# Patient Record
Sex: Female | Born: 1960
Health system: Southern US, Community
[De-identification: ages and names within clinical notes are randomized; demographics above are authoritative.]

## PROBLEM LIST (undated history)

## (undated) DIAGNOSIS — K219 Gastro-esophageal reflux disease without esophagitis: Secondary | ICD-10-CM

## (undated) DIAGNOSIS — K449 Diaphragmatic hernia without obstruction or gangrene: Secondary | ICD-10-CM

## (undated) DIAGNOSIS — L309 Dermatitis, unspecified: Secondary | ICD-10-CM

## (undated) DIAGNOSIS — M1990 Unspecified osteoarthritis, unspecified site: Secondary | ICD-10-CM

## (undated) DIAGNOSIS — K269 Duodenal ulcer, unspecified as acute or chronic, without hemorrhage or perforation: Secondary | ICD-10-CM

## (undated) DIAGNOSIS — E039 Hypothyroidism, unspecified: Secondary | ICD-10-CM

## (undated) DIAGNOSIS — K2970 Gastritis, unspecified, without bleeding: Secondary | ICD-10-CM

## (undated) DIAGNOSIS — M797 Fibromyalgia: Secondary | ICD-10-CM

## (undated) DIAGNOSIS — Z1501 Genetic susceptibility to malignant neoplasm of breast: Secondary | ICD-10-CM

## (undated) DIAGNOSIS — Z87442 Personal history of urinary calculi: Secondary | ICD-10-CM

## (undated) DIAGNOSIS — R519 Headache, unspecified: Secondary | ICD-10-CM

## (undated) DIAGNOSIS — I10 Essential (primary) hypertension: Secondary | ICD-10-CM

## (undated) DIAGNOSIS — T8859XA Other complications of anesthesia, initial encounter: Secondary | ICD-10-CM

## (undated) DIAGNOSIS — L409 Psoriasis, unspecified: Secondary | ICD-10-CM

## (undated) DIAGNOSIS — I00 Rheumatic fever without heart involvement: Secondary | ICD-10-CM

## (undated) DIAGNOSIS — K589 Irritable bowel syndrome without diarrhea: Secondary | ICD-10-CM

## (undated) DIAGNOSIS — R011 Cardiac murmur, unspecified: Secondary | ICD-10-CM

## (undated) DIAGNOSIS — D649 Anemia, unspecified: Secondary | ICD-10-CM

## (undated) DIAGNOSIS — J45909 Unspecified asthma, uncomplicated: Secondary | ICD-10-CM

## (undated) DIAGNOSIS — G4733 Obstructive sleep apnea (adult) (pediatric): Secondary | ICD-10-CM

## (undated) DIAGNOSIS — K277 Chronic peptic ulcer, site unspecified, without hemorrhage or perforation: Secondary | ICD-10-CM

## (undated) DIAGNOSIS — J309 Allergic rhinitis, unspecified: Secondary | ICD-10-CM

## (undated) DIAGNOSIS — Z803 Family history of malignant neoplasm of breast: Secondary | ICD-10-CM

## (undated) DIAGNOSIS — M719 Bursopathy, unspecified: Secondary | ICD-10-CM

## (undated) DIAGNOSIS — C4491 Basal cell carcinoma of skin, unspecified: Secondary | ICD-10-CM

## (undated) DIAGNOSIS — T4145XA Adverse effect of unspecified anesthetic, initial encounter: Secondary | ICD-10-CM

## (undated) DIAGNOSIS — E119 Type 2 diabetes mellitus without complications: Secondary | ICD-10-CM

## (undated) HISTORY — PX: SKIN BIOPSY: SHX1

## (undated) HISTORY — DX: Basal cell carcinoma of skin, unspecified: C44.91

## (undated) HISTORY — DX: Hypothyroidism, unspecified: E03.9

## (undated) HISTORY — DX: Psoriasis, unspecified: L40.9

## (undated) HISTORY — PX: TONSILLECTOMY AND ADENOIDECTOMY: SUR1326

## (undated) HISTORY — DX: Obstructive sleep apnea (adult) (pediatric): G47.33

## (undated) HISTORY — DX: Gastro-esophageal reflux disease without esophagitis: K21.9

## (undated) HISTORY — DX: Duodenal ulcer, unspecified as acute or chronic, without hemorrhage or perforation: K26.9

## (undated) HISTORY — DX: Unspecified osteoarthritis, unspecified site: M19.90

## (undated) HISTORY — DX: Fibromyalgia: M79.7

## (undated) HISTORY — DX: Gastritis, unspecified, without bleeding: K29.70

## (undated) HISTORY — DX: Unspecified asthma, uncomplicated: J45.909

## (undated) HISTORY — DX: Diaphragmatic hernia without obstruction or gangrene: K44.9

## (undated) HISTORY — DX: Irritable bowel syndrome, unspecified: K58.9

## (undated) HISTORY — DX: Allergic rhinitis, unspecified: J30.9

## (undated) HISTORY — PX: SKIN CANCER EXCISION: SHX779

## (undated) HISTORY — DX: Essential (primary) hypertension: I10

## (undated) HISTORY — PX: APPENDECTOMY: SHX54

## (undated) HISTORY — DX: Bursopathy, unspecified: M71.9

## (undated) HISTORY — PX: CHOLECYSTECTOMY: SHX55

## (undated) HISTORY — PX: TONSILECTOMY, ADENOIDECTOMY, BILATERAL MYRINGOTOMY AND TUBES: SHX2538

## (undated) HISTORY — DX: Family history of malignant neoplasm of breast: Z80.3

---
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
## 1997-07-10 DIAGNOSIS — C4491 Basal cell carcinoma of skin, unspecified: Secondary | ICD-10-CM

## 1997-07-10 HISTORY — DX: Basal cell carcinoma of skin, unspecified: C44.91

## 2005-01-21 ENCOUNTER — Ambulatory Visit: Payer: Self-pay | Admitting: Family Medicine

## 2005-02-03 ENCOUNTER — Ambulatory Visit: Payer: Self-pay | Admitting: Family Medicine

## 2005-06-09 HISTORY — PX: BREAST BIOPSY: SHX20

## 2005-07-16 ENCOUNTER — Ambulatory Visit: Payer: Self-pay | Admitting: General Surgery

## 2006-02-10 ENCOUNTER — Ambulatory Visit: Payer: Self-pay | Admitting: General Surgery

## 2006-11-08 ENCOUNTER — Inpatient Hospital Stay: Payer: Self-pay | Admitting: Internal Medicine

## 2007-06-10 DIAGNOSIS — J189 Pneumonia, unspecified organism: Secondary | ICD-10-CM

## 2007-06-10 HISTORY — DX: Pneumonia, unspecified organism: J18.9

## 2008-03-21 ENCOUNTER — Ambulatory Visit: Payer: Self-pay | Admitting: Obstetrics and Gynecology

## 2009-04-02 ENCOUNTER — Ambulatory Visit: Payer: Self-pay | Admitting: Internal Medicine

## 2009-04-30 ENCOUNTER — Ambulatory Visit: Payer: Self-pay | Admitting: Internal Medicine

## 2009-05-09 ENCOUNTER — Ambulatory Visit: Payer: Self-pay | Admitting: Internal Medicine

## 2009-07-02 ENCOUNTER — Emergency Department (HOSPITAL_COMMUNITY): Admission: EM | Admit: 2009-07-02 | Discharge: 2009-07-03 | Payer: Self-pay | Admitting: Emergency Medicine

## 2009-07-04 ENCOUNTER — Ambulatory Visit: Payer: Self-pay | Admitting: Internal Medicine

## 2010-08-26 LAB — DIFFERENTIAL
Basophils Relative: 1 % (ref 0–1)
Eosinophils Absolute: 0.1 10*3/uL (ref 0.0–0.7)
Neutrophils Relative %: 74 % (ref 43–77)

## 2010-08-26 LAB — URINALYSIS, ROUTINE W REFLEX MICROSCOPIC
Ketones, ur: NEGATIVE mg/dL
Nitrite: NEGATIVE
Protein, ur: 100 mg/dL — AB
Specific Gravity, Urine: 1.018 (ref 1.005–1.030)

## 2010-08-26 LAB — POCT I-STAT, CHEM 8
BUN: 9 mg/dL (ref 6–23)
Hemoglobin: 13.6 g/dL (ref 12.0–15.0)
Potassium: 3.7 mEq/L (ref 3.5–5.1)
Sodium: 136 mEq/L (ref 135–145)
TCO2: 28 mmol/L (ref 0–100)

## 2010-08-26 LAB — URINE CULTURE

## 2010-08-26 LAB — CBC
MCV: 92.8 fL (ref 78.0–100.0)
Platelets: 381 10*3/uL (ref 150–400)
WBC: 17.2 10*3/uL — ABNORMAL HIGH (ref 4.0–10.5)

## 2010-08-26 LAB — URINE MICROSCOPIC-ADD ON

## 2011-04-28 ENCOUNTER — Ambulatory Visit: Payer: Self-pay | Admitting: Internal Medicine

## 2011-06-11 LAB — HM MAMMOGRAPHY: HM MAMMO: NORMAL

## 2011-07-25 ENCOUNTER — Ambulatory Visit: Payer: Self-pay | Admitting: Unknown Physician Specialty

## 2011-09-08 DIAGNOSIS — J3089 Other allergic rhinitis: Secondary | ICD-10-CM | POA: Insufficient documentation

## 2011-09-09 ENCOUNTER — Ambulatory Visit: Payer: Self-pay | Admitting: Internal Medicine

## 2011-09-30 DIAGNOSIS — L309 Dermatitis, unspecified: Secondary | ICD-10-CM

## 2011-09-30 HISTORY — DX: Dermatitis, unspecified: L30.9

## 2011-11-05 ENCOUNTER — Ambulatory Visit: Payer: Self-pay | Admitting: Internal Medicine

## 2012-02-23 ENCOUNTER — Inpatient Hospital Stay: Payer: Self-pay | Admitting: Internal Medicine

## 2012-02-23 LAB — COMPREHENSIVE METABOLIC PANEL
Anion Gap: 9 (ref 7–16)
Calcium, Total: 9.3 mg/dL (ref 8.5–10.1)
Chloride: 101 mmol/L (ref 98–107)
EGFR (African American): >60
Osmolality: 274 (ref 275–301)
Potassium: 3.7 mmol/L (ref 3.5–5.1)
SGOT(AST): 17 U/L (ref 15–37)
Sodium: 137 mmol/L (ref 136–145)

## 2012-02-23 LAB — CBC
HCT: 40.8 % (ref 35.0–47.0)
MCH: 31.8 pg (ref 26.0–34.0)
MCV: 92 fL (ref 80–100)
RBC: 4.42 10*6/uL (ref 3.80–5.20)
WBC: 17.7 10*3/uL — ABNORMAL HIGH (ref 3.6–11.0)

## 2012-02-23 LAB — URINALYSIS, COMPLETE
Glucose,UR: NEGATIVE mg/dL (ref 0–75)
Ketone: NEGATIVE
Specific Gravity: 1.005 (ref 1.003–1.030)
Squamous Epithelial: <1
WBC UR: 279 /HPF (ref 0–5)

## 2012-02-23 LAB — TSH: Thyroid Stimulating Horm: 3.38 u[IU]/mL

## 2012-02-24 LAB — BASIC METABOLIC PANEL
Calcium, Total: 8.2 mg/dL — ABNORMAL LOW (ref 8.5–10.1)
EGFR (African American): >60
EGFR (Non-African Amer.): >60
Osmolality: 280 (ref 275–301)
Sodium: 141 mmol/L (ref 136–145)

## 2012-02-24 LAB — CBC WITH DIFFERENTIAL/PLATELET
Basophil %: 1.1 %
Eosinophil %: 2.1 %
HCT: 36.2 % (ref 35.0–47.0)
HGB: 12.3 g/dL (ref 12.0–16.0)
Lymphocyte #: 3.8 10*3/uL — ABNORMAL HIGH (ref 1.0–3.6)
MCV: 94 fL (ref 80–100)
Monocyte %: 5.6 %
Neutrophil #: 9.2 10*3/uL — ABNORMAL HIGH (ref 1.4–6.5)
RBC: 3.85 10*6/uL (ref 3.80–5.20)
RDW: 13.5 % (ref 11.5–14.5)
WBC: 14.3 10*3/uL — ABNORMAL HIGH (ref 3.6–11.0)

## 2012-02-25 LAB — BASIC METABOLIC PANEL
Anion Gap: 7 (ref 7–16)
BUN: 5 mg/dL — ABNORMAL LOW (ref 7–18)
Chloride: 106 mmol/L (ref 98–107)
Co2: 28 mmol/L (ref 21–32)
EGFR (Non-African Amer.): >60
Osmolality: 279 (ref 275–301)
Potassium: 4.1 mmol/L (ref 3.5–5.1)

## 2012-02-25 LAB — CBC WITH DIFFERENTIAL/PLATELET
Basophil #: 0.1 10*3/uL (ref 0.0–0.1)
Eosinophil #: 0.4 10*3/uL (ref 0.0–0.7)
Lymphocyte #: 4 10*3/uL — ABNORMAL HIGH (ref 1.0–3.6)
MCH: 30.9 pg (ref 26.0–34.0)
MCHC: 32.9 g/dL (ref 32.0–36.0)
MCV: 94 fL (ref 80–100)
Monocyte #: 0.5 x10 3/mm (ref 0.2–0.9)
Platelet: 316 10*3/uL (ref 150–440)
RDW: 13.7 % (ref 11.5–14.5)
WBC: 9.5 10*3/uL (ref 3.6–11.0)

## 2012-02-26 LAB — URINE CULTURE

## 2012-04-14 ENCOUNTER — Other Ambulatory Visit: Payer: Self-pay | Admitting: Internal Medicine

## 2012-04-14 LAB — CBC WITH DIFFERENTIAL/PLATELET
Basophil %: 1 %
Eosinophil #: 0.5 10*3/uL (ref 0.0–0.7)
Eosinophil %: 4.2 %
HCT: 40.5 % (ref 35.0–47.0)
HGB: 13.4 g/dL (ref 12.0–16.0)
Lymphocyte %: 32.7 %
MCH: 31 pg (ref 26.0–34.0)
MCHC: 33.1 g/dL (ref 32.0–36.0)
MCV: 94 fL (ref 80–100)
Neutrophil %: 57.8 %
Platelet: 400 10*3/uL (ref 150–440)
RBC: 4.33 10*6/uL (ref 3.80–5.20)
WBC: 11.5 10*3/uL — ABNORMAL HIGH (ref 3.6–11.0)

## 2012-04-14 LAB — LIPID PANEL
Cholesterol: 187 mg/dL (ref 0–200)
HDL Cholesterol: 51 mg/dL (ref 40–60)
Triglycerides: 171 mg/dL (ref 0–200)
VLDL Cholesterol, Calc: 34 mg/dL (ref 5–40)

## 2012-06-10 LAB — HM COLONOSCOPY: HM COLON: NORMAL

## 2012-07-09 ENCOUNTER — Ambulatory Visit: Payer: Self-pay | Admitting: Internal Medicine

## 2012-07-19 DIAGNOSIS — R079 Chest pain, unspecified: Secondary | ICD-10-CM | POA: Insufficient documentation

## 2012-07-19 HISTORY — DX: Chest pain, unspecified: R07.9

## 2013-03-07 ENCOUNTER — Emergency Department: Payer: Self-pay | Admitting: Emergency Medicine

## 2013-03-07 LAB — HEPATIC FUNCTION PANEL A (ARMC)
SGOT(AST): 21 U/L (ref 15–37)
SGPT (ALT): 26 U/L (ref 12–78)
Total Protein: 7.6 g/dL (ref 6.4–8.2)

## 2013-03-07 LAB — BASIC METABOLIC PANEL
Anion Gap: 5 — ABNORMAL LOW (ref 7–16)
BUN: 8 mg/dL (ref 7–18)
Calcium, Total: 9 mg/dL (ref 8.5–10.1)
Creatinine: 0.68 mg/dL (ref 0.60–1.30)
EGFR (African American): >60
Glucose: 131 mg/dL — ABNORMAL HIGH (ref 65–99)
Potassium: 3.8 mmol/L (ref 3.5–5.1)

## 2013-03-07 LAB — URINALYSIS, COMPLETE
Bilirubin,UR: NEGATIVE
Ketone: NEGATIVE
Leukocyte Esterase: NEGATIVE
Ph: 6 (ref 4.5–8.0)
Protein: NEGATIVE
RBC,UR: <1 /HPF (ref 0–5)
Specific Gravity: 1.002 (ref 1.003–1.030)
WBC UR: 1 /HPF (ref 0–5)

## 2013-03-07 LAB — CBC
HCT: 44 % (ref 35.0–47.0)
MCH: 31.4 pg (ref 26.0–34.0)
MCHC: 33.7 g/dL (ref 32.0–36.0)
Platelet: 376 10*3/uL (ref 150–440)
RBC: 4.73 10*6/uL (ref 3.80–5.20)
RDW: 13.7 % (ref 11.5–14.5)

## 2013-03-07 LAB — LIPASE, BLOOD: Lipase: 82 U/L (ref 73–393)

## 2013-08-21 ENCOUNTER — Emergency Department: Payer: Self-pay | Admitting: Emergency Medicine

## 2013-08-21 LAB — CBC
HCT: 42.5 % (ref 35.0–47.0)
HGB: 13.6 g/dL (ref 12.0–16.0)
MCH: 30.4 pg (ref 26.0–34.0)
MCHC: 32.1 g/dL (ref 32.0–36.0)
MCV: 95 fL (ref 80–100)
Platelet: 362 10*3/uL (ref 150–440)
RBC: 4.48 10*6/uL (ref 3.80–5.20)
RDW: 13.8 % (ref 11.5–14.5)
WBC: 11.1 10*3/uL — ABNORMAL HIGH (ref 3.6–11.0)

## 2013-08-21 LAB — BASIC METABOLIC PANEL
ANION GAP: 4 — AB (ref 7–16)
BUN: 7 mg/dL (ref 7–18)
CO2: 30 mmol/L (ref 21–32)
Calcium, Total: 8.7 mg/dL (ref 8.5–10.1)
Chloride: 104 mmol/L (ref 98–107)
Creatinine: 0.66 mg/dL (ref 0.60–1.30)
EGFR (African American): >60
GLUCOSE: 133 mg/dL — AB (ref 65–99)
OSMOLALITY: 276 (ref 275–301)
POTASSIUM: 3.7 mmol/L (ref 3.5–5.1)
SODIUM: 138 mmol/L (ref 136–145)

## 2013-08-21 LAB — TROPONIN I

## 2013-08-21 LAB — PRO B NATRIURETIC PEPTIDE: B-TYPE NATIURETIC PEPTID: 34 pg/mL (ref 0–125)

## 2014-05-19 LAB — LIPID PANEL
CHOLESTEROL: 205 mg/dL — AB (ref 0–200)
HDL: 50 mg/dL (ref 35–70)
LDL CALC: 117 mg/dL
TRIGLYCERIDES: 188 mg/dL — AB (ref 40–160)

## 2014-05-19 LAB — TSH: TSH: 5.09 u[IU]/mL (ref <=5.90)

## 2014-09-26 NOTE — Discharge Summary (Signed)
PATIENT NAME:  [name], [name] MR#:  [id number] DATE OF BIRTH:  May 27, 1961  DATE OF ADMISSION:  02/23/2012 DATE OF DISCHARGE:  02/26/2012  DISCHARGE DIAGNOSES:  1. Systemic inflammatory response syndrome, possibly due to acute pyelonephritis with urine culture growing Escherichia coli.  2. Escherichia coli urinary tract infection, improving on antibiotic.   SECONDARY DIAGNOSES:  1. History of diverticulosis.  2. Hiatal hernia.  3. Fibromyalgia.  4. Chronic pain syndrome. 5. Acid reflux.  6. Hypertension.  7. Hypothyroidism.  8. History of insomnia.  9. Asthma.  10. Morbid obesity.   CONSULTATIONS: None.   PROCEDURES/RADIOLOGY: None pending.   MAJOR LABORATORY PANEL: UA on admission showed 3+ bacteria, 279 WBCs, 3+ leukocyte esterase, positive nitrite, WBCs in clumps present.   Urine culture grew more than 100,000 colonies of Escherichia coli.   HISTORY AND SHORT HOSPITAL COURSE: Patient is a 54 year old female with above-mentioned medical problems who was admitted for SIRS thought to be secondary to acute pyelonephritis, was started on IV antibiotics. Please see Dr. Gus Height Patel's dictated History and Physical for further details. The patient was slowly improving on IV antibiotics. Did not have any further fever. Her white count had also normalized, and she was close to baseline and was discharged home in stable condition.    PERTINENT PHYSICAL EXAMINATION ON THE DATE OF DISCHARGE:   VITAL SIGNS: On the date of discharge her vital signs were as follows: Temperature 98, heart rate 87 per minute, respirations 20 per minute, blood pressure 123/76 mmHg. She was saturating 95% on room air.   CARDIOVASCULAR: S1, S2 normal. No murmurs, rubs, or gallop.   LUNGS: Clear to auscultation bilaterally. No wheezing, rales, rhonchi, crepitation.    ABDOMEN: Soft, benign.   NEUROLOGIC: Nonfocal examination. All other physical examination remained at baseline.   DISCHARGE MEDICATIONS:   1. Lisinopril 20 mg p.o. daily.  2. Levothyroxine 125 mcg p.o. daily.  3. Zyrtec once daily.  4. Vectical topical ointment to affected area twice a day.  5. Amlodipine 5 mg p.o. daily. 6. Aspirin 81 mg p.o. daily.  7. Flonase one spray to each nostril once a day.  8. Tramadol 50 mg p.o. every 4 hours as needed.  9. Nexium 40 mg p.o. daily.  10. Singulair once daily.  11. Amitriptyline 50 mg p.o. at bedtime. 12. FiberChoice once daily.  13. Flexeril 10 mg p.o. b.i.d.  14. NasalCrom 1 spray to nose 1 to 2 times a day. 15. Loperamide 2 mg p.o. daily as needed.  16. ProAir 2 puffs inhaled as needed.  17. Lasix 20 mg half tablet p.o. daily as needed.  18. Multivitamin once daily. 19. Elavil 50 mg p.o. at bedtime.  20. Cipro 500 mg p.o. b.i.d. for 10 days.   DISCHARGE DIET: Regular.   DISCHARGE ACTIVITY: As tolerated.   DISCHARGE INSTRUCTIONS AND FOLLOW-UP: Patient was instructed to follow up with her primary care physician, Dr. Valentino Saxon, in 1 to 2 weeks.   TOTAL TIME DISCHARGING THIS PATIENT: 55 minutes.     ____________________________ Lucina Mellow. Manuella Ghazi, MD vss:vtd D: 03/01/2012 10:08:30 ET T: 03/02/2012 13:29:48 ET JOB#: [id number]  cc: Hulda Reddix S. Manuella Ghazi, MD, <Dictator> Maureen P. Heber [place], MD Remer Macho MD ELECTRONICALLY SIGNED 03/02/2012 14:20

## 2014-09-26 NOTE — H&P (Signed)
PATIENT NAME:  Summer, ARALY KAAS MR#:  [id number] DATE OF BIRTH:  06/24/1960  DATE OF ADMISSION:  02/23/2012  PRIMARY CARE PHYSICIAN: Valentino Saxon, MD   CHIEF COMPLAINT: Urinary frequency, urgency, suprapubic abdominal pain, nausea, and fever for three days.   HISTORY OF PRESENT ILLNESS: Ms. Purewal is a 54 year old Caucasian female with history of fibromyalgia, hypothyroidism, hypertension, acid reflux, and asthma who presents to the Emergency Room with fever at home. She was found to be tachycardic with heart rate in the 115's, was very nauseous and had significant abdominal pain. Her urine looked very cloudy with positive WBCs, leukocyte esterase, and nitrites. She is being admitted for acute pyelonephritis. The patient has significant nausea, retching on arrival to the Emergency Room. She received 1 gram of Rocephin, IV Dilaudid, and IV Zofran. The patient is being admitted for further evaluation and management. Her white count is 17,000.   PAST MEDICAL HISTORY:  1. History of diverticulosis by colonoscopy done in February of 2013.  2. Hiatus hernia with gastritis noted on EGD again in February of 2013.  3. Fibromyalgia.  4. Chronic pain syndrome.  5. Acid reflux/gastritis.  6. Hypertension.  7. Hypothyroidism.  8. History of insomnia.  9. Asthma.  10. Morbid obesity.   HOME MEDICATIONS:  1. Zyrtec Liquid Gels 10 mg 1 p.o. daily.  2. Vectical 3 mcg/g topical ointment apply to affected area b.i.d.  3. Tramadol 50 mg 1 tablet q.4 p.r.n.  4. Singulair 10 mg once a day in the evening.  5. ProAir HFA 2 puffs as needed p.r.n.  6. Nexium 40 mg p.o. daily.  7. NasalCrom 5.2 mg/inhalation 1 to 2 times.  8. Multivitamin 1 tablet once a day.  9. Loperamide 2 mg 1 capsule as needed.  10. Lisinopril 20 mg once a day.  11. Levothyroxine 125 mcg one tablet once a day.  12. Lasix 20 mg half a tablet daily as needed.  13. Fluticasone 50 mcg/inhalation one spray nasally once a day.   14. FiberChoice 1 capsule daily.  15. Cyclobenzaprine 10 mg b.i.d.  16. Aspirin 81 mg daily.  17. Amlodipine 5 mg daily.  18. Amitriptyline 50 mg daily at bedtime.    ALLERGIES: Hydrochlorothiazide and sulfa.   FAMILY HISTORY: Positive for kidney stones in father.   SOCIAL HISTORY: Does not work. Is in the process of applying for disability. Nonsmoker. Nonalcoholic.   REVIEW OF SYSTEMS: CONSTITUTIONAL: Positive for fatigue and weakness and low-grade fever at home. EYES: No blurred or double vision. No cataracts. ENT: No tinnitus, ear pain, hearing loss. RESPIRATORY: No cough, wheeze, hemoptysis. CARDIOVASCULAR: No chest pain, orthopnea, edema, arrhythmias. GI: Positive for nausea and abdominal pain. Positive for gastroesophageal reflux disease. GU: Positive for dysuria, frequency, and urgency. ENDOCRINE: No polyuria, nocturia, or thyroid problems. HEMATOLOGY: No anemia or easy bruising. SKIN: No acne or rash. MUSCULOSKELETAL: Positive for arthritis and fibromyalgia. NEUROLOGIC: No CVA or TIA. PSYCH: No anxiety or depression. All other systems reviewed and negative.   PHYSICAL EXAMINATION:   GENERAL: The patient is awake, alert, oriented x3 not in acute distress.   VITAL SIGNS: Afebrile, pulse 112 to 115, respirations 16, blood pressure 126/79, pulse oximetry 92% on room air.   HEENT: Atraumatic, normocephalic. Pupils equal, round, and reactive to light and accommodation. Extraocular movements intact. Oral mucosa is moist.   NECK: Supple. No JVD. No carotid bruit.   RESPIRATORY: Clear to auscultation bilaterally. No rales, rhonchi, respiratory distress, or labored breathing.   CARDIOVASCULAR: Both the heart  sounds are normal. Tachycardia present. No murmur heard. PMI not lateralized. Chest nontender. Good peripheral pulses. Good femoral pulses. No lower extremity edema.   ABDOMEN: Soft, benign. There is some tenderness present in the right and left lower quadrants. No guarding, rigidity,  or mass felt. Bowel sounds active at present.   NEUROLOGIC: Grossly intact cranial nerves II through XII. No motor or sensory deficit.   EXTREMITIES: Good pedal pulses. Good femoral pulses. No lower extremity edema.   SKIN: Warm and dry.   LABORATORY, DIAGNOSTIC, AND RADIOLOGICAL DATA: White count 17.7, hemoglobin and hematocrit 14.1 and 40.8, platelet count 438, glucose 137. Rest of the chemistries within normal limits. TSH 3.38. Urinalysis positive for UTI.   ASSESSMENT: 54 year old Ms. Coakley with history of hypertension, fibromyalgia, chronic pain syndrome, and hypothyroidism who presented with urgency, urine discoloration, abdominal pain and nausea with low-grade fever admitted with:  1. SIRS suspected due to acute pyelonephritis. The patient presented with low-grade fever, tachycardia, abdominal pain, leukocytosis, and significant nausea in the Emergency Room. She received IV Rocephin, Dilaudid, and IV Zofran. Will admit patient to the medical floor. Continue IV antibiotics. Follow blood culture, urine culture. Change antibiotics according to the culture and sensitivity. Continue p.r.n. Dilaudid and p.r.n. Zofran. Follow-up CBC.  2. Fibromyalgia with chronic pain. Continue the patient's home meds which is Elavil, p.r.n. tramadol, and Flexeril.  3. Leukocytosis secondary to SIRS due to pyelonephritis. Follow-up clinically and follow-up CBC as well.  4. Hypothyroidism. Continue Synthroid.  5. Hypertension. Will continue amlodipine and lisinopril.  6. DVT prophylaxis with DVT prophylaxis with Lovenox daily.  7. GERD/acid reflux. Continue Nexium daily.   Further work-up according to the patient's clinical course. Hospital admission plan was discussed with the patient who is agreeable to it. No family members present.  CODE STATUS: The patient is a FULL CODE.   TIME SPENT: 45 minutes.   ____________________________ Hart Rochester Posey Pronto, MD sap:drc D: 02/23/2012 22:05:02 ET T: 02/24/2012  07:02:11 ET JOB#: [id number]  cc: Alfonsa Vaile A. Posey Pronto, MD, <Dictator> Maureen P. Heber [place], MD Ilda Basset MD ELECTRONICALLY SIGNED 02/26/2012 13:24

## 2014-10-27 ENCOUNTER — Other Ambulatory Visit: Payer: Self-pay | Admitting: Internal Medicine

## 2014-10-27 DIAGNOSIS — Z1231 Encounter for screening mammogram for malignant neoplasm of breast: Secondary | ICD-10-CM

## 2014-11-09 ENCOUNTER — Ambulatory Visit
Admission: RE | Admit: 2014-11-09 | Discharge: 2014-11-09 | Disposition: A | Discharge status: Home / Self Care | Payer: 59 | Source: Ambulatory Visit | Attending: Internal Medicine | Admitting: Internal Medicine

## 2014-11-09 DIAGNOSIS — R922 Inconclusive mammogram: Secondary | ICD-10-CM | POA: Diagnosis not present

## 2014-11-09 DIAGNOSIS — Z1231 Encounter for screening mammogram for malignant neoplasm of breast: Secondary | ICD-10-CM | POA: Diagnosis not present

## 2014-11-10 ENCOUNTER — Encounter: Payer: Self-pay | Admitting: Internal Medicine

## 2014-11-10 ENCOUNTER — Other Ambulatory Visit: Payer: Self-pay | Admitting: Internal Medicine

## 2014-11-10 DIAGNOSIS — E039 Hypothyroidism, unspecified: Secondary | ICD-10-CM | POA: Insufficient documentation

## 2014-11-10 DIAGNOSIS — E782 Mixed hyperlipidemia: Secondary | ICD-10-CM | POA: Insufficient documentation

## 2014-11-10 DIAGNOSIS — J4520 Mild intermittent asthma, uncomplicated: Secondary | ICD-10-CM | POA: Insufficient documentation

## 2014-11-10 DIAGNOSIS — M797 Fibromyalgia: Secondary | ICD-10-CM | POA: Insufficient documentation

## 2014-11-10 DIAGNOSIS — G4733 Obstructive sleep apnea (adult) (pediatric): Secondary | ICD-10-CM | POA: Insufficient documentation

## 2014-11-10 DIAGNOSIS — K277 Chronic peptic ulcer, site unspecified, without hemorrhage or perforation: Secondary | ICD-10-CM | POA: Insufficient documentation

## 2014-11-10 DIAGNOSIS — L409 Psoriasis, unspecified: Secondary | ICD-10-CM | POA: Insufficient documentation

## 2014-11-10 DIAGNOSIS — Z6841 Body Mass Index (BMI) 40.0 and over, adult: Secondary | ICD-10-CM | POA: Insufficient documentation

## 2014-11-10 DIAGNOSIS — K580 Irritable bowel syndrome with diarrhea: Secondary | ICD-10-CM | POA: Insufficient documentation

## 2014-11-10 DIAGNOSIS — I10 Essential (primary) hypertension: Secondary | ICD-10-CM | POA: Insufficient documentation

## 2015-01-23 ENCOUNTER — Telehealth: Payer: Self-pay

## 2015-01-23 ENCOUNTER — Other Ambulatory Visit: Payer: Self-pay

## 2015-01-23 NOTE — Telephone Encounter (Signed)
Patient needs refill on Cymbalta and should be 30mg  3 tablets daily, you increased it to 90mg  but sent only one a day, needs 90 days to [facility]. Also needs ProAir HFA inhaler sent.  dr

## 2015-01-24 ENCOUNTER — Other Ambulatory Visit: Payer: Self-pay | Admitting: Internal Medicine

## 2015-01-24 MED ORDER — DULOXETINE HCL 30 MG PO CPEP: 90.0000 mg | ORAL_CAPSULE | Freq: Every day | ORAL | Status: DC

## 2015-01-24 MED ORDER — ALBUTEROL SULFATE HFA 108 (90 BASE) MCG/ACT IN AERS: 2.0000 | INHALATION_SPRAY | Freq: Four times a day (QID) | RESPIRATORY_TRACT | Status: DC | PRN

## 2015-03-19 ENCOUNTER — Ambulatory Visit (INDEPENDENT_AMBULATORY_CARE_PROVIDER_SITE_OTHER): Payer: 59 | Admitting: Internal Medicine

## 2015-03-19 ENCOUNTER — Encounter: Payer: Self-pay | Admitting: Internal Medicine

## 2015-03-19 VITALS — BP 150/92 | HR 80 | Ht 61.0 in | Wt 238.4 lb

## 2015-03-19 DIAGNOSIS — M791 Myalgia: Secondary | ICD-10-CM

## 2015-03-19 DIAGNOSIS — IMO0001 Reserved for inherently not codable concepts without codable children: Secondary | ICD-10-CM

## 2015-03-19 DIAGNOSIS — I10 Essential (primary) hypertension: Secondary | ICD-10-CM

## 2015-03-19 DIAGNOSIS — M609 Myositis, unspecified: Secondary | ICD-10-CM

## 2015-03-19 DIAGNOSIS — Z23 Encounter for immunization: Secondary | ICD-10-CM | POA: Diagnosis not present

## 2015-03-19 DIAGNOSIS — S2001XA Contusion of right breast, initial encounter: Secondary | ICD-10-CM

## 2015-03-19 DIAGNOSIS — K580 Irritable bowel syndrome with diarrhea: Secondary | ICD-10-CM | POA: Diagnosis not present

## 2015-03-19 MED ORDER — DULOXETINE HCL 30 MG PO CPEP: 90.0000 mg | ORAL_CAPSULE | Freq: Every day | ORAL | Status: DC

## 2015-03-19 MED ORDER — CYCLOBENZAPRINE HCL 5 MG PO TABS: 15.0000 mg | ORAL_TABLET | Freq: Two times a day (BID) | ORAL | Status: DC

## 2015-03-19 NOTE — Progress Notes (Signed)
Date:  03/19/2015   Name:  [name]   DOB:  1960-09-03   MRN:  [id number]   Chief Complaint: Breast Problem HPI  Patient reports 3 weeks ago her cat jumped onto her right breast. The next day with a hematoma about the size of a silver dollar. In addition she felt a mass under the hematoma. The hematoma has now resolved but the mass is persistent. She denies pain, increase in mass size, nipple discharge, or skin change. Her last mammogram was in June and was normal. Her mother had breast cancer. IBS with diarrhea -this is been a long-standing issue that was significantly impacting her quality of life. Last visit she was prescribed cholestyramine which she says this made her symptoms much more manageable. She feels as if she can go out now and asked to do activities. She is given up the adult diapers which has made her quite happy. Fibromyalgia - symptoms are stable on duloxetine 90 mg and Flexeril 15 mg twice a day. However she needs new prescriptions with appropriate directions. He would like to get a 90 day supply as well.  Review of Systems:  Review of Systems  Constitutional: Positive for fatigue. Negative for fever and unexpected weight change.  Respiratory: Negative for chest tightness and shortness of breath.   Cardiovascular: Negative for chest pain and palpitations.  Gastrointestinal: Positive for diarrhea. Negative for abdominal pain and constipation.  Genitourinary:       Right breast mass  Musculoskeletal: Positive for myalgias.  Skin: Positive for color change. Negative for rash.  Psychiatric/Behavioral: Positive for dysphoric mood.    Patient Active Problem List   Diagnosis Date Noted  . Acquired hypothyroidism 11/10/2014  . Essential (primary) hypertension 11/10/2014  . Myalgia and myositis 11/10/2014  . HLD (hyperlipidemia) 11/10/2014  . Irritable bowel syndrome with diarrhea 11/10/2014  . Asthma, mild intermittent 11/10/2014  . Extreme obesity ([place]) 11/10/2014    . Chronic peptic ulcer 11/10/2014  . Psoriasis 11/10/2014  . Obstructive apnea 11/10/2014  . Chest pain 07/19/2012  . Dermatitis, eczematoid 09/30/2011  . Allergic rhinitis 09/08/2011    Prior to Admission medications   Medication Sig Start Date End Date Taking? Authorizing Provider  albuterol (PROVENTIL HFA;VENTOLIN HFA) 108 (90 BASE) MCG/ACT inhaler Inhale 2 puffs into the lungs 4 (four) times daily as needed. 01/24/15  Yes Glean Hess, MD  aspirin 81 MG EC tablet Take 1 tablet by mouth daily.   Yes Historical Provider, MD  Cetirizine HCl 10 MG CAPS Take 1 capsule by mouth daily.   Yes Historical Provider, MD  cholestyramine light (PREVALITE) 4 G packet Take 1 packet by mouth daily. 10/30/14  Yes Historical Provider, MD  cyclobenzaprine (FLEXERIL) 5 MG tablet Take 1 tablet by mouth 3 (three) times daily as needed. 10/30/14  Yes Historical Provider, MD  doxazosin (CARDURA) 1 MG tablet Take 1 tablet by mouth daily. 05/19/14  Yes Historical Provider, MD  doxepin (SINEQUAN) 100 MG capsule Take 1 capsule by mouth at bedtime. 05/19/14  Yes Historical Provider, MD  DULoxetine (CYMBALTA) 30 MG capsule Take 3 capsules (90 mg total) by mouth daily. 01/24/15  Yes Glean Hess, MD  esomeprazole (NEXIUM) 40 MG capsule Take 1 capsule by mouth daily. 10/16/14  Yes Historical Provider, MD  fluticasone (FLONASE) 50 MCG/ACT nasal spray Place 2 sprays into the nose daily.   Yes Historical Provider, MD  levothyroxine (SYNTHROID, LEVOTHROID) 137 MCG tablet Take 1 tablet by mouth daily. 10/30/14  Yes Historical Provider,  MD  lisinopril (PRINIVIL,ZESTRIL) 40 MG tablet Take 1 tablet by mouth daily. 05/19/14  Yes Historical Provider, MD  loperamide (IMODIUM A-D) 2 MG tablet Take 1 tablet by mouth 3 (three) times daily as needed.   Yes Historical Provider, MD  meloxicam (MOBIC) 15 MG tablet Take 1 tablet by mouth daily. 05/19/14  Yes Historical Provider, MD  montelukast (SINGULAIR) 10 MG tablet Take 1 tablet by  mouth daily. 05/19/14  Yes Historical Provider, MD  nebivolol (BYSTOLIC) 10 MG tablet Take 1 tablet by mouth daily. 05/19/14  Yes Historical Provider, MD  traMADol (ULTRAM) 50 MG tablet TAKE 1 TABLET BY MOUTH TWICE DAILY AS NEEDED FOR SEVERE PAIN 11/10/14  Yes Glean Hess, MD  Astrid Drafts Omega-3 300 MG CAPS Take 1 capsule by mouth daily.    Historical Provider, MD  Multiple Vitamins-Iron (MULTIPLE VITAMIN/IRON PO) Take by mouth.    Historical Provider, MD    Allergies  Allergen Reactions  . Amlodipine Swelling    Leg swelling  . Furosemide Swelling  . Lyrica [Pregabalin] Other (See Comments)    Dizziness and blurred vision  . Sulfa Antibiotics Other (See Comments)    seizure  . Hydrochlorothiazide Itching and Rash    Past Surgical History  Procedure Laterality Date  . Breast biopsy Left 2007  . Appendectomy    . Cholecystectomy    . Skin cancer excision      BCCA of face    Social History  Substance Use Topics  . Smoking status: Never Smoker   . Smokeless tobacco: None  . Alcohol Use: 1.2 oz/week    2 Standard drinks or equivalent per week     Medication list has been reviewed and updated.  Physical Examination:  Physical Exam  Constitutional: She is oriented to person, place, and time. She appears well-developed and well-nourished.  Cardiovascular: Normal rate, regular rhythm and normal heart sounds.   Pulmonary/Chest: Effort normal and breath sounds normal. Right breast exhibits mass and skin change. Right breast exhibits no nipple discharge and no tenderness. Left breast exhibits no mass, no nipple discharge, no skin change and no tenderness.    Neurological: She is alert and oriented to person, place, and time.  Psychiatric: She has a normal mood and affect.    BP 180/110 mmHg  Pulse 80  Ht 5\' 1"  (1.549 m)  Wt 238 lb 6.4 oz (108.138 kg)  BMI 45.07 kg/m2  Assessment and Plan: 1. Posttraumatic hematoma of breast, right, initial encounter Suspect this is  all hematoma but we will wait a couple more weeks to see if it resolves. Patient will check it regularly and if not improving after 2 weeks will call for diagnostic mammogram and ultrasound of the right breast  2. Irritable bowel syndrome with diarrhea Much improved with cholestyramine  3. Flu vaccine need - Flu Vaccine QUAD 36+ mos PF IM (Fluarix & Fluzone Quad PF)  4. Myalgia and myositis Refill medications - DULoxetine (CYMBALTA) 30 MG capsule; Take 3 capsules (90 mg total) by mouth daily.  Dispense: 270 capsule; Refill: 3 - cyclobenzaprine (FLEXERIL) 5 MG tablet; Take 3 tablets (15 mg total) by mouth 2 (two) times daily.  Dispense: 540 tablet; Refill: 3  5. Essential (primary) hypertension Not well-controlled today Patient will continue current therapy and obtain an appropriate sized cuff for home checks Recheck in 3 months and adjust medications at that time if needed   Halina Maidens, MD [facility]  03/19/2015

## 2015-03-30 ENCOUNTER — Telehealth: Payer: Self-pay

## 2015-03-30 ENCOUNTER — Other Ambulatory Visit: Payer: Self-pay | Admitting: Internal Medicine

## 2015-03-30 DIAGNOSIS — N6310 Unspecified lump in the right breast, unspecified quadrant: Secondary | ICD-10-CM

## 2015-03-30 NOTE — Telephone Encounter (Signed)
Patient states she needs to go have lump in breast checked, mammogram ? [place] ? Needs done next week because she will be out of town after next week. dr

## 2015-04-13 ENCOUNTER — Other Ambulatory Visit: Payer: 59

## 2015-04-13 ENCOUNTER — Ambulatory Visit: Payer: 59

## 2015-04-17 ENCOUNTER — Ambulatory Visit
Admission: RE | Admit: 2015-04-17 | Discharge: 2015-04-17 | Disposition: A | Discharge status: Home / Self Care | Payer: 59 | Source: Ambulatory Visit | Attending: Internal Medicine | Admitting: Internal Medicine

## 2015-04-17 ENCOUNTER — Other Ambulatory Visit: Payer: Self-pay | Admitting: Internal Medicine

## 2015-04-17 DIAGNOSIS — N6310 Unspecified lump in the right breast, unspecified quadrant: Secondary | ICD-10-CM

## 2015-04-17 DIAGNOSIS — N63 Unspecified lump in breast: Secondary | ICD-10-CM | POA: Insufficient documentation

## 2015-06-11 ENCOUNTER — Other Ambulatory Visit: Payer: Self-pay | Admitting: Internal Medicine

## 2015-06-19 ENCOUNTER — Encounter: Payer: Self-pay | Admitting: Internal Medicine

## 2015-06-19 ENCOUNTER — Ambulatory Visit: Payer: 59 | Admitting: Internal Medicine

## 2015-06-26 ENCOUNTER — Other Ambulatory Visit: Payer: Self-pay | Admitting: Internal Medicine

## 2015-07-09 NOTE — Telephone Encounter (Signed)
Left several messages and no return calls

## 2015-07-20 ENCOUNTER — Other Ambulatory Visit: Payer: Self-pay | Admitting: Internal Medicine

## 2015-07-30 ENCOUNTER — Ambulatory Visit: Payer: 59 | Admitting: Internal Medicine

## 2015-07-30 ENCOUNTER — Other Ambulatory Visit: Payer: Self-pay | Admitting: Internal Medicine

## 2015-07-30 MED ORDER — LISINOPRIL 40 MG PO TABS: 40.0000 mg | ORAL_TABLET | Freq: Every day | ORAL | Status: DC

## 2015-07-30 MED ORDER — NEBIVOLOL HCL 10 MG PO TABS: 10.0000 mg | ORAL_TABLET | Freq: Every day | ORAL | Status: DC

## 2015-07-31 ENCOUNTER — Encounter: Payer: Self-pay | Admitting: Internal Medicine

## 2015-07-31 ENCOUNTER — Ambulatory Visit (INDEPENDENT_AMBULATORY_CARE_PROVIDER_SITE_OTHER): Payer: 59 | Admitting: Internal Medicine

## 2015-07-31 VITALS — BP 158/100 | HR 80 | Ht 61.0 in | Wt 237.8 lb

## 2015-07-31 DIAGNOSIS — M609 Myositis, unspecified: Secondary | ICD-10-CM | POA: Diagnosis not present

## 2015-07-31 DIAGNOSIS — N63 Unspecified lump in breast: Secondary | ICD-10-CM

## 2015-07-31 DIAGNOSIS — I10 Essential (primary) hypertension: Secondary | ICD-10-CM

## 2015-07-31 DIAGNOSIS — IMO0001 Reserved for inherently not codable concepts without codable children: Secondary | ICD-10-CM

## 2015-07-31 DIAGNOSIS — L409 Psoriasis, unspecified: Secondary | ICD-10-CM | POA: Diagnosis not present

## 2015-07-31 DIAGNOSIS — J4520 Mild intermittent asthma, uncomplicated: Secondary | ICD-10-CM | POA: Diagnosis not present

## 2015-07-31 DIAGNOSIS — N6310 Unspecified lump in the right breast, unspecified quadrant: Secondary | ICD-10-CM

## 2015-07-31 DIAGNOSIS — M791 Myalgia: Secondary | ICD-10-CM | POA: Diagnosis not present

## 2015-07-31 MED ORDER — BUDESONIDE-FORMOTEROL FUMARATE 160-4.5 MCG/ACT IN AERO: 1.0000 | INHALATION_SPRAY | Freq: Two times a day (BID) | RESPIRATORY_TRACT | Status: DC

## 2015-07-31 MED ORDER — DOXAZOSIN MESYLATE 2 MG PO TABS: 1.0000 mg | ORAL_TABLET | Freq: Every day | ORAL | Status: DC

## 2015-07-31 NOTE — Progress Notes (Signed)
Date:  07/31/2015   Name:  [name]   DOB:  [birth year]   MRN:  [id number]   Chief Complaint: Hypertension and Breast Mass Hypertension This is a chronic problem. The problem is resistant. Associated symptoms include anxiety, chest pain, headaches and shortness of breath. Past treatments include beta blockers, ACE inhibitors and alpha 1 blockers. The current treatment provides mild improvement. Compliance problems: has been out of meds for one week.   Asthma She complains of chest tightness, cough, shortness of breath and wheezing. This is a recurrent problem. The problem has been waxing and waning (worse over the past few months). The cough is non-productive. Associated symptoms include chest pain, headaches, myalgias and postnasal drip. Pertinent negatives include no fever or trouble swallowing. Her symptoms are alleviated by ipratropium, beta-agonist and leukotriene antagonist. Her past medical history is significant for asthma.   Breast mass - see below.  Due for repeat US.  No further pain or mass palpable per patient.    Study Result     CLINICAL DATA: 55 year old female with an area of bruising in the inner right breast approximately 2 months prior. The patient states the bruising has resolved, however she has a persistent palpable abnormality in this location. Denies any history of trauma.  EXAM: DIGITAL DIAGNOSTIC RIGHT MAMMOGRAM WITH 3D TOMOSYNTHESIS AND CAD  RIGHT BREAST ULTRASOUND  COMPARISON: Previous exam(s).  ACR Breast Density Category a: The breast tissue is almost entirely fatty.  FINDINGS: Cc and MLO tomosynthesis was performed of the right breast. There is a mixed echogenicity mass in the slightly upper inner right breast superficial depth at site of palpable concern. This measures approximately 1.2 cm.  Mammographic images were processed with CAD.  Physical examination at site of palpable concern in the upper inner right breast reveals  a firm nodule at the approximate 1 o'clock position.  Targeted ultrasound of the right breast was performed demonstrating a mixed echogenicity predominantly hyperechoic mass at 1 o'clock 5 cm from the nipple superficial depth. This measures approximately 2.2 x 0.7 x 1.5 cm and demonstrates imaging features suggestive of fat necrosis. This corresponds with mammography findings.  IMPRESSION: Probably benign right breast mass, likely related to prior trauma/fat necrosis.  RECOMMENDATION: Short term interval follow up in approximately 3 months is recommended to demonstrate improvement/resolution of the probably benign mass in the upper inner right breast. If this has decreased in size/ resolved at time of follow-up, then the patient may return to routine screening mammography.  I have discussed the findings and recommendations with the patient. Results were also provided in writing at the conclusion of the visit. If applicable, a reminder letter will be sent to the patient regarding the next appointment.  BI-RADS CATEGORY 3: Probably benign.   Electronically Signed  By: Everlean Alstrom M.D.  On: 04/17/2015 12:38   Fibromyalgia -  This is an on-going problem with body aches and fatigue.  She continues to gain weight.  She is considering weight watches but also wonders about bariatric surgery.  Review of Systems  Constitutional: Positive for fatigue and unexpected weight change. Negative for fever and chills.  HENT: Positive for congestion, postnasal drip and sinus pressure. Negative for tinnitus and trouble swallowing.   Respiratory: Positive for cough, shortness of breath and wheezing.   Cardiovascular: Positive for chest pain.  Gastrointestinal: Negative for abdominal pain, constipation and blood in stool.  Musculoskeletal: Positive for myalgias and arthralgias.  Skin: Positive for rash (psoriasis on hands and ear canals).  Neurological: Positive for headaches.  Negative for tremors and syncope.  Hematological: Negative for adenopathy.  Psychiatric/Behavioral: Positive for dysphoric mood. Negative for suicidal ideas.    Patient Active Problem List   Diagnosis Date Noted  . Breast mass, right 03/30/2015  . Acquired hypothyroidism 11/10/2014  . Essential (primary) hypertension 11/10/2014  . Myalgia and myositis 11/10/2014  . HLD (hyperlipidemia) 11/10/2014  . Irritable bowel syndrome with diarrhea 11/10/2014  . Asthma, mild intermittent 11/10/2014  . Extreme obesity ([place]) 11/10/2014  . Chronic peptic ulcer 11/10/2014  . Psoriasis 11/10/2014  . Obstructive apnea 11/10/2014  . Chest pain 07/19/2012  . Dermatitis, eczematoid 09/30/2011  . Allergic rhinitis 09/08/2011    Prior to Admission medications   Medication Sig Start Date End Date Taking? Authorizing Provider  albuterol (PROVENTIL HFA;VENTOLIN HFA) 108 (90 BASE) MCG/ACT inhaler Inhale 2 puffs into the lungs 4 (four) times daily as needed. 01/24/15  Yes Glean Hess, MD  aspirin 81 MG EC tablet Take 1 tablet by mouth daily.   Yes Historical Provider, MD  Cetirizine HCl 10 MG CAPS Take 1 capsule by mouth daily.   Yes Historical Provider, MD  cholestyramine light (PREVALITE) 4 g packet MIX 1 PACKET AS DIRECTED AND TAKE ONCE DAILY 06/26/15  Yes Glean Hess, MD  cyclobenzaprine (FLEXERIL) 5 MG tablet Take 3 tablets (15 mg total) by mouth 2 (two) times daily. 03/19/15  Yes Glean Hess, MD  doxazosin (CARDURA) 1 MG tablet TAKE 1 TABLET BY MOUTH ONCE DAILY 06/11/15  Yes Glean Hess, MD  doxepin (SINEQUAN) 100 MG capsule TAKE 1 CAPSULE BY MOUTH ONCE DAILY AT BEDTIME 06/11/15  Yes Glean Hess, MD  DULoxetine (CYMBALTA) 30 MG capsule Take 3 capsules (90 mg total) by mouth daily. 03/19/15  Yes Glean Hess, MD  esomeprazole (NEXIUM) 40 MG capsule Take 1 capsule by mouth daily. 10/16/14  Yes Historical Provider, MD  fluticasone (FLONASE) 50 MCG/ACT nasal spray Place 2 sprays into  the nose daily.   Yes Historical Provider, MD  levothyroxine (SYNTHROID, LEVOTHROID) 137 MCG tablet Take 1 tablet by mouth daily. 10/30/14  Yes Historical Provider, MD  lisinopril (PRINIVIL,ZESTRIL) 40 MG tablet Take 1 tablet (40 mg total) by mouth daily. 07/30/15  Yes Glean Hess, MD  loperamide (IMODIUM A-D) 2 MG tablet Take 1 tablet by mouth 3 (three) times daily as needed.   Yes Historical Provider, MD  meloxicam (MOBIC) 15 MG tablet TAKE 1 TABLET BY MOUTH ONCE DAILY 06/26/15  Yes Glean Hess, MD  montelukast (SINGULAIR) 10 MG tablet TAKE 1 TABLET BY MOUTH NIGHTLY AT BEDTIME 06/11/15  Yes Glean Hess, MD  nebivolol (BYSTOLIC) 10 MG tablet Take 1 tablet (10 mg total) by mouth daily. 07/30/15  Yes Glean Hess, MD  traMADol (ULTRAM) 50 MG tablet TAKE 1 TABLET BY MOUTH TWICE DAILY AS NEEDED FOR SEVERE PAIN 11/10/14  Yes Glean Hess, MD  Astrid Drafts Omega-3 300 MG CAPS Take 1 capsule by mouth daily. Reported on 07/31/2015    Historical Provider, MD  Multiple Vitamins-Iron (MULTIPLE VITAMIN/IRON PO) Take by mouth. Reported on 07/31/2015    Historical Provider, MD    Allergies  Allergen Reactions  . Amlodipine Swelling    Leg swelling  . Furosemide Swelling  . Lyrica [Pregabalin] Other (See Comments)    Dizziness and blurred vision  . Sulfa Antibiotics Other (See Comments)    seizure  . Hydrochlorothiazide Itching and Rash    Past Surgical History  Procedure Laterality Date  . Appendectomy    . Cholecystectomy    . Skin cancer excision      BCCA of face  . Breast biopsy Left 2007    Social History  Substance Use Topics  . Smoking status: Never Smoker   . Smokeless tobacco: None  . Alcohol Use: 1.2 oz/week    2 Standard drinks or equivalent per week    Medication list has been reviewed and updated.   Physical Exam  Constitutional: She is oriented to person, place, and time. She appears well-developed. No distress.  HENT:  Head: Normocephalic and atraumatic.    Eyes: Pupils are equal, round, and reactive to light.  Neck: Normal range of motion. Neck supple.  Cardiovascular: Normal rate, regular rhythm and normal heart sounds.   Pulmonary/Chest: Effort normal. No respiratory distress. She has wheezes. She has no rales. She exhibits no tenderness.  Musculoskeletal: Normal range of motion. She exhibits no edema or tenderness.  Neurological: She is alert and oriented to person, place, and time.  Skin: Skin is warm and dry. Lesion (scaly plaques) noted. No rash noted.  Psychiatric: She has a normal mood and affect. Her speech is normal and behavior is normal. Thought content normal.    BP 158/100 mmHg  Pulse 80  Ht 5\' 1"  (1.549 m)  Wt 237 lb 12.8 oz (107.865 kg)  BMI 44.95 kg/m2  Assessment and Plan: 1. Essential (primary) hypertension Not controlled - will increase dose of Cardura to 2 mg Continue lisinopril and bystolic - doxazosin (CARDURA) 2 MG tablet; Take 0.5 tablets (1 mg total) by mouth daily.  Dispense: 90 tablet; Refill: 1  2. Breast mass, right Due for follow up - US BREAST LTD UNI RIGHT INC AXILLA; Future - MM Digital Diagnostic Unilat R; Future  3. Asthma, mild intermittent, uncomplicated Will add Symbicort - samples given; call for Rx if helpful Consider repeat allergy testing - budesonide-formoterol (SYMBICORT) 160-4.5 MCG/ACT inhaler; Inhale 1 puff into the lungs 2 (two) times daily.  Dispense: 1 Inhaler; Refill: 0  4. Psoriasis Continue topical moisturizers  5. Myalgia and myositis Recommend trying [employer] and increasing physical activity as able  Halina Maidens, MD [facility]  07/31/2015

## 2015-08-03 ENCOUNTER — Other Ambulatory Visit: Payer: Self-pay

## 2015-08-03 DIAGNOSIS — I10 Essential (primary) hypertension: Secondary | ICD-10-CM

## 2015-08-03 MED ORDER — DOXAZOSIN MESYLATE 2 MG PO TABS: 2.0000 mg | ORAL_TABLET | Freq: Every day | ORAL | Status: DC

## 2015-08-03 NOTE — Telephone Encounter (Signed)
Patient states that Cardura should be 1 tablet (2mg  daily) not .5tablets. Could you please correct and send to pharmacy.

## 2015-08-15 ENCOUNTER — Ambulatory Visit
Admission: RE | Admit: 2015-08-15 | Discharge: 2015-08-15 | Disposition: A | Discharge status: Home / Self Care | Payer: 59 | Source: Ambulatory Visit | Attending: Internal Medicine | Admitting: Internal Medicine

## 2015-08-15 ENCOUNTER — Other Ambulatory Visit: Payer: Self-pay | Admitting: Internal Medicine

## 2015-08-15 DIAGNOSIS — R928 Other abnormal and inconclusive findings on diagnostic imaging of breast: Secondary | ICD-10-CM | POA: Diagnosis not present

## 2015-08-15 DIAGNOSIS — N6310 Unspecified lump in the right breast, unspecified quadrant: Secondary | ICD-10-CM

## 2015-08-15 DIAGNOSIS — N63 Unspecified lump in breast: Secondary | ICD-10-CM | POA: Diagnosis not present

## 2015-08-15 DIAGNOSIS — N6489 Other specified disorders of breast: Secondary | ICD-10-CM | POA: Diagnosis not present

## 2015-09-19 ENCOUNTER — Other Ambulatory Visit: Payer: Self-pay

## 2015-09-19 ENCOUNTER — Other Ambulatory Visit: Payer: Self-pay | Admitting: Internal Medicine

## 2015-09-19 MED ORDER — TRAMADOL HCL 50 MG PO TABS: 50.0000 mg | ORAL_TABLET | Freq: Two times a day (BID) | ORAL | Status: DC | PRN

## 2015-09-19 NOTE — Telephone Encounter (Signed)
Patient is requesting refill. Thanks!

## 2015-09-30 ENCOUNTER — Other Ambulatory Visit: Payer: Self-pay | Admitting: Internal Medicine

## 2015-09-30 DIAGNOSIS — N6310 Unspecified lump in the right breast, unspecified quadrant: Secondary | ICD-10-CM

## 2015-10-01 ENCOUNTER — Ambulatory Visit (INDEPENDENT_AMBULATORY_CARE_PROVIDER_SITE_OTHER): Payer: 59 | Admitting: Internal Medicine

## 2015-10-01 ENCOUNTER — Encounter: Payer: Self-pay | Admitting: Internal Medicine

## 2015-10-01 VITALS — BP 128/84 | HR 76 | Ht 61.0 in | Wt 236.0 lb

## 2015-10-01 DIAGNOSIS — N63 Unspecified lump in breast: Secondary | ICD-10-CM | POA: Diagnosis not present

## 2015-10-01 DIAGNOSIS — E039 Hypothyroidism, unspecified: Secondary | ICD-10-CM

## 2015-10-01 DIAGNOSIS — IMO0001 Reserved for inherently not codable concepts without codable children: Secondary | ICD-10-CM

## 2015-10-01 DIAGNOSIS — M609 Myositis, unspecified: Secondary | ICD-10-CM | POA: Diagnosis not present

## 2015-10-01 DIAGNOSIS — I10 Essential (primary) hypertension: Secondary | ICD-10-CM

## 2015-10-01 DIAGNOSIS — M791 Myalgia: Secondary | ICD-10-CM

## 2015-10-01 DIAGNOSIS — N6310 Unspecified lump in the right breast, unspecified quadrant: Secondary | ICD-10-CM

## 2015-10-01 DIAGNOSIS — R7309 Other abnormal glucose: Secondary | ICD-10-CM | POA: Diagnosis not present

## 2015-10-01 MED ORDER — MELOXICAM 15 MG PO TABS: 15.0000 mg | ORAL_TABLET | Freq: Every day | ORAL | Status: DC

## 2015-10-01 MED ORDER — DOXAZOSIN MESYLATE 2 MG PO TABS: 2.0000 mg | ORAL_TABLET | Freq: Every day | ORAL | Status: DC

## 2015-10-01 NOTE — Progress Notes (Signed)
Date:  10/01/2015   Name:  [name]   DOB:  07/11/1960   MRN:  [id number]   Chief Complaint: Follow-up and Hypertension Hypertension This is a chronic problem. The current episode started more than 1 year ago. The problem is uncontrolled. Associated symptoms include headaches and shortness of breath. Pertinent negatives include no chest pain or palpitations. Past treatments include central alpha agonists, ACE inhibitors and beta blockers. The current treatment provides moderate improvement. Hypertensive end-organ damage includes a thyroid problem.  Thyroid Problem Presents for follow-up visit. Symptoms include depressed mood and fatigue. Patient reports no palpitations. Past treatments include levothyroxine. The treatment provided significant relief. Risk factors: no recent labs.   Chronic fatigue - an ongoing issue despite better control of BP.  She is on thyroid meds but has not had labs in more than 18 months.  She is unable to exercise.  She does not take Vitamin D and has never had a level drawn.  Breast Mass - recent 3 month follow up showed the mass was smaller.  Recommended a bilateral Dx Mammo with [place] if needed in July.  Review of Systems  Constitutional: Positive for fatigue. Negative for chills and unexpected weight change.  Respiratory: Positive for cough and shortness of breath. Negative for wheezing.   Cardiovascular: Negative for chest pain, palpitations and leg swelling.  Gastrointestinal: Negative for abdominal pain.  Genitourinary: Positive for dyspareunia (mild bleeding after intercourse). Negative for difficulty urinating.  Musculoskeletal: Positive for arthralgias.  Neurological: Positive for headaches. Negative for dizziness.    Patient Active Problem List   Diagnosis Date Noted  . Breast mass, right 03/30/2015  . Acquired hypothyroidism 11/10/2014  . Essential (primary) hypertension 11/10/2014  . Myalgia and myositis 11/10/2014  . HLD (hyperlipidemia)  11/10/2014  . Irritable bowel syndrome with diarrhea 11/10/2014  . Asthma, mild intermittent 11/10/2014  . Extreme obesity ([place]) 11/10/2014  . Chronic peptic ulcer 11/10/2014  . Psoriasis 11/10/2014  . Obstructive apnea 11/10/2014  . Chest pain 07/19/2012  . Dermatitis, eczematoid 09/30/2011  . Allergic rhinitis 09/08/2011    Prior to Admission medications   Medication Sig Start Date End Date Taking? Authorizing Provider  albuterol (PROVENTIL HFA;VENTOLIN HFA) 108 (90 BASE) MCG/ACT inhaler Inhale 2 puffs into the lungs 4 (four) times daily as needed. 01/24/15   Glean Hess, MD  aspirin 81 MG EC tablet Take 1 tablet by mouth daily.    Historical Provider, MD  budesonide-formoterol (SYMBICORT) 160-4.5 MCG/ACT inhaler Inhale 1 puff into the lungs 2 (two) times daily. 07/31/15   Glean Hess, MD  Cetirizine HCl 10 MG CAPS Take 1 capsule by mouth daily.    Historical Provider, MD  cholestyramine light (PREVALITE) 4 g packet MIX 1 PACKET AS DIRECTED AND TAKE ONCE DAILY 06/26/15   Glean Hess, MD  cyclobenzaprine (FLEXERIL) 5 MG tablet Take 3 tablets (15 mg total) by mouth 2 (two) times daily. 03/19/15   Glean Hess, MD  doxazosin (CARDURA) 2 MG tablet Take 1 tablet (2 mg total) by mouth daily. 08/03/15   Glean Hess, MD  doxepin (SINEQUAN) 100 MG capsule TAKE 1 CAPSULE BY MOUTH ONCE DAILY AT BEDTIME 06/11/15   Glean Hess, MD  DULoxetine (CYMBALTA) 30 MG capsule Take 3 capsules (90 mg total) by mouth daily. 03/19/15   Glean Hess, MD  esomeprazole (NEXIUM) 40 MG capsule Take 1 capsule by mouth daily. 10/16/14   Historical Provider, MD  fluticasone (FLONASE) 50 MCG/ACT nasal  spray Place 2 sprays into the nose daily.    Historical Provider, MD  Astrid Drafts Omega-3 300 MG CAPS Take 1 capsule by mouth daily. Reported on 07/31/2015    Historical Provider, MD  levothyroxine (SYNTHROID, LEVOTHROID) 137 MCG tablet Take 1 tablet by mouth daily. 10/30/14   Historical Provider, MD    lisinopril (PRINIVIL,ZESTRIL) 40 MG tablet Take 1 tablet (40 mg total) by mouth daily. 07/30/15   Glean Hess, MD  loperamide (IMODIUM A-D) 2 MG tablet Take 1 tablet by mouth 3 (three) times daily as needed.    Historical Provider, MD  meloxicam (MOBIC) 15 MG tablet TAKE 1 TABLET BY MOUTH ONCE DAILY 06/26/15   Glean Hess, MD  montelukast (SINGULAIR) 10 MG tablet TAKE 1 TABLET BY MOUTH NIGHTLY AT BEDTIME 06/11/15   Glean Hess, MD  Multiple Vitamins-Iron (MULTIPLE VITAMIN/IRON PO) Take by mouth. Reported on 07/31/2015    Historical Provider, MD  nebivolol (BYSTOLIC) 10 MG tablet Take 1 tablet (10 mg total) by mouth daily. 07/30/15   Glean Hess, MD  traMADol (ULTRAM) 50 MG tablet Take 1 tablet (50 mg total) by mouth every 12 (twelve) hours as needed. 09/19/15   Glean Hess, MD    Allergies  Allergen Reactions  . Amlodipine Swelling    Leg swelling  . Furosemide Swelling  . Lyrica [Pregabalin] Other (See Comments)    Dizziness and blurred vision  . Sulfa Antibiotics Other (See Comments)    seizure  . Hydrochlorothiazide Itching and Rash    Past Surgical History  Procedure Laterality Date  . Appendectomy    . Cholecystectomy    . Skin cancer excision      BCCA of face  . Breast biopsy Left 2007    Social History  Substance Use Topics  . Smoking status: Never Smoker   . Smokeless tobacco: None  . Alcohol Use: 1.2 oz/week    2 Standard drinks or equivalent per week     Medication list has been reviewed and updated.   Physical Exam  Constitutional: She is oriented to person, place, and time. She appears well-developed and well-nourished. No distress.  HENT:  Head: Normocephalic and atraumatic.  Neck: Normal range of motion. Neck supple.  Cardiovascular: Normal rate, regular rhythm and normal heart sounds.   Pulmonary/Chest: Effort normal and breath sounds normal. No respiratory distress. She has no wheezes. She has no rales.  Musculoskeletal: She  exhibits no edema.  Lymphadenopathy:    She has no cervical adenopathy.  Neurological: She is alert and oriented to person, place, and time.  Skin: Skin is warm and dry. No rash noted.  Psychiatric: She has a normal mood and affect. Her behavior is normal. Thought content normal.    BP 128/84 mmHg  Pulse 76  Ht 5\' 1"  (1.549 m)  Wt 236 lb (107.049 kg)  BMI 44.61 kg/m2  Assessment and Plan: 1. Essential (primary) hypertension Improved control - continue current therapy - Comprehensive metabolic panel - CBC with Differential/Platelet - doxazosin (CARDURA) 2 MG tablet; Take 1 tablet (2 mg total) by mouth daily.  Dispense: 90 tablet; Refill: 3  2. Acquired hypothyroidism supplemented - TSH  3. Breast mass, right - MM Digital Diagnostic Bilat; Future - US BREAST LTD UNI LEFT INC AXILLA; Future - US BREAST LTD UNI RIGHT INC AXILLA; Future  4. Myalgia and myositis Continue medication - cymbalta, etc Begin Vitamin D 800 IU daily Check level at CP   Halina Maidens, MD [facility]  [facility]  10/01/2015

## 2015-10-02 ENCOUNTER — Other Ambulatory Visit: Payer: Self-pay | Admitting: Internal Medicine

## 2015-10-02 ENCOUNTER — Telehealth: Payer: Self-pay

## 2015-10-02 DIAGNOSIS — Z87898 Personal history of other specified conditions: Secondary | ICD-10-CM

## 2015-10-02 LAB — COMPREHENSIVE METABOLIC PANEL
ALBUMIN: 4.1 g/dL (ref 3.5–5.5)
ALK PHOS: 109 IU/L (ref 39–117)
ALT: 20 IU/L (ref 0–32)
AST: 16 IU/L (ref 0–40)
Albumin/Globulin Ratio: 1.6 (ref 1.2–2.2)
BUN / CREAT RATIO: 16 (ref 9–23)
BUN: 10 mg/dL (ref 6–24)
Bilirubin Total: 0.3 mg/dL (ref 0.0–1.2)
CHLORIDE: 98 mmol/L (ref 96–106)
CO2: 26 mmol/L (ref 18–29)
CREATININE: 0.62 mg/dL (ref 0.57–1.00)
Calcium: 9.1 mg/dL (ref 8.7–10.2)
GFR calc Af Amer: 117 mL/min/{1.73_m2} (ref >59)
GFR calc non Af Amer: 102 mL/min/{1.73_m2} (ref >59)
GLUCOSE: 117 mg/dL — AB (ref 65–99)
Globulin, Total: 2.6 g/dL (ref 1.5–4.5)
Potassium: 4.2 mmol/L (ref 3.5–5.2)
Sodium: 142 mmol/L (ref 134–144)
Total Protein: 6.7 g/dL (ref 6.0–8.5)

## 2015-10-02 LAB — CBC WITH DIFFERENTIAL/PLATELET
BASOS ABS: 0.1 10*3/uL (ref 0.0–0.2)
Basos: 1 %
EOS (ABSOLUTE): 0.4 10*3/uL (ref 0.0–0.4)
EOS: 3 %
HEMATOCRIT: 40.9 % (ref 34.0–46.6)
HEMOGLOBIN: 13.5 g/dL (ref 11.1–15.9)
IMMATURE GRANS (ABS): 0.1 10*3/uL (ref 0.0–0.1)
IMMATURE GRANULOCYTES: 1 %
LYMPHS: 33 %
Lymphocytes Absolute: 3.9 10*3/uL — ABNORMAL HIGH (ref 0.7–3.1)
MCH: 31.4 pg (ref 26.6–33.0)
MCHC: 33 g/dL (ref 31.5–35.7)
MCV: 95 fL (ref 79–97)
MONOCYTES: 5 %
Monocytes Absolute: 0.6 10*3/uL (ref 0.1–0.9)
NEUTROS PCT: 57 %
Neutrophils Absolute: 7 10*3/uL (ref 1.4–7.0)
Platelets: 374 10*3/uL (ref 150–379)
RBC: 4.3 x10E6/uL (ref 3.77–5.28)
RDW: 13.9 % (ref 12.3–15.4)
WBC: 11.9 10*3/uL — AB (ref 3.4–10.8)

## 2015-10-02 LAB — TSH: TSH: 16.41 u[IU]/mL — AB (ref 0.450–4.500)

## 2015-10-02 MED ORDER — LEVOTHYROXINE SODIUM 150 MCG PO TABS: 150.0000 ug | ORAL_TABLET | Freq: Every day | ORAL | Status: DC

## 2015-10-02 NOTE — Telephone Encounter (Signed)
Left message for patient to call back  

## 2015-10-02 NOTE — Telephone Encounter (Signed)
-----   Message from Glean Hess, MD sent at 10/02/2015  8:01 AM EDT ----- Thyroid is low - need to increase dose to 150 mcg per day (sent to pharmacy).  Will recheck in 3-4 months or at next appointment.  Blood sugar slightly elevated but not fasting. Please add A1C.  All other labs normal.

## 2015-10-03 NOTE — Telephone Encounter (Signed)
Left message for patient to call back  

## 2015-10-03 NOTE — Telephone Encounter (Signed)
Spoke with patient. Patient advised of all results and verbalized understanding. Will call back with any future questions or concerns. MAH  

## 2015-10-03 NOTE — Telephone Encounter (Signed)
-----   Message from Glean Hess, MD sent at 10/03/2015  8:28 AM EDT ----- Labs show pre-diabetes.  Limit carbs and sweets.

## 2015-10-04 LAB — HGB A1C W/O EAG: Hgb A1c MFr Bld: 6.2 % — ABNORMAL HIGH (ref 4.8–5.6)

## 2015-10-04 LAB — SPECIMEN STATUS REPORT

## 2015-10-19 ENCOUNTER — Other Ambulatory Visit: Payer: Self-pay | Admitting: Internal Medicine

## 2015-10-22 NOTE — Telephone Encounter (Signed)
Pt is scheduled for CPE on 8/10 @ 1030 am

## 2015-10-26 ENCOUNTER — Other Ambulatory Visit: Payer: Self-pay

## 2015-10-26 MED ORDER — CHOLESTYRAMINE LIGHT 4 G PO PACK: PACK | ORAL | Status: DC

## 2015-11-02 ENCOUNTER — Other Ambulatory Visit: Payer: Self-pay | Admitting: Internal Medicine

## 2015-11-02 DIAGNOSIS — I10 Essential (primary) hypertension: Secondary | ICD-10-CM

## 2015-11-13 ENCOUNTER — Ambulatory Visit: Payer: 59

## 2015-11-13 ENCOUNTER — Other Ambulatory Visit: Payer: 59

## 2015-11-16 ENCOUNTER — Other Ambulatory Visit: Payer: 59

## 2015-11-16 ENCOUNTER — Ambulatory Visit: Payer: 59

## 2015-12-06 ENCOUNTER — Other Ambulatory Visit: Payer: 59

## 2015-12-06 ENCOUNTER — Ambulatory Visit: Admission: RE | Admit: 2015-12-06 | Payer: 59 | Source: Ambulatory Visit

## 2016-01-17 ENCOUNTER — Ambulatory Visit (INDEPENDENT_AMBULATORY_CARE_PROVIDER_SITE_OTHER): Payer: 59 | Admitting: Internal Medicine

## 2016-01-17 ENCOUNTER — Encounter: Payer: Self-pay | Admitting: Internal Medicine

## 2016-01-17 ENCOUNTER — Encounter (INDEPENDENT_AMBULATORY_CARE_PROVIDER_SITE_OTHER): Payer: Self-pay

## 2016-01-17 VITALS — BP 126/84 | HR 73 | Resp 16 | Ht 61.0 in | Wt 234.0 lb

## 2016-01-17 DIAGNOSIS — I10 Essential (primary) hypertension: Secondary | ICD-10-CM | POA: Diagnosis not present

## 2016-01-17 DIAGNOSIS — N63 Unspecified lump in breast: Secondary | ICD-10-CM

## 2016-01-17 DIAGNOSIS — N6310 Unspecified lump in the right breast, unspecified quadrant: Secondary | ICD-10-CM

## 2016-01-17 DIAGNOSIS — L409 Psoriasis, unspecified: Secondary | ICD-10-CM | POA: Diagnosis not present

## 2016-01-17 DIAGNOSIS — E039 Hypothyroidism, unspecified: Secondary | ICD-10-CM

## 2016-01-17 DIAGNOSIS — E785 Hyperlipidemia, unspecified: Secondary | ICD-10-CM | POA: Diagnosis not present

## 2016-01-17 DIAGNOSIS — J4520 Mild intermittent asthma, uncomplicated: Secondary | ICD-10-CM

## 2016-01-17 DIAGNOSIS — Z0000 Encounter for general adult medical examination without abnormal findings: Secondary | ICD-10-CM

## 2016-01-17 DIAGNOSIS — Z1159 Encounter for screening for other viral diseases: Secondary | ICD-10-CM

## 2016-01-17 DIAGNOSIS — I872 Venous insufficiency (chronic) (peripheral): Secondary | ICD-10-CM | POA: Diagnosis not present

## 2016-01-17 DIAGNOSIS — Z23 Encounter for immunization: Secondary | ICD-10-CM

## 2016-01-17 LAB — POCT URINALYSIS DIPSTICK
BILIRUBIN UA: NEGATIVE
GLUCOSE UA: NEGATIVE
Ketones, UA: NEGATIVE
Leukocytes, UA: NEGATIVE
Nitrite, UA: NEGATIVE
Protein, UA: NEGATIVE
RBC UA: NEGATIVE
pH, UA: 6

## 2016-01-17 NOTE — Patient Instructions (Addendum)
Pneumococcal Conjugate Vaccine (PCV13)   1. Why get vaccinated?  Vaccination can protect both children and adults from pneumococcal disease.  Pneumococcal disease is caused by bacteria that can spread from person to person through close contact. It can cause ear infections, and it can also lead to more serious infections of the:  · Lungs (pneumonia),  · Blood (bacteremia), and  · Covering of the brain and spinal cord (meningitis).  Pneumococcal pneumonia is most common among adults. Pneumococcal meningitis can cause deafness and brain damage, and it kills about 1 child in 10 who get it.  Anyone can get pneumococcal disease, but children under 2 years of age and adults 65 years and older, people with certain medical conditions, and cigarette smokers are at the highest risk.  Before there was a vaccine, the [place] saw:  · more than 700 cases of meningitis,  · about 13,000 blood infections,  · about 5 million ear infections, and  · about 200 deaths  in children under 5 each year from pneumococcal disease. Since vaccine became available, severe pneumococcal disease in these children has fallen by 88%.  About 18,000 older adults die of pneumococcal disease each year in the [place].  Treatment of pneumococcal infections with penicillin and other drugs is not as effective as it used to be, because some strains of the disease have become resistant to these drugs. This makes prevention of the disease, through vaccination, even more important.  2. PCV13 vaccine  Pneumococcal conjugate vaccine (called PCV13) protects against 13 types of pneumococcal bacteria.  PCV13 is routinely given to children at 2, 4, 6, and 12-15 months of age. It is also recommended for children and adults 2 to 64 years of age with certain health conditions, and for all adults 65 years of age and older. Your doctor can give you details.  3. Some people should not get this vaccine  Anyone who has ever had a life-threatening allergic reaction  to a dose of this vaccine, to an earlier pneumococcal vaccine called PCV7, or to any vaccine containing diphtheria toxoid (for example, DTaP), should not get PCV13.  Anyone with a severe allergy to any component of PCV13 should not get the vaccine. Tell your doctor if the person being vaccinated has any severe allergies.  If the person scheduled for vaccination is not feeling well, your healthcare provider might decide to reschedule the shot on another day.  4. Risks of a vaccine reaction  With any medicine, including vaccines, there is a chance of reactions. These are usually mild and go away on their own, but serious reactions are also possible.  Problems reported following PCV13 varied by age and dose in the series. The most common problems reported among children were:  · About half became drowsy after the shot, had a temporary loss of appetite, or had redness or tenderness where the shot was given.  · About 1 out of 3 had swelling where the shot was given.  · About 1 out of 3 had a mild fever, and about 1 in 20 had a fever over 102.2°F.  · Up to about 8 out of 10 became fussy or irritable.  Adults have reported pain, redness, and swelling where the shot was given; also mild fever, fatigue, headache, chills, or muscle pain.  Young children who get PCV13 along with inactivated flu vaccine at the same time may be at increased risk for seizures caused by fever. Ask your doctor for more information.  Problems that   could happen after any vaccine:  · People sometimes faint after a medical procedure, including vaccination. Sitting or lying down for about 15 minutes can help prevent fainting, and injuries caused by a fall. Tell your doctor if you feel dizzy, or have vision changes or ringing in the ears.  · Some older children and adults get severe pain in the shoulder and have difficulty moving the arm where a shot was given. This happens very rarely.  · Any medication can cause a severe allergic reaction. Such  reactions from a vaccine are very rare, estimated at about 1 in a million doses, and would happen within a few minutes to a few hours after the vaccination.  As with any medicine, there is a very small chance of a vaccine causing a serious injury or death.  The safety of vaccines is always being monitored. For more information, visit: www.cdc.gov/vaccinesafety/  5. What if there is a serious reaction?  What should I look for?  · Look for anything that concerns you, such as signs of a severe allergic reaction, very high fever, or unusual behavior.  Signs of a severe allergic reaction can include hives, swelling of the face and throat, difficulty breathing, a fast heartbeat, dizziness, and weakness-usually within a few minutes to a few hours after the vaccination.  What should I do?  · If you think it is a severe allergic reaction or other emergency that can't wait, call 9-1-1 or get the person to the nearest hospital. Otherwise, call your doctor.  Reactions should be reported to the Vaccine Adverse Event Reporting System (VAERS). Your doctor should file this report, or you can do it yourself through the VAERS web site at www.vaers.hhs.gov, or by calling 1-800-822-7967.  VAERS does not give medical advice.  6. The National Vaccine Injury Compensation Program  The National Vaccine Injury Compensation Program (VICP) is a federal program that was created to compensate people who may have been injured by certain vaccines.  Persons who believe they may have been injured by a vaccine can learn about the program and about filing a claim by calling 1-800-338-2382 or visiting the VICP website at www.hrsa.gov/vaccinecompensation. There is a time limit to file a claim for compensation.  7. How can I learn more?  · Ask your healthcare provider. He or she can give you the vaccine package insert or suggest other sources of information.  · Call your local or state health department.  · Contact the Centers for Disease Control and  Prevention (CDC):    Call 1-800-232-4636 (1-800-CDC-INFO) or    Visit CDC's website at www.cdc.gov/vaccines  Vaccine Information Statement  PCV13 Vaccine (04/13/2014)     This information is not intended to replace advice given to you by your health care provider. Make sure you discuss any questions you have with your health care provider.     Document Released: 03/23/2006 Document Revised: 06/16/2014 Document Reviewed: 04/20/2014  Elsevier Interactive Patient Education ©2016 [employer]

## 2016-01-17 NOTE — Progress Notes (Signed)
Date:  01/17/2016   Name:  [name]   DOB:  14-May-1961   MRN:  [id number]   Chief Complaint: Annual Exam [name] is a 55 y.o. female who presents today for her Complete Annual Exam. She feels fairly well. She reports exercising none. She reports she is sleeping poorly. She is due for a repeat Dx Mammogram that she had to cancel - she will call today to reschedule. Pap is due next year.  Hypertension  Pertinent negatives include no chest pain, headaches, palpitations or shortness of breath. Hypertensive end-organ damage includes a thyroid problem.  Asthma  She complains of chest tightness and cough. There is no shortness of breath or wheezing. This is a recurrent problem. The current episode started more than 1 year ago. The problem occurs intermittently. Associated symptoms include myalgias. Pertinent negatives include no chest pain, fever, headaches or trouble swallowing. Her past medical history is significant for asthma.  Hyperlipidemia  This is a chronic problem. The current episode started more than 1 year ago. The problem is controlled. Associated symptoms include myalgias. Pertinent negatives include no chest pain or shortness of breath. Current antihyperlipidemic treatment includes statins.  Thyroid Problem  Presents for follow-up visit. Symptoms include fatigue. Patient reports no anxiety, constipation, diarrhea, palpitations or tremors. The symptoms have been improving (better since dose increase but feels she may need more). Her past medical history is significant for hyperlipidemia.     Review of Systems  Constitutional: Positive for fatigue. Negative for chills and fever.  HENT: Negative for congestion, hearing loss, tinnitus, trouble swallowing and voice change.   Eyes: Negative for visual disturbance.  Respiratory: Positive for cough. Negative for chest tightness, shortness of breath and wheezing.   Cardiovascular: Positive for leg swelling. Negative for  chest pain and palpitations.  Gastrointestinal: Negative for abdominal pain, constipation, diarrhea and vomiting.  Endocrine: Negative for polydipsia and polyuria.  Genitourinary: Negative for dysuria, frequency, genital sores, vaginal bleeding and vaginal discharge.  Musculoskeletal: Positive for gait problem (frequent falls) and myalgias. Negative for arthralgias and joint swelling.  Skin: Negative for color change and rash.  Neurological: Negative for dizziness, tremors, light-headedness and headaches.  Hematological: Negative for adenopathy. Does not bruise/bleed easily.  Psychiatric/Behavioral: Negative for dysphoric mood and sleep disturbance. The patient is not nervous/anxious.     Patient Active Problem List   Diagnosis Date Noted  . Breast mass, right 03/30/2015  . Acquired hypothyroidism 11/10/2014  . Essential (primary) hypertension 11/10/2014  . Myalgia and myositis 11/10/2014  . HLD (hyperlipidemia) 11/10/2014  . Irritable bowel syndrome with diarrhea 11/10/2014  . Asthma, mild intermittent 11/10/2014  . Extreme obesity ([place]) 11/10/2014  . Chronic peptic ulcer 11/10/2014  . Psoriasis 11/10/2014  . Obstructive apnea 11/10/2014  . Chest pain 07/19/2012  . Dermatitis, eczematoid 09/30/2011  . Allergic rhinitis 09/08/2011    Prior to Admission medications   Medication Sig Start Date End Date Taking? Authorizing Provider  albuterol (PROVENTIL HFA;VENTOLIN HFA) 108 (90 BASE) MCG/ACT inhaler Inhale 2 puffs into the lungs 4 (four) times daily as needed. 01/24/15  Yes Glean Hess, MD  aspirin 81 MG EC tablet Take 1 tablet by mouth daily.   Yes Historical Provider, MD  budesonide-formoterol (SYMBICORT) 160-4.5 MCG/ACT inhaler Inhale 1 puff into the lungs 2 (two) times daily. 07/31/15  Yes Glean Hess, MD  Cetirizine HCl 10 MG CAPS Take 1 capsule by mouth daily.   Yes Historical Provider, MD  cholestyramine light (PREVALITE) 4  g packet MIX 1 PACKET AS DIRECTED AND TAKE  ONCE DAILY 10/26/15  Yes Glean Hess, MD  cyclobenzaprine (FLEXERIL) 5 MG tablet Take 3 tablets (15 mg total) by mouth 2 (two) times daily. 03/19/15  Yes Glean Hess, MD  doxazosin (CARDURA) 2 MG tablet Take 1 tablet (2 mg total) by mouth daily. 10/01/15  Yes Glean Hess, MD  doxepin (SINEQUAN) 100 MG capsule TAKE 1 CAPSULE BY MOUTH ONCE DAILY AT BEDTIME 06/11/15  Yes Glean Hess, MD  DULoxetine (CYMBALTA) 30 MG capsule Take 3 capsules (90 mg total) by mouth daily. 03/19/15  Yes Glean Hess, MD  esomeprazole (NEXIUM) 40 MG capsule TAKE ONE CAPSULE BY MOUTH AT BEDTIME 10/21/15  Yes Glean Hess, MD  fluticasone [facility]) 50 MCG/ACT nasal spray Place 2 sprays into the nose daily.   Yes Historical Provider, MD  levothyroxine (SYNTHROID, LEVOTHROID) 150 MCG tablet Take 1 tablet (150 mcg total) by mouth daily. 10/02/15  Yes Glean Hess, MD  lisinopril (PRINIVIL,ZESTRIL) 40 MG tablet Take 1 tablet (40 mg total) by mouth daily. 07/30/15  Yes Glean Hess, MD  loperamide (IMODIUM A-D) 2 MG tablet Take 1 tablet by mouth 3 (three) times daily as needed.   Yes Historical Provider, MD  meloxicam (MOBIC) 15 MG tablet Take 1 tablet (15 mg total) by mouth daily. 10/01/15  Yes Glean Hess, MD  montelukast (SINGULAIR) 10 MG tablet TAKE 1 TABLET BY MOUTH NIGHTLY AT BEDTIME 06/11/15  Yes Glean Hess, MD  Multiple Vitamins-Iron (MULTIPLE VITAMIN/IRON PO) Take by mouth. Reported on 07/31/2015   Yes Historical Provider, MD  nebivolol (BYSTOLIC) 10 MG tablet Take 1 tablet (10 mg total) by mouth daily. 07/30/15  Yes Glean Hess, MD  traMADol (ULTRAM) 50 MG tablet Take 1 tablet (50 mg total) by mouth every 12 (twelve) hours as needed. 09/19/15  Yes Glean Hess, MD    Allergies  Allergen Reactions  . Amlodipine Swelling    Leg swelling  . Furosemide Swelling  . Lyrica [Pregabalin] Other (See Comments)    Dizziness and blurred vision  . Sulfa Antibiotics Other (See Comments)     seizure  . Hydrochlorothiazide Itching and Rash    Past Surgical History:  Procedure Laterality Date  . APPENDECTOMY    . BREAST BIOPSY Left 2007  . CHOLECYSTECTOMY    . SKIN CANCER EXCISION     BCCA of face    Social History  Substance Use Topics  . Smoking status: Never Smoker  . Smokeless tobacco: Never Used  . Alcohol use 1.2 oz/week    2 Standard drinks or equivalent per week     Medication list has been reviewed and updated.   Physical Exam  Constitutional: She is oriented to person, place, and time. She appears well-developed and well-nourished. No distress.  HENT:  Head: Normocephalic and atraumatic.  Right Ear: Tympanic membrane normal.  Left Ear: Tympanic membrane normal.  Nose: Right sinus exhibits no maxillary sinus tenderness. Left sinus exhibits no maxillary sinus tenderness.  Mouth/Throat: Uvula is midline and oropharynx is clear and moist.  Psoriatic changes both ear canals  Eyes: Conjunctivae and EOM are normal. Right eye exhibits no discharge. Left eye exhibits no discharge. No scleral icterus.  Neck: Normal range of motion. Carotid bruit is not present. No erythema present. No thyromegaly present.  Cardiovascular: Normal rate, regular rhythm, normal heart sounds, intact distal pulses and normal pulses.   Pulmonary/Chest: Effort normal. No respiratory distress.  She has no wheezes. Right breast exhibits no mass, no nipple discharge, no skin change and no tenderness. Left breast exhibits no mass, no nipple discharge, no skin change and no tenderness.  Abdominal: Soft. Bowel sounds are normal. There is no hepatosplenomegaly. There is no tenderness. There is no CVA tenderness.  Musculoskeletal: She exhibits edema. She exhibits no tenderness.  Lymphadenopathy:    She has no cervical adenopathy.    She has no axillary adenopathy.  Neurological: She is alert and oriented to person, place, and time. She has normal strength and normal reflexes. No cranial nerve  deficit or sensory deficit.  Skin: Skin is warm, dry and intact. Rash (psoriatic rash in ears; diffuse papular rash both anterior shins) noted.  Psychiatric: She has a normal mood and affect. Her speech is normal and behavior is normal. Thought content normal.  Nursing note and vitals reviewed.   BP 126/84 (BP Location: Right Arm, Patient Position: Sitting, Cuff Size: Large)   Pulse 73   Resp 16   Ht 5\' 1"  (1.549 m)   Wt 234 lb (106.1 kg)   SpO2 96%   BMI 44.21 kg/m   Assessment and Plan: 1. Annual physical exam Continue to work on diet - Hemoglobin A1c  2. Essential (primary) hypertension Improved control - Comprehensive metabolic panel - POCT urinalysis dipstick - CBC with Differential/Platelet  3. Acquired hypothyroidism Adjust dose if needed - TSH  4. HLD (hyperlipidemia) Will advise on medicaton - Lipid panel  5. Need for hepatitis C screening test - Hepatitis C antibody  6. Breast mass, right Mammogram will be scheduled by patient  7. Need for pneumococcal vaccination - Pneumococcal conjugate vaccine 13-valent IM  8. Psoriasis Continue topical agents Use cortisone on shins - follow up with Derm if needed  9. Asthma, mild intermittent, uncomplicated Stable; continue daily medication   Halina Maidens, MD [facility]  01/17/2016

## 2016-01-18 ENCOUNTER — Other Ambulatory Visit: Payer: Self-pay | Admitting: Internal Medicine

## 2016-01-18 LAB — CBC WITH DIFFERENTIAL/PLATELET
BASOS ABS: 0.1 10*3/uL (ref 0.0–0.2)
Basos: 1 %
EOS (ABSOLUTE): 0.4 10*3/uL (ref 0.0–0.4)
Eos: 4 %
Hematocrit: 40.6 % (ref 34.0–46.6)
Hemoglobin: 13.1 g/dL (ref 11.1–15.9)
Immature Grans (Abs): 0.1 10*3/uL (ref 0.0–0.1)
Immature Granulocytes: 1 %
LYMPHS ABS: 3.4 10*3/uL — AB (ref 0.7–3.1)
Lymphs: 33 %
MCH: 31.4 pg (ref 26.6–33.0)
MCHC: 32.3 g/dL (ref 31.5–35.7)
MCV: 97 fL (ref 79–97)
MONOS ABS: 0.4 10*3/uL (ref 0.1–0.9)
Monocytes: 3 %
Neutrophils Absolute: 6.2 10*3/uL (ref 1.4–7.0)
Neutrophils: 58 %
Platelets: 359 10*3/uL (ref 150–379)
RBC: 4.17 x10E6/uL (ref 3.77–5.28)
RDW: 14.1 % (ref 12.3–15.4)
WBC: 10.5 10*3/uL (ref 3.4–10.8)

## 2016-01-18 LAB — LIPID PANEL
CHOL/HDL RATIO: 3.9 ratio (ref 0.0–4.4)
Cholesterol, Total: 191 mg/dL (ref 100–199)
HDL: 49 mg/dL (ref >39)
LDL Calculated: 99 mg/dL (ref 0–99)
Triglycerides: 217 mg/dL — ABNORMAL HIGH (ref 0–149)
VLDL Cholesterol Cal: 43 mg/dL — ABNORMAL HIGH (ref 5–40)

## 2016-01-18 LAB — COMPREHENSIVE METABOLIC PANEL
A/G RATIO: 1.8 (ref 1.2–2.2)
ALT: 17 IU/L (ref 0–32)
AST: 16 IU/L (ref 0–40)
Albumin: 4.3 g/dL (ref 3.5–5.5)
Alkaline Phosphatase: 108 IU/L (ref 39–117)
BILIRUBIN TOTAL: 0.4 mg/dL (ref 0.0–1.2)
BUN / CREAT RATIO: 17 (ref 9–23)
BUN: 10 mg/dL (ref 6–24)
CHLORIDE: 99 mmol/L (ref 96–106)
CO2: 23 mmol/L (ref 18–29)
Calcium: 8.9 mg/dL (ref 8.7–10.2)
Creatinine, Ser: 0.58 mg/dL (ref 0.57–1.00)
GFR calc non Af Amer: 103 mL/min/{1.73_m2} (ref >59)
GFR, EST AFRICAN AMERICAN: 119 mL/min/{1.73_m2} (ref >59)
Globulin, Total: 2.4 g/dL (ref 1.5–4.5)
Glucose: 84 mg/dL (ref 65–99)
POTASSIUM: 4.7 mmol/L (ref 3.5–5.2)
SODIUM: 144 mmol/L (ref 134–144)
TOTAL PROTEIN: 6.7 g/dL (ref 6.0–8.5)

## 2016-01-18 LAB — HEMOGLOBIN A1C
ESTIMATED AVERAGE GLUCOSE: 128 mg/dL
HEMOGLOBIN A1C: 6.1 % — AB (ref 4.8–5.6)

## 2016-01-18 LAB — HEPATITIS C ANTIBODY: Hep C Virus Ab: <0.1 s/co ratio (ref 0.0–0.9)

## 2016-01-18 LAB — TSH: TSH: 6.33 u[IU]/mL — AB (ref 0.450–4.500)

## 2016-01-18 MED ORDER — LEVOTHYROXINE SODIUM 175 MCG PO TABS: 175.0000 ug | ORAL_TABLET | Freq: Every day | ORAL | 1 refills | Status: DC

## 2016-01-23 ENCOUNTER — Other Ambulatory Visit: Payer: Self-pay | Admitting: Internal Medicine

## 2016-02-18 DIAGNOSIS — D2272 Melanocytic nevi of left lower limb, including hip: Secondary | ICD-10-CM | POA: Diagnosis not present

## 2016-02-18 DIAGNOSIS — L400 Psoriasis vulgaris: Secondary | ICD-10-CM | POA: Diagnosis not present

## 2016-02-18 DIAGNOSIS — L821 Other seborrheic keratosis: Secondary | ICD-10-CM | POA: Diagnosis not present

## 2016-04-02 ENCOUNTER — Other Ambulatory Visit: Payer: Self-pay | Admitting: Internal Medicine

## 2016-04-22 ENCOUNTER — Other Ambulatory Visit: Payer: Self-pay | Admitting: Internal Medicine

## 2016-05-21 ENCOUNTER — Other Ambulatory Visit: Payer: Self-pay | Admitting: Internal Medicine

## 2016-05-27 NOTE — Telephone Encounter (Signed)
pts coming in on thursday

## 2016-05-29 ENCOUNTER — Ambulatory Visit (INDEPENDENT_AMBULATORY_CARE_PROVIDER_SITE_OTHER): Payer: 59 | Admitting: Internal Medicine

## 2016-05-29 ENCOUNTER — Encounter: Payer: Self-pay | Admitting: Internal Medicine

## 2016-05-29 VITALS — BP 128/84 | HR 86 | Temp 97.6°F | Ht 61.0 in | Wt 231.0 lb

## 2016-05-29 DIAGNOSIS — I10 Essential (primary) hypertension: Secondary | ICD-10-CM

## 2016-05-29 DIAGNOSIS — K580 Irritable bowel syndrome with diarrhea: Secondary | ICD-10-CM | POA: Diagnosis not present

## 2016-05-29 DIAGNOSIS — E039 Hypothyroidism, unspecified: Secondary | ICD-10-CM

## 2016-05-29 DIAGNOSIS — M1810 Unilateral primary osteoarthritis of first carpometacarpal joint, unspecified hand: Secondary | ICD-10-CM | POA: Insufficient documentation

## 2016-05-29 DIAGNOSIS — J4520 Mild intermittent asthma, uncomplicated: Secondary | ICD-10-CM

## 2016-05-29 MED ORDER — BUDESONIDE-FORMOTEROL FUMARATE 160-4.5 MCG/ACT IN AERO: 1.0000 | INHALATION_SPRAY | Freq: Two times a day (BID) | RESPIRATORY_TRACT | 3 refills | Status: DC

## 2016-05-29 MED ORDER — CHOLESTYRAMINE LIGHT 4 G PO PACK: PACK | ORAL | 3 refills | Status: DC

## 2016-05-29 NOTE — Progress Notes (Addendum)
Date:  05/29/2016   Name:  [name]   DOB:  11-22-1960   MRN:  [id number]   Chief Complaint: Hypothyroidism and Hypertension Hypertension  This is a chronic problem. The current episode started more than 1 year ago. The problem is unchanged. The problem is controlled. Associated symptoms include shortness of breath (stable asthma sx). Pertinent negatives include no chest pain or palpitations. Hypertensive end-organ damage includes a thyroid problem.  Thyroid Problem  Presents for follow-up visit. Symptoms include diarrhea (controlled fairly well with Questran) and fatigue. Patient reports no palpitations. The symptoms have been improving.  Insomnia  Primary symptoms: difficulty falling asleep, frequent awakening.  The current episode started more than one month. The problem occurs nightly. The symptoms are aggravated by pain. Past treatments include medication.  Thumb OA - she is having more pain, weakness and decreased movement in both thumbs.  Worse on the right.  Taking mobic daily but not sure if it has quit working.  She already has Tramadol for more severe pain.   Review of Systems  Constitutional: Positive for fatigue. Negative for chills.  Respiratory: Positive for shortness of breath (stable asthma sx) and wheezing. Negative for chest tightness.   Cardiovascular: Negative for chest pain and palpitations.  Gastrointestinal: Positive for diarrhea (controlled fairly well with Questran). Negative for abdominal pain.  Musculoskeletal: Positive for arthralgias and myalgias.  Psychiatric/Behavioral: The patient has insomnia.     Patient Active Problem List   Diagnosis Date Noted  . Venous insufficiency of both lower extremities 01/17/2016  . Breast mass, right 03/30/2015  . Acquired hypothyroidism 11/10/2014  . Essential (primary) hypertension 11/10/2014  . Myalgia and myositis 11/10/2014  . HLD (hyperlipidemia) 11/10/2014  . Irritable bowel syndrome with diarrhea  11/10/2014  . Asthma, mild intermittent 11/10/2014  . Extreme obesity ([place]) 11/10/2014  . Chronic peptic ulcer 11/10/2014  . Psoriasis 11/10/2014  . Obstructive apnea 11/10/2014  . Chest pain 07/19/2012  . Dermatitis, eczematoid 09/30/2011  . Allergic rhinitis 09/08/2011    Prior to Admission medications   Medication Sig Start Date End Date Taking? Authorizing Provider  albuterol (PROVENTIL HFA;VENTOLIN HFA) 108 (90 BASE) MCG/ACT inhaler Inhale 2 puffs into the lungs 4 (four) times daily as needed. 01/24/15  Yes Glean Hess, MD  aspirin 81 MG EC tablet Take 1 tablet by mouth daily.   Yes Historical Provider, MD  budesonide-formoterol (SYMBICORT) 160-4.5 MCG/ACT inhaler Inhale 1 puff into the lungs 2 (two) times daily. 07/31/15  Yes Glean Hess, MD  BYSTOLIC 10 MG tablet TAKE 1 TABLET BY MOUTH DAILY. 01/23/16  Yes Glean Hess, MD  Cetirizine HCl 10 MG CAPS Take 1 capsule by mouth daily.   Yes Historical Provider, MD  cholestyramine light (PREVALITE) 4 g packet MIX 1 PACKET AS DIRECTED AND TAKE ONCE DAILY 10/26/15  Yes Glean Hess, MD  cromolyn (NASALCROM) 5.2 MG/ACT nasal spray Place 1 spray into both nostrils 4 (four) times daily.   Yes Historical Provider, MD  cyclobenzaprine (FLEXERIL) 5 MG tablet TAKE 3 TABLETS BY MOUTH TWO TIMES DAILY 04/02/16  Yes Glean Hess, MD  doxazosin (CARDURA) 2 MG tablet Take 1 tablet (2 mg total) by mouth daily. 10/01/15  Yes Glean Hess, MD  doxepin (SINEQUAN) 100 MG capsule TAKE 1 CAPSULE BY MOUTH ONCE DAILY AT BEDTIME 06/11/15  Yes Glean Hess, MD  DULoxetine (CYMBALTA) 30 MG capsule TAKE 3 CAPSULES BY MOUTH DAILY 04/02/16  Yes Glean Hess, MD  esomeprazole (NEXIUM) 40 MG capsule TAKE ONE CAPSULE BY MOUTH AT BEDTIME 04/22/16  Yes Glean Hess, MD  fluticasone [facility]) 50 MCG/ACT nasal spray Place 2 sprays into the nose daily.   Yes Historical Provider, MD  levothyroxine (SYNTHROID, LEVOTHROID) 175 MCG tablet Take 1  tablet (175 mcg total) by mouth daily. 01/18/16  Yes Glean Hess, MD  lisinopril (PRINIVIL,ZESTRIL) 40 MG tablet TAKE 1 TABLET BY MOUTH DAILY. 01/23/16  Yes Glean Hess, MD  loperamide (IMODIUM A-D) 2 MG tablet Take 1 tablet by mouth 3 (three) times daily as needed.   Yes Historical Provider, MD  meloxicam (MOBIC) 15 MG tablet Take 1 tablet (15 mg total) by mouth daily. 10/01/15  Yes Glean Hess, MD  montelukast (SINGULAIR) 10 MG tablet TAKE 1 TABLET BY MOUTH NIGHTLY AT BEDTIME 06/11/15  Yes Glean Hess, MD  traMADol (ULTRAM) 50 MG tablet TAKE ONE TABLET BY MOUTH EVERY 12 HOURS AS NEEDED 05/21/16  Yes Glean Hess, MD  Multiple Vitamins-Iron (MULTIPLE VITAMIN/IRON PO) Take by mouth. Reported on 07/31/2015    Historical Provider, MD    Allergies  Allergen Reactions  . Amlodipine Swelling    Leg swelling  . Furosemide Swelling  . Lyrica [Pregabalin] Other (See Comments)    Dizziness and blurred vision  . Sulfa Antibiotics Other (See Comments)    seizure  . Hydrochlorothiazide Itching and Rash    Past Surgical History:  Procedure Laterality Date  . APPENDECTOMY    . BREAST BIOPSY Left 2007  . CHOLECYSTECTOMY    . SKIN CANCER EXCISION     BCCA of face    Social History  Substance Use Topics  . Smoking status: Never Smoker  . Smokeless tobacco: Never Used  . Alcohol use 1.2 oz/week    2 Standard drinks or equivalent per week     Medication list has been reviewed and updated.   Physical Exam  Constitutional: She is oriented to person, place, and time. She appears well-developed. No distress.  HENT:  Head: Normocephalic and atraumatic.  Neck: Normal range of motion. Neck supple.  Cardiovascular: Normal rate, regular rhythm and normal heart sounds.   Pulmonary/Chest: Effort normal and breath sounds normal. No respiratory distress. She has no wheezes.  Musculoskeletal:  Atrophy of both thenar muscles with mild weakness and tenderness.  Neurological: She is  alert and oriented to person, place, and time.  Skin: Skin is warm and dry. No rash noted.  Psychiatric: She has a normal mood and affect. Her behavior is normal. Thought content normal.  Nursing note and vitals reviewed.   BP 128/84   Pulse 86   Temp 97.6 F (36.4 C)   Ht 5\' 1"  (1.549 m)   Wt 231 lb (104.8 kg)   SpO2 97%   BMI 43.65 kg/m   Assessment and Plan: 1. Essential (primary) hypertension controlled  2. Mild intermittent asthma without complication Stable - continue symbicort - budesonide-formoterol (SYMBICORT) 160-4.5 MCG/ACT inhaler; Inhale 1 puff into the lungs 2 (two) times daily.  Dispense: 3 Inhaler; Refill: 3  3. Irritable bowel syndrome with diarrhea controlled - cholestyramine light (PREVALITE) 4 g packet; MIX 1 PACKET AS DIRECTED AND TAKE ONCE DAILY  Dispense: 90 packet; Refill: 3  4. Acquired hypothyroidism Supplemented - adjust dose as needed - TSH  5. Arthritis of carpometacarpal (CMC) joint of thumb Continue Mobic Consider Orthopedic consult  6. Insomnia  Will give free trial of Belsomra  Halina Maidens, MD [facility]  05/29/2016

## 2016-06-05 DIAGNOSIS — E039 Hypothyroidism, unspecified: Secondary | ICD-10-CM | POA: Diagnosis not present

## 2016-06-06 ENCOUNTER — Other Ambulatory Visit: Payer: Self-pay | Admitting: Internal Medicine

## 2016-06-06 LAB — TSH: TSH: 7.68 u[IU]/mL — ABNORMAL HIGH (ref 0.450–4.500)

## 2016-06-06 MED ORDER — LEVOTHYROXINE SODIUM 200 MCG PO TABS: 200.0000 ug | ORAL_TABLET | Freq: Every day | ORAL | 1 refills | Status: DC

## 2016-06-10 ENCOUNTER — Other Ambulatory Visit: Payer: Self-pay | Admitting: Internal Medicine

## 2016-06-10 ENCOUNTER — Telehealth: Payer: Self-pay

## 2016-06-10 NOTE — Telephone Encounter (Signed)
Mailed coupons and RX for [employer]

## 2016-07-23 ENCOUNTER — Other Ambulatory Visit: Payer: Self-pay | Admitting: Internal Medicine

## 2016-08-11 ENCOUNTER — Other Ambulatory Visit: Payer: Self-pay | Admitting: *Deleted

## 2016-08-11 NOTE — Patient Outreach (Signed)
[place] [facility]) Care Management  08/11/2016  BRANDEE HOPEWELL 1960/10/25 [id number]  RN attempted outreach call to pt today however only able to leave a HIPAA approved voice message requesting a call back. Will further address possible needs at that time. Will attempt another call back on tomorrow.   [name], RN Care Management Coordinator [place] Office [phone]

## 2016-08-12 ENCOUNTER — Other Ambulatory Visit: Payer: Self-pay | Admitting: *Deleted

## 2016-08-12 NOTE — Patient Outreach (Signed)
[place] [facility]) Care Management  08/12/2016  [name] June 18, 1960 [id number]   Second outreach attempt to reach this pt however unsuccessful. RN will continue with another outreach call to this pt this week. Will inquire further with any needs or possible referrals.  [name], RN Care Management Coordinator [place] Office [phone]

## 2016-08-18 ENCOUNTER — Encounter: Payer: Self-pay | Admitting: *Deleted

## 2016-08-18 ENCOUNTER — Other Ambulatory Visit: Payer: Self-pay | Admitting: *Deleted

## 2016-08-18 NOTE — Patient Outreach (Signed)
[place] [facility]) Care Management  08/18/2016  [name] 03-15-1961 [id number]   Third outreach attempt unsuccessful. RN able to leave a HIPAA approved voice message requesting a call back. Will inquire further on possible needs at that time. Will send outreach letter and allow pt time to respond prior to case closure.  [name], RN Care Management Coordinator [place] Office [phone]

## 2016-09-10 ENCOUNTER — Other Ambulatory Visit: Payer: Self-pay | Admitting: Internal Medicine

## 2016-09-16 ENCOUNTER — Ambulatory Visit (INDEPENDENT_AMBULATORY_CARE_PROVIDER_SITE_OTHER): Payer: 59 | Admitting: Internal Medicine

## 2016-09-16 ENCOUNTER — Other Ambulatory Visit: Payer: Self-pay | Admitting: *Deleted

## 2016-09-16 ENCOUNTER — Encounter: Payer: Self-pay | Admitting: Internal Medicine

## 2016-09-16 ENCOUNTER — Other Ambulatory Visit: Payer: Self-pay | Admitting: Internal Medicine

## 2016-09-16 VITALS — BP 142/80 | HR 76 | Ht 61.5 in | Wt 239.6 lb

## 2016-09-16 DIAGNOSIS — R7303 Prediabetes: Secondary | ICD-10-CM | POA: Insufficient documentation

## 2016-09-16 DIAGNOSIS — E039 Hypothyroidism, unspecified: Secondary | ICD-10-CM | POA: Diagnosis not present

## 2016-09-16 DIAGNOSIS — G4700 Insomnia, unspecified: Secondary | ICD-10-CM | POA: Insufficient documentation

## 2016-09-16 DIAGNOSIS — G8929 Other chronic pain: Secondary | ICD-10-CM

## 2016-09-16 DIAGNOSIS — E118 Type 2 diabetes mellitus with unspecified complications: Secondary | ICD-10-CM | POA: Insufficient documentation

## 2016-09-16 DIAGNOSIS — I10 Essential (primary) hypertension: Secondary | ICD-10-CM

## 2016-09-16 DIAGNOSIS — M1810 Unilateral primary osteoarthritis of first carpometacarpal joint, unspecified hand: Secondary | ICD-10-CM | POA: Diagnosis not present

## 2016-09-16 DIAGNOSIS — M25511 Pain in right shoulder: Secondary | ICD-10-CM | POA: Diagnosis not present

## 2016-09-16 MED ORDER — SUVOREXANT 15 MG PO TABS: 15.0000 mg | ORAL_TABLET | Freq: Every day | ORAL | 0 refills | Status: DC

## 2016-09-16 MED ORDER — TRAMADOL HCL 50 MG PO TABS: 50.0000 mg | ORAL_TABLET | Freq: Two times a day (BID) | ORAL | 5 refills | Status: DC | PRN

## 2016-09-16 MED ORDER — BELSOMRA 10 MG PO TABS: 1.0000 | ORAL_TABLET | Freq: Every day | ORAL | 0 refills | Status: DC

## 2016-09-16 NOTE — Patient Outreach (Signed)
[place] [facility]) Care Management  09/16/2016  [name] Jul 24, 1960 [id number]   Case will be closed with no response from outreach letter. CMA and provider will be notified.  [name], RN Care Management Coordinator [place] Office [phone]

## 2016-09-16 NOTE — Progress Notes (Signed)
Date:  09/16/2016   Name:  [name]   DOB:  1961-04-08   MRN:  [id number]   Chief Complaint: Hypertension and Hypothyroidism Hypertension  This is a chronic problem. The problem is unchanged. The problem is controlled. Pertinent negatives include no chest pain, palpitations or shortness of breath. Identifiable causes of hypertension include a thyroid problem.  Thyroid Problem  Presents for follow-up visit. Patient reports no diaphoresis, leg swelling, palpitations, tremors or weight gain. (Dose changed last visit) The symptoms have been improving.  Insomnia  Primary symptoms: sleep disturbance, difficulty falling asleep, premature morning awakening.  The symptoms are aggravated by pain. Treatments tried: belsomra helped her sleep longer once she was able to go to sleep - would like to try higher dose.  Hand Pain   There was no injury mechanism. The pain is present in the right hand and right fingers. The quality of the pain is described as aching and cramping. The pain does not radiate. Pertinent negatives include no chest pain. She has tried NSAIDs for the symptoms. The treatment provided no relief.  Also having right shoulder pain - worse with reaching backwards.  No injury.  Can not sleep on that side.  Taking mobic and tramadol without benefit.  Lab Results  Component Value Date   TSH 7.680 (H) 06/05/2016   Lab Results  Component Value Date   HGBA1C 6.1 (H) 01/17/2016     Review of Systems  Constitutional: Negative for diaphoresis and weight gain.  Eyes: Negative for visual disturbance.  Respiratory: Negative for chest tightness, shortness of breath and wheezing.   Cardiovascular: Negative for chest pain, palpitations and leg swelling.  Musculoskeletal: Positive for arthralgias, joint swelling and myalgias.  Neurological: Positive for weakness (in right hand due to pain). Negative for tremors and syncope.  Hematological: Negative for adenopathy.    Psychiatric/Behavioral: Positive for sleep disturbance. The patient has insomnia.     Patient Active Problem List   Diagnosis Date Noted  . Pre-diabetes 09/16/2016  . Arthritis of carpometacarpal (CMC) joint of thumb 05/29/2016  . Venous insufficiency of both lower extremities 01/17/2016  . Breast mass, right 03/30/2015  . Acquired hypothyroidism 11/10/2014  . Essential (primary) hypertension 11/10/2014  . Myalgia and myositis 11/10/2014  . HLD (hyperlipidemia) 11/10/2014  . Irritable bowel syndrome with diarrhea 11/10/2014  . Asthma, mild intermittent 11/10/2014  . Extreme obesity ([place]) 11/10/2014  . Chronic peptic ulcer 11/10/2014  . Psoriasis 11/10/2014  . Obstructive apnea 11/10/2014  . Chest pain 07/19/2012  . Dermatitis, eczematoid 09/30/2011  . Allergic rhinitis 09/08/2011    Prior to Admission medications   Medication Sig Start Date End Date Taking? Authorizing Provider  albuterol (PROVENTIL HFA;VENTOLIN HFA) 108 (90 BASE) MCG/ACT inhaler Inhale 2 puffs into the lungs 4 (four) times daily as needed. 01/24/15   Glean Hess, MD  aspirin 81 MG EC tablet Take 1 tablet by mouth daily.    Historical Provider, MD  BELSOMRA 10 MG TABS Take 1 tablet by mouth at bedtime. 06/16/16   Historical Provider, MD  budesonide-formoterol (SYMBICORT) 160-4.5 MCG/ACT inhaler Inhale 1 puff into the lungs 2 (two) times daily. 05/29/16   Glean Hess, MD  BYSTOLIC 10 MG tablet TAKE 1 TABLET BY MOUTH DAILY 07/23/16   Glean Hess, MD  Cetirizine HCl 10 MG CAPS Take 1 capsule by mouth daily.    Historical Provider, MD  cholestyramine light (PREVALITE) 4 g packet MIX 1 PACKET AS DIRECTED AND TAKE ONCE DAILY  05/29/16   Glean Hess, MD  cromolyn (NASALCROM) 5.2 MG/ACT nasal spray Place 1 spray into both nostrils 4 (four) times daily.    Historical Provider, MD  cyclobenzaprine (FLEXERIL) 5 MG tablet TAKE 3 TABLETS BY MOUTH TWO TIMES DAILY 04/02/16   Glean Hess, MD  doxazosin  (CARDURA) 2 MG tablet Take 1 tablet (2 mg total) by mouth daily. 10/01/15   Glean Hess, MD  doxepin (SINEQUAN) 100 MG capsule TAKE 1 CAPSULE BY MOUTH ONCE DAILY AT BEDTIME 06/10/16   Glean Hess, MD  DULoxetine (CYMBALTA) 30 MG capsule TAKE 3 CAPSULES BY MOUTH DAILY 04/02/16   Glean Hess, MD  esomeprazole (NEXIUM) 40 MG capsule TAKE ONE CAPSULE BY MOUTH AT BEDTIME 09/10/16   Glean Hess, MD  fluticasone [facility]) 50 MCG/ACT nasal spray Place 2 sprays into the nose daily.    Historical Provider, MD  levothyroxine (SYNTHROID, LEVOTHROID) 200 MCG tablet Take 1 tablet (200 mcg total) by mouth daily. 06/06/16   Glean Hess, MD  lisinopril (PRINIVIL,ZESTRIL) 40 MG tablet TAKE 1 TABLET BY MOUTH DAILY 07/23/16   Glean Hess, MD  loperamide (IMODIUM A-D) 2 MG tablet Take 1 tablet by mouth 3 (three) times daily as needed.    Historical Provider, MD  meloxicam (MOBIC) 15 MG tablet Take 1 tablet (15 mg total) by mouth daily. 10/01/15   Glean Hess, MD  montelukast (SINGULAIR) 10 MG tablet TAKE 1 TABLET BY MOUTH NIGHTLY AT BEDTIME 06/10/16   Glean Hess, MD  Multiple Vitamins-Iron (MULTIPLE VITAMIN/IRON PO) Take by mouth. Reported on 07/31/2015    Historical Provider, MD  traMADol (ULTRAM) 50 MG tablet TAKE ONE TABLET BY MOUTH EVERY 12 HOURS AS NEEDED 05/21/16   Glean Hess, MD    Allergies  Allergen Reactions  . Amlodipine Swelling    Leg swelling  . Furosemide Swelling  . Lyrica [Pregabalin] Other (See Comments)    Dizziness and blurred vision  . Sulfa Antibiotics Other (See Comments)    seizure  . Hydrochlorothiazide Itching and Rash    Past Surgical History:  Procedure Laterality Date  . APPENDECTOMY    . BREAST BIOPSY Left 2007  . CHOLECYSTECTOMY    . SKIN CANCER EXCISION     BCCA of face    Social History  Substance Use Topics  . Smoking status: Never Smoker  . Smokeless tobacco: Never Used  . Alcohol use 1.2 oz/week    2 Standard drinks or  equivalent per week     Medication list has been reviewed and updated.   Physical Exam  Constitutional: She is oriented to person, place, and time. She appears well-developed. No distress.  HENT:  Head: Normocephalic and atraumatic.  Neck: Normal range of motion.  Cardiovascular: Normal rate, regular rhythm and normal heart sounds.   Pulmonary/Chest: Effort normal and breath sounds normal. No respiratory distress. She has no wheezes.  Musculoskeletal:       Right shoulder: She exhibits decreased range of motion and tenderness. She exhibits no spasm and normal strength.  Thenar atrophy; OA changed of thumb; decreased grip Small ganglion on ventral wrist  Neurological: She is alert and oriented to person, place, and time.  Skin: Skin is warm and dry. No rash noted.  Psychiatric: She has a normal mood and affect. Her behavior is normal. Thought content normal.  Nursing note and vitals reviewed.   BP (!) 142/80 (BP Location: Right Arm, Patient Position: Sitting, Cuff Size: Large)  Pulse 76   Ht 5' 1.5" (1.562 m)   Wt 239 lb 9.6 oz (108.7 kg)   SpO2 96%   BMI 44.54 kg/m   Assessment and Plan: 1. Essential (primary) hypertension Fair control - difficult due to chronic pain - Comprehensive metabolic panel  2. Acquired hypothyroidism On higher dose - adjust if needed - TSH  3. Arthritis of carpometacarpal (CMC) joint of thumb - Ambulatory referral to Orthopedic Surgery  4. Pre-diabetes Continue low carb diet - Hemoglobin A1c  5. Insomnia, unspecified type Try higher dose Belsomra  6. Chronic right shoulder pain Continue tramadol and mobic for now Consider Ortho consult   Meds ordered this encounter  Medications  . DISCONTD: BELSOMRA 10 MG TABS    Sig: Take 1 tablet by mouth at bedtime.    Dispense:  10 tablet    Refill:  0  . DISCONTD: traMADol (ULTRAM) 50 MG tablet    Sig: Take 1 tablet (50 mg total) by mouth every 12 (twelve) hours as needed.    Dispense:   60 tablet    Refill:  5  . Suvorexant (BELSOMRA) 15 MG TABS    Sig: Take 15 mg by mouth at bedtime.    Dispense:  10 tablet    Refill:  0  . traMADol (ULTRAM) 50 MG tablet    Sig: Take 1 tablet (50 mg total) by mouth every 12 (twelve) hours as needed.    Dispense:  60 tablet    Refill:  [place], MD [facility]  09/16/2016

## 2016-09-17 LAB — COMPREHENSIVE METABOLIC PANEL
A/G RATIO: 1.5 (ref 1.2–2.2)
ALBUMIN: 4.1 g/dL (ref 3.5–5.5)
ALT: 18 IU/L (ref 0–32)
AST: 20 IU/L (ref 0–40)
Alkaline Phosphatase: 105 IU/L (ref 39–117)
BILIRUBIN TOTAL: 0.3 mg/dL (ref 0.0–1.2)
BUN/Creatinine Ratio: 19 (ref 9–23)
BUN: 12 mg/dL (ref 6–24)
CO2: 27 mmol/L (ref 18–29)
Calcium: 9.4 mg/dL (ref 8.7–10.2)
Chloride: 98 mmol/L (ref 96–106)
Creatinine, Ser: 0.64 mg/dL (ref 0.57–1.00)
GFR calc non Af Amer: 100 mL/min/{1.73_m2} (ref >59)
GFR, EST AFRICAN AMERICAN: 115 mL/min/{1.73_m2} (ref >59)
GLOBULIN, TOTAL: 2.7 g/dL (ref 1.5–4.5)
Glucose: 127 mg/dL — ABNORMAL HIGH (ref 65–99)
Potassium: 4.2 mmol/L (ref 3.5–5.2)
SODIUM: 142 mmol/L (ref 134–144)
TOTAL PROTEIN: 6.8 g/dL (ref 6.0–8.5)

## 2016-09-17 LAB — TSH: TSH: 1.92 u[IU]/mL (ref 0.450–4.500)

## 2016-09-17 LAB — HEMOGLOBIN A1C
Est. average glucose Bld gHb Est-mCnc: 134 mg/dL
Hgb A1c MFr Bld: 6.3 % — ABNORMAL HIGH (ref 4.8–5.6)

## 2016-09-29 ENCOUNTER — Ambulatory Visit (INDEPENDENT_AMBULATORY_CARE_PROVIDER_SITE_OTHER): Payer: Self-pay | Admitting: Orthopaedic Surgery

## 2016-10-06 ENCOUNTER — Other Ambulatory Visit: Payer: Self-pay | Admitting: Internal Medicine

## 2016-10-06 ENCOUNTER — Ambulatory Visit (INDEPENDENT_AMBULATORY_CARE_PROVIDER_SITE_OTHER): Payer: Self-pay | Admitting: Orthopaedic Surgery

## 2016-10-10 ENCOUNTER — Other Ambulatory Visit: Payer: Self-pay | Admitting: Internal Medicine

## 2016-10-10 DIAGNOSIS — I10 Essential (primary) hypertension: Secondary | ICD-10-CM

## 2016-10-29 DIAGNOSIS — H5203 Hypermetropia, bilateral: Secondary | ICD-10-CM | POA: Diagnosis not present

## 2016-12-03 ENCOUNTER — Other Ambulatory Visit: Payer: Self-pay | Admitting: Internal Medicine

## 2017-01-07 ENCOUNTER — Other Ambulatory Visit: Payer: Self-pay | Admitting: Internal Medicine

## 2017-01-27 ENCOUNTER — Other Ambulatory Visit: Payer: Self-pay | Admitting: Internal Medicine

## 2017-02-25 ENCOUNTER — Encounter: Payer: 59 | Admitting: Internal Medicine

## 2017-03-02 ENCOUNTER — Encounter: Payer: Self-pay | Admitting: Internal Medicine

## 2017-03-02 ENCOUNTER — Ambulatory Visit (INDEPENDENT_AMBULATORY_CARE_PROVIDER_SITE_OTHER): Payer: 59 | Admitting: Internal Medicine

## 2017-03-02 VITALS — BP 128/84 | HR 73 | Ht 61.0 in | Wt 238.0 lb

## 2017-03-02 DIAGNOSIS — E039 Hypothyroidism, unspecified: Secondary | ICD-10-CM | POA: Diagnosis not present

## 2017-03-02 DIAGNOSIS — G4700 Insomnia, unspecified: Secondary | ICD-10-CM

## 2017-03-02 DIAGNOSIS — M797 Fibromyalgia: Secondary | ICD-10-CM | POA: Diagnosis not present

## 2017-03-02 DIAGNOSIS — L409 Psoriasis, unspecified: Secondary | ICD-10-CM

## 2017-03-02 DIAGNOSIS — Z23 Encounter for immunization: Secondary | ICD-10-CM

## 2017-03-02 DIAGNOSIS — Z0000 Encounter for general adult medical examination without abnormal findings: Secondary | ICD-10-CM

## 2017-03-02 DIAGNOSIS — R7303 Prediabetes: Secondary | ICD-10-CM

## 2017-03-02 DIAGNOSIS — I10 Essential (primary) hypertension: Secondary | ICD-10-CM | POA: Diagnosis not present

## 2017-03-02 DIAGNOSIS — Z1239 Encounter for other screening for malignant neoplasm of breast: Secondary | ICD-10-CM

## 2017-03-02 LAB — POCT URINALYSIS DIPSTICK
BILIRUBIN UA: NEGATIVE
Glucose, UA: NEGATIVE
Ketones, UA: NEGATIVE
LEUKOCYTES UA: NEGATIVE
NITRITE UA: NEGATIVE
PH UA: 6 (ref 5.0–8.0)
PROTEIN UA: NEGATIVE
Spec Grav, UA: 1.01 (ref 1.010–1.025)
Urobilinogen, UA: 0.2 E.U./dL

## 2017-03-02 NOTE — Progress Notes (Signed)
Date:  03/02/2017   Name:  [name]   DOB:  1960-07-16   MRN:  [id number]   Chief Complaint: Annual Exam (FLU SHOT. Pap and Breast Exam. ) and Depression (PHQ9- 10) [name] is a 56 y.o. female who presents today for her Complete Annual Exam. She feels poorly. She reports exercising very little due to fatigue and pain. She reports she is sleeping poorly. Belsomra was only slightly helpful. She is due for Pap smear and she is also due for screening mammogram.  Depression         The onset quality is undetermined (mood disorder more related to fatigue, fibromayalgia, pain and poor sleep).   Associated symptoms include fatigue, insomnia and myalgias.  Associated symptoms include no headaches.  Past medical history includes thyroid problem.   Hypertension  This is a chronic problem. The problem is controlled. Associated symptoms include shortness of breath. Pertinent negatives include no chest pain, headaches or palpitations. Past treatments include ACE inhibitors. There is no history of kidney disease, CAD/MI or CVA. Identifiable causes of hypertension include a thyroid problem.  Thyroid Problem  Presents for follow-up visit. Symptoms include fatigue. Patient reports no anxiety, constipation, diarrhea or palpitations. The symptoms have been stable.  Insomnia  Primary symptoms: sleep disturbance.  The problem occurs nightly. The problem has been waxing and waning since onset. Past treatments include medication. The treatment provided mild relief. PMH includes: depression.  fibromyalgia - has chronic fatigue pain and poor sleep. Currently on Cymbalta 90 mg, meloxicam, tramadol, and doxepin at night. She is unable to exercise due to extreme fatigue. Weight remains stable but with very high BMI. She has intermittent scant vaginal bleeding after intercourse and sometimes spontaneously. This is never enough to use more than a single pin Her. She denies pain discharge odor. This has been  going on for a number of years and has been evaluated by GYN-described as a friable cervix.  Review of Systems  Constitutional: Positive for fatigue. Negative for chills, fever and unexpected weight change.  Eyes: Negative for visual disturbance.  Respiratory: Positive for shortness of breath. Negative for chest tightness and wheezing.   Cardiovascular: Negative for chest pain, palpitations and leg swelling.  Gastrointestinal: Negative for abdominal pain, constipation and diarrhea.       Issues with bowel control - improved with cholestyramine  Genitourinary: Positive for vaginal bleeding (scant and intermittent, usually after intercourse). Negative for difficulty urinating.       Stress incontinence   Musculoskeletal: Positive for arthralgias, back pain and myalgias. Negative for joint swelling.  Skin: Negative for rash.  Allergic/Immunologic: Negative for environmental allergies.  Neurological: Negative for dizziness and headaches.  Hematological: Negative for adenopathy. Does not bruise/bleed easily.  Psychiatric/Behavioral: Positive for depression and sleep disturbance. Negative for dysphoric mood. The patient has insomnia. The patient is not nervous/anxious.     Patient Active Problem List   Diagnosis Date Noted  . Pre-diabetes 09/16/2016  . Insomnia 09/16/2016  . Arthritis of carpometacarpal (CMC) joint of thumb 05/29/2016  . Venous insufficiency of both lower extremities 01/17/2016  . Breast mass, right 03/30/2015  . Acquired hypothyroidism 11/10/2014  . Essential (primary) hypertension 11/10/2014  . Myalgia and myositis 11/10/2014  . HLD (hyperlipidemia) 11/10/2014  . Irritable bowel syndrome with diarrhea 11/10/2014  . Asthma, mild intermittent 11/10/2014  . Extreme obesity 11/10/2014  . Chronic peptic ulcer 11/10/2014  . Psoriasis 11/10/2014  . Obstructive apnea 11/10/2014  . Chest pain 07/19/2012  .  Dermatitis, eczematoid 09/30/2011  . Allergic rhinitis 09/08/2011     Prior to Admission medications   Medication Sig Start Date End Date Taking? Authorizing Provider  albuterol (PROVENTIL HFA;VENTOLIN HFA) 108 (90 BASE) MCG/ACT inhaler Inhale 2 puffs into the lungs 4 (four) times daily as needed. 01/24/15  Yes Glean Hess, MD  aspirin 81 MG EC tablet Take 1 tablet by mouth daily.   Yes [provider]  budesonide-formoterol (SYMBICORT) 160-4.5 MCG/ACT inhaler Inhale 1 puff into the lungs 2 (two) times daily. 05/29/16  Yes Glean Hess, MD  BYSTOLIC 10 MG tablet TAKE 1 TABLET BY MOUTH DAILY 01/27/17  Yes Glean Hess, MD  Cetirizine HCl 10 MG CAPS Take 1 capsule by mouth daily.   Yes [provider]  cholestyramine light (PREVALITE) 4 g packet MIX 1 PACKET AS DIRECTED AND TAKE ONCE DAILY 05/29/16  Yes Glean Hess, MD  cromolyn (NASALCROM) 5.2 MG/ACT nasal spray Place 1 spray into both nostrils 4 (four) times daily.   Yes [provider]  cyclobenzaprine (FLEXERIL) 5 MG tablet TAKE 3 TABLETS BY MOUTH TWO TIMES DAILY 04/02/16  Yes Glean Hess, MD  doxazosin (CARDURA) 2 MG tablet TAKE 1 TABLET BY MOUTH DAILY 10/10/16  Yes Glean Hess, MD  doxepin (SINEQUAN) 100 MG capsule TAKE 1 CAPSULE BY MOUTH ONCE DAILY AT BEDTIME 06/10/16  Yes Glean Hess, MD  DULoxetine (CYMBALTA) 30 MG capsule TAKE 3 CAPSULES BY MOUTH DAILY 04/02/16  Yes Glean Hess, MD  esomeprazole (NEXIUM) 40 MG capsule TAKE ONE CAPSULE BY MOUTH AT BEDTIME 01/07/17  Yes Glean Hess, MD  fluticasone [facility]) 50 MCG/ACT nasal spray Place 2 sprays into the nose daily.   Yes [provider]  levothyroxine (SYNTHROID, LEVOTHROID) 200 MCG tablet TAKE 1 TABLET (200 MCG TOTAL) BY MOUTH DAILY. 12/03/16  Yes Glean Hess, MD  lisinopril (PRINIVIL,ZESTRIL) 40 MG tablet TAKE 1 TABLET BY MOUTH DAILY 01/27/17  Yes Glean Hess, MD  loperamide (IMODIUM A-D) 2 MG tablet Take 1 tablet by mouth 3 (three) times daily as needed.   Yes  [provider]  meloxicam (MOBIC) 15 MG tablet TAKE 1 TABLET BY MOUTH DAILY 10/06/16  Yes Glean Hess, MD  montelukast (SINGULAIR) 10 MG tablet TAKE 1 TABLET BY MOUTH NIGHTLY AT BEDTIME 06/10/16  Yes Glean Hess, MD  Multiple Vitamins-Iron (MULTIPLE VITAMIN/IRON PO) Take by mouth. Reported on 07/31/2015   Yes [provider]  Suvorexant (BELSOMRA) 15 MG TABS Take 15 mg by mouth at bedtime. 09/16/16  Yes Glean Hess, MD  traMADol (ULTRAM) 50 MG tablet Take 1 tablet (50 mg total) by mouth every 12 (twelve) hours as needed. 09/16/16  Yes Glean Hess, MD    Allergies  Allergen Reactions  . Amlodipine Swelling    Leg swelling  . Furosemide Swelling  . Lyrica [Pregabalin] Other (See Comments)    Dizziness and blurred vision  . Sulfa Antibiotics Other (See Comments)    seizure  . Hydrochlorothiazide Itching and Rash    Past Surgical History:  Procedure Laterality Date  . APPENDECTOMY    . BREAST BIOPSY Left 2007  . CHOLECYSTECTOMY    . SKIN CANCER EXCISION     BCCA of face    Social History  Substance Use Topics  . Smoking status: Never Smoker  . Smokeless tobacco: Never Used  . Alcohol use 1.2 oz/week    2 Standard drinks or equivalent per week  Medication list has been reviewed and updated.  PHQ 2/9 Scores 03/02/2017  PHQ - 2 Score 4  PHQ- 9 Score 10    Physical Exam  Constitutional: She is oriented to person, place, and time. She appears well-developed. No distress.  HENT:  Head: Normocephalic and atraumatic.  Eyes: Pupils are equal, round, and reactive to light.  Neck: Normal range of motion. Neck supple. No thyromegaly present.  Cardiovascular: Normal rate, regular rhythm and normal heart sounds.   Pulmonary/Chest: Effort normal and breath sounds normal. No respiratory distress. She has no wheezes.  Abdominal: Soft. Bowel sounds are normal. She exhibits no mass. There is no tenderness. There is no guarding.  Genitourinary:  Vagina normal and uterus normal. There is no rash, tenderness, lesion or injury on the right labia. There is rash on the left labia. There is no tenderness, lesion or injury on the left labia. Cervix exhibits friability. Cervix exhibits no motion tenderness and no discharge. Right adnexum displays no mass, no tenderness and no fullness. Left adnexum displays no mass, no tenderness and no fullness.  Musculoskeletal: She exhibits no edema.  Neurological: She is alert and oriented to person, place, and time. She has normal reflexes. No sensory deficit.  Skin: Skin is warm and dry. No rash noted.  Scattered scaly pink - red plaques on chin, hands, ears, lower abdomen and left labia.  Psychiatric: She has a normal mood and affect. Her behavior is normal. Thought content normal.  Nursing note and vitals reviewed.   BP 128/84 (BP Location: Right Arm, Patient Position: Sitting, Cuff Size: Large)   Pulse 73   Ht 5\' 1"  (1.549 m)   Wt 238 lb (108 kg)   SpO2 96%   BMI 44.97 kg/m   Assessment and Plan: 1. Annual physical exam Pap done today Mood disorder related to physical debility and limitations and already on medications - POCT urinalysis dipstick - Pap IG and HPV (high risk) DNA detection  2. Breast cancer screening Schedule at [facility]; Future  3. Essential (primary) hypertension controlled - CBC with Differential/Platelet - Comprehensive metabolic panel  4. Acquired hypothyroidism supplemented - TSH  5. Fibromyalgia syndrome The source of much of her issues Samples of Horizant 300 mg to take in the evening Continue all other medications  6. Pre-diabetes - Hemoglobin A1c - Lipid panel  7. Insomnia, unspecified type Using Belsomra intermittently  8. Need for immunization against influenza - Flu Vaccine QUAD 36+ mos IM  9. Psoriasis Continue topicals Follow up with dermatology as needed   No orders of the defined types were placed in this  encounter.   Partially dictated using Editor, commissioning. Any errors are unintentional.  Halina Maidens, MD [facility]  03/02/2017

## 2017-03-02 NOTE — Patient Instructions (Signed)

## 2017-03-03 LAB — COMPREHENSIVE METABOLIC PANEL
A/G RATIO: 1.7 (ref 1.2–2.2)
ALBUMIN: 4.3 g/dL (ref 3.5–5.5)
ALT: 15 IU/L (ref 0–32)
AST: 22 IU/L (ref 0–40)
Alkaline Phosphatase: 108 IU/L (ref 39–117)
BILIRUBIN TOTAL: 0.4 mg/dL (ref 0.0–1.2)
BUN / CREAT RATIO: 19 (ref 9–23)
BUN: 10 mg/dL (ref 6–24)
CALCIUM: 9.2 mg/dL (ref 8.7–10.2)
CHLORIDE: 98 mmol/L (ref 96–106)
CO2: 27 mmol/L (ref 20–29)
Creatinine, Ser: 0.52 mg/dL — ABNORMAL LOW (ref 0.57–1.00)
GFR, EST AFRICAN AMERICAN: 123 mL/min/{1.73_m2} (ref >59)
GFR, EST NON AFRICAN AMERICAN: 106 mL/min/{1.73_m2} (ref >59)
GLOBULIN, TOTAL: 2.6 g/dL (ref 1.5–4.5)
Glucose: 86 mg/dL (ref 65–99)
POTASSIUM: 4.7 mmol/L (ref 3.5–5.2)
Sodium: 144 mmol/L (ref 134–144)
TOTAL PROTEIN: 6.9 g/dL (ref 6.0–8.5)

## 2017-03-03 LAB — CBC WITH DIFFERENTIAL/PLATELET
Basophils Absolute: 0.1 10*3/uL (ref 0.0–0.2)
Basos: 1 %
EOS (ABSOLUTE): 0.3 10*3/uL (ref 0.0–0.4)
Eos: 3 %
HEMATOCRIT: 41.6 % (ref 34.0–46.6)
HEMOGLOBIN: 13.8 g/dL (ref 11.1–15.9)
IMMATURE GRANS (ABS): 0.1 10*3/uL (ref 0.0–0.1)
Immature Granulocytes: 1 %
LYMPHS: 30 %
Lymphocytes Absolute: 3.5 10*3/uL — ABNORMAL HIGH (ref 0.7–3.1)
MCH: 31.5 pg (ref 26.6–33.0)
MCHC: 33.2 g/dL (ref 31.5–35.7)
MCV: 95 fL (ref 79–97)
Monocytes Absolute: 0.4 10*3/uL (ref 0.1–0.9)
Monocytes: 4 %
NEUTROS ABS: 7.2 10*3/uL — AB (ref 1.4–7.0)
Neutrophils: 61 %
Platelets: 396 10*3/uL — ABNORMAL HIGH (ref 150–379)
RBC: 4.38 x10E6/uL (ref 3.77–5.28)
RDW: 14 % (ref 12.3–15.4)
WBC: 11.6 10*3/uL — ABNORMAL HIGH (ref 3.4–10.8)

## 2017-03-03 LAB — LIPID PANEL
CHOL/HDL RATIO: 4 ratio (ref 0.0–4.4)
Cholesterol, Total: 197 mg/dL (ref 100–199)
HDL: 49 mg/dL (ref >39)
LDL CALC: 108 mg/dL — AB (ref 0–99)
Triglycerides: 202 mg/dL — ABNORMAL HIGH (ref 0–149)
VLDL CHOLESTEROL CAL: 40 mg/dL (ref 5–40)

## 2017-03-03 LAB — HEMOGLOBIN A1C
Est. average glucose Bld gHb Est-mCnc: 137 mg/dL
Hgb A1c MFr Bld: 6.4 % — ABNORMAL HIGH (ref 4.8–5.6)

## 2017-03-03 LAB — TSH: TSH: 2.11 u[IU]/mL (ref 0.450–4.500)

## 2017-03-04 LAB — PAP IG AND HPV HIGH-RISK
HPV, HIGH-RISK: NEGATIVE
PAP SMEAR COMMENT: 0

## 2017-04-17 ENCOUNTER — Other Ambulatory Visit: Payer: Self-pay | Admitting: Internal Medicine

## 2017-04-17 NOTE — Telephone Encounter (Signed)
Called patient about Rx she will be call back to schedule appt for February.

## 2017-05-11 ENCOUNTER — Other Ambulatory Visit: Payer: Self-pay | Admitting: Internal Medicine

## 2017-05-11 NOTE — Telephone Encounter (Signed)
lvm to set up appointment.

## 2017-05-19 ENCOUNTER — Other Ambulatory Visit: Payer: Self-pay | Admitting: Internal Medicine

## 2017-06-08 ENCOUNTER — Other Ambulatory Visit: Payer: Self-pay | Admitting: Internal Medicine

## 2017-06-08 NOTE — Telephone Encounter (Signed)
Called pt and left a message.

## 2017-06-19 ENCOUNTER — Other Ambulatory Visit: Payer: Self-pay | Admitting: Internal Medicine

## 2017-06-19 NOTE — Telephone Encounter (Signed)
lvm to set up appt

## 2017-07-07 NOTE — Telephone Encounter (Signed)
Still can not reach patient on phone.

## 2017-08-18 ENCOUNTER — Other Ambulatory Visit: Payer: Self-pay | Admitting: Internal Medicine

## 2017-08-21 ENCOUNTER — Other Ambulatory Visit: Payer: Self-pay | Admitting: Internal Medicine

## 2017-08-21 DIAGNOSIS — K580 Irritable bowel syndrome with diarrhea: Secondary | ICD-10-CM

## 2017-10-16 ENCOUNTER — Other Ambulatory Visit: Payer: Self-pay | Admitting: Internal Medicine

## 2017-11-11 ENCOUNTER — Encounter: Payer: Self-pay | Admitting: Internal Medicine

## 2017-11-12 ENCOUNTER — Encounter: Payer: Self-pay | Admitting: Internal Medicine

## 2017-11-12 ENCOUNTER — Ambulatory Visit (INDEPENDENT_AMBULATORY_CARE_PROVIDER_SITE_OTHER): Payer: No Typology Code available for payment source | Admitting: Internal Medicine

## 2017-11-12 VITALS — BP 160/104 | HR 94 | Temp 98.1°F | Resp 16 | Ht 61.5 in | Wt 232.0 lb

## 2017-11-12 DIAGNOSIS — K277 Chronic peptic ulcer, site unspecified, without hemorrhage or perforation: Secondary | ICD-10-CM

## 2017-11-12 DIAGNOSIS — E782 Mixed hyperlipidemia: Secondary | ICD-10-CM

## 2017-11-12 DIAGNOSIS — M1810 Unilateral primary osteoarthritis of first carpometacarpal joint, unspecified hand: Secondary | ICD-10-CM

## 2017-11-12 DIAGNOSIS — R7303 Prediabetes: Secondary | ICD-10-CM

## 2017-11-12 DIAGNOSIS — I10 Essential (primary) hypertension: Secondary | ICD-10-CM

## 2017-11-12 DIAGNOSIS — M797 Fibromyalgia: Secondary | ICD-10-CM | POA: Diagnosis not present

## 2017-11-12 DIAGNOSIS — G4700 Insomnia, unspecified: Secondary | ICD-10-CM | POA: Diagnosis not present

## 2017-11-12 DIAGNOSIS — Z1231 Encounter for screening mammogram for malignant neoplasm of breast: Secondary | ICD-10-CM | POA: Diagnosis not present

## 2017-11-12 DIAGNOSIS — E039 Hypothyroidism, unspecified: Secondary | ICD-10-CM

## 2017-11-12 DIAGNOSIS — Z0000 Encounter for general adult medical examination without abnormal findings: Secondary | ICD-10-CM

## 2017-11-12 DIAGNOSIS — G4733 Obstructive sleep apnea (adult) (pediatric): Secondary | ICD-10-CM | POA: Diagnosis not present

## 2017-11-12 DIAGNOSIS — Z1239 Encounter for other screening for malignant neoplasm of breast: Secondary | ICD-10-CM

## 2017-11-12 LAB — POCT URINALYSIS DIPSTICK
Bilirubin, UA: NEGATIVE
Blood, UA: NEGATIVE
GLUCOSE UA: NEGATIVE
KETONES UA: 1.015
LEUKOCYTES UA: NEGATIVE
NITRITE UA: NEGATIVE
PROTEIN UA: NEGATIVE
SPEC GRAV UA: 1.015 (ref 1.010–1.025)
Urobilinogen, UA: 0.2 E.U./dL
pH, UA: 6.5 (ref 5.0–8.0)

## 2017-11-12 MED ORDER — MELOXICAM 15 MG PO TABS: 15.0000 mg | ORAL_TABLET | Freq: Every day | ORAL | 3 refills | Status: DC

## 2017-11-12 MED ORDER — DOXAZOSIN MESYLATE 2 MG PO TABS: 2.0000 mg | ORAL_TABLET | Freq: Every day | ORAL | 3 refills | Status: DC

## 2017-11-12 MED ORDER — SUVOREXANT 15 MG PO TABS: 15.0000 mg | ORAL_TABLET | Freq: Every day | ORAL | 0 refills | Status: DC

## 2017-11-12 MED ORDER — LEVOTHYROXINE SODIUM 200 MCG PO TABS: 200.0000 ug | ORAL_TABLET | Freq: Every day | ORAL | 3 refills | Status: DC

## 2017-11-12 MED ORDER — TRAMADOL HCL 50 MG PO TABS: 50.0000 mg | ORAL_TABLET | Freq: Two times a day (BID) | ORAL | 5 refills | Status: DC | PRN

## 2017-11-12 MED ORDER — ESOMEPRAZOLE MAGNESIUM 40 MG PO CPDR: 40.0000 mg | DELAYED_RELEASE_CAPSULE | Freq: Every day | ORAL | 3 refills | Status: DC

## 2017-11-12 NOTE — Progress Notes (Signed)
Date:  11/12/2017   Name:  [name]   DOB:  1960-07-19   MRN:  [id number]   Chief Complaint: Annual Exam [name] is a 57 y.o. female who presents today for her Complete Annual Exam. She feels poorly. She reports exercising none. She reports she is sleeping poorly due to pain and lack of CPAP use. Pap is up to date.  She has a mammogram scheduled.  Hypertension  This is a chronic problem. The problem is uncontrolled (since running out of cardura). Pertinent negatives include no chest pain, headaches, palpitations or shortness of breath. Past treatments include beta blockers and ACE inhibitors. The current treatment provides significant improvement. Identifiable causes of hypertension include a thyroid problem.  Gastroesophageal Reflux  She complains of heartburn and water brash. She reports no abdominal pain, no chest pain, no coughing or no wheezing. This is a recurrent problem. The problem occurs occasionally. Associated symptoms include fatigue. Risk factors include lack of exercise and obesity. She has tried a PPI for the symptoms. The treatment provided significant relief. Past procedures include an EGD. she thinks she had one in the past few years.  Thyroid Problem  Presents for follow-up visit. Symptoms include fatigue and weight gain. Patient reports no anxiety, constipation, diarrhea, heat intolerance, leg swelling, palpitations or tremors. The symptoms have been stable.  Insomnia  Primary symptoms: sleep disturbance.  The problem occurs nightly. The problem is unchanged. Treatments tried: tried belsomra - can remember if it helped.    OSA - not using CPAP due to deteriorated mask.  Would need to get a sleep study to update equipment.  I recommended finding a mask on the internet first.  Dysphagia - trouble swallowing all of her pills at once.  Also sometimes with meat.  Never had to regurgitate.  Usually can wait a minute or two then swallow.  It is not consistent -  several times a week.    Fibromyalgia - stopped Cymbalta when she ran out and over the past few weeks has not noticed that her sx are worse.  She is still taking tramadol at times.  Would like to try Horizant.   Review of Systems  Constitutional: Positive for fatigue and weight gain. Negative for chills and fever.  HENT: Positive for trouble swallowing (intermittent upper throat dysphagia). Negative for congestion, hearing loss, tinnitus and voice change.   Eyes: Negative for visual disturbance.  Respiratory: Negative for cough, chest tightness, shortness of breath and wheezing.   Cardiovascular: Negative for chest pain, palpitations and leg swelling.  Gastrointestinal: Positive for heartburn. Negative for abdominal pain, constipation, diarrhea and vomiting.  Endocrine: Negative for heat intolerance, polydipsia and polyuria.  Genitourinary: Negative for dysuria, frequency, genital sores, vaginal bleeding and vaginal discharge.  Musculoskeletal: Positive for arthralgias and myalgias. Negative for gait problem and joint swelling.  Skin: Negative for color change and rash.  Allergic/Immunologic: Negative for environmental allergies.  Neurological: Positive for numbness (painful electric shocks in feet esp at night/evening). Negative for dizziness, tremors, light-headedness and headaches.  Hematological: Negative for adenopathy. Does not bruise/bleed easily.  Psychiatric/Behavioral: Positive for sleep disturbance. Negative for dysphoric mood. The patient has insomnia. The patient is not nervous/anxious.     Patient Active Problem List   Diagnosis Date Noted  . Pre-diabetes 09/16/2016  . Insomnia 09/16/2016  . Arthritis of carpometacarpal (CMC) joint of thumb 05/29/2016  . Venous insufficiency of both lower extremities 01/17/2016  . Acquired hypothyroidism 11/10/2014  . Essential (primary) hypertension  11/10/2014  . Fibromyalgia syndrome 11/10/2014  . HLD (hyperlipidemia) 11/10/2014  .  Irritable bowel syndrome with diarrhea 11/10/2014  . Asthma, mild intermittent 11/10/2014  . BMI 40.0-44.9, adult ([place]) 11/10/2014  . Chronic peptic ulcer 11/10/2014  . Psoriasis 11/10/2014  . Obstructive apnea 11/10/2014  . Chest pain 07/19/2012  . Dermatitis, eczematoid 09/30/2011  . Allergic rhinitis 09/08/2011    Prior to Admission medications   Medication Sig Start Date End Date Taking? Authorizing Provider  albuterol (PROVENTIL HFA;VENTOLIN HFA) 108 (90 BASE) MCG/ACT inhaler Inhale 2 puffs into the lungs 4 (four) times daily as needed. 01/24/15  Yes Glean Hess, MD  aspirin 81 MG EC tablet Take 1 tablet by mouth daily.   Yes [provider]  budesonide-formoterol (SYMBICORT) 160-4.5 MCG/ACT inhaler Inhale 1 puff into the lungs 2 (two) times daily. 05/29/16  Yes Glean Hess, MD  BYSTOLIC 10 MG tablet TAKE 1 TABLET BY MOUTH DAILY 08/18/17  Yes Glean Hess, MD  Calcitriol (VECTICAL) 3 MCG/GM cream Apply topically. 11/11/11  Yes [provider]  Cetirizine HCl 10 MG CAPS Take 1 capsule by mouth daily.   Yes [provider]  cholestyramine light (PREVALITE) 4 g packet MIX 1 PACKET AS DIRECTED AND TAKE ONCE DAILY 08/24/17  Yes Glean Hess, MD  cromolyn (NASALCROM) 5.2 MG/ACT nasal spray Place 1 spray into both nostrils 4 (four) times daily.   Yes [provider]  cyclobenzaprine (FLEXERIL) 5 MG tablet TAKE 3 TABLETS BY MOUTH TWO TIMES DAILY 06/08/17  Yes Glean Hess, MD  diphenhydrAMINE (BENADRYL) 25 mg capsule Take by mouth.   Yes [provider]  doxazosin (CARDURA) 2 MG tablet TAKE 1 TABLET BY MOUTH DAILY 10/10/16  Yes Glean Hess, MD  doxepin (SINEQUAN) 100 MG capsule TAKE 1 CAPSULE BY MOUTH ONCE DAILY AT BEDTIME 06/19/17  Yes Glean Hess, MD  esomeprazole (NEXIUM) 40 MG capsule TAKE ONE CAPSULE BY MOUTH AT BEDTIME 05/11/17  Yes Glean Hess, MD  fluticasone [facility]) 50 MCG/ACT nasal spray Place 2 sprays  into the nose daily.   Yes [provider]  levothyroxine (SYNTHROID, LEVOTHROID) 200 MCG tablet TAKE 1 TABLET BY MOUTH DAILY 06/08/17  Yes Glean Hess, MD  lisinopril (PRINIVIL,ZESTRIL) 40 MG tablet TAKE 1 TABLET BY MOUTH DAILY 08/18/17  Yes Glean Hess, MD  loperamide (IMODIUM A-D) 2 MG tablet Take 1 tablet by mouth 3 (three) times daily as needed.   Yes [provider]  meloxicam (MOBIC) 15 MG tablet TAKE 1 TABLET BY MOUTH DAILY 10/06/16  Yes Glean Hess, MD  montelukast (SINGULAIR) 10 MG tablet TAKE 1 TABLET BY MOUTH NIGHTLY AT BEDTIME 06/19/17  Yes Glean Hess, MD  traMADol (ULTRAM) 50 MG tablet Take 1 tablet (50 mg total) by mouth every 12 (twelve) hours as needed. 09/16/16  Yes Glean Hess, MD  DULoxetine (CYMBALTA) 30 MG capsule TAKE 3 CAPSULES BY MOUTH DAILY Patient not taking: Reported on 11/12/2017 04/17/17   Glean Hess, MD  Multiple Vitamins-Iron (MULTIPLE VITAMIN/IRON PO) Take by mouth. Reported on 07/31/2015    [provider]  Suvorexant (BELSOMRA) 15 MG TABS Take 15 mg by mouth at bedtime. Patient not taking: Reported on 11/12/2017 09/16/16   Glean Hess, MD    Allergies  Allergen Reactions  . Amlodipine Swelling    Leg swelling  . Furosemide Swelling  . Lyrica [Pregabalin] Other (See Comments)    Dizziness and blurred vision  .  Sulfa Antibiotics Other (See Comments)    seizure  . Hydrochlorothiazide Itching and Rash    Past Surgical History:  Procedure Laterality Date  . APPENDECTOMY    . BREAST BIOPSY Left 2007  . CHOLECYSTECTOMY    . SKIN CANCER EXCISION     BCCA of face  . TONSILECTOMY, ADENOIDECTOMY, BILATERAL MYRINGOTOMY AND TUBES      Social History   Tobacco Use  . Smoking status: Never Smoker  . Smokeless tobacco: Never Used  Substance Use Topics  . Alcohol use: Yes    Alcohol/week: 1.2 oz    Types: 2 Standard drinks or equivalent per week  . Drug use: No     Medication list has been  reviewed and updated.  Current Meds  Medication Sig  . albuterol (PROVENTIL HFA;VENTOLIN HFA) 108 (90 BASE) MCG/ACT inhaler Inhale 2 puffs into the lungs 4 (four) times daily as needed.  Marland Kitchen aspirin 81 MG EC tablet Take 1 tablet by mouth daily.  . budesonide-formoterol (SYMBICORT) 160-4.5 MCG/ACT inhaler Inhale 1 puff into the lungs 2 (two) times daily.  Marland Kitchen BYSTOLIC 10 MG tablet TAKE 1 TABLET BY MOUTH DAILY  . Calcitriol (VECTICAL) 3 MCG/GM cream Apply topically.  . Cetirizine HCl 10 MG CAPS Take 1 capsule by mouth daily.  . cholestyramine light (PREVALITE) 4 g packet MIX 1 PACKET AS DIRECTED AND TAKE ONCE DAILY  . cromolyn (NASALCROM) 5.2 MG/ACT nasal spray Place 1 spray into both nostrils 4 (four) times daily.  . cyclobenzaprine (FLEXERIL) 5 MG tablet TAKE 3 TABLETS BY MOUTH TWO TIMES DAILY  . diphenhydrAMINE (BENADRYL) 25 mg capsule Take by mouth.  . doxazosin (CARDURA) 2 MG tablet TAKE 1 TABLET BY MOUTH DAILY  . doxepin (SINEQUAN) 100 MG capsule TAKE 1 CAPSULE BY MOUTH ONCE DAILY AT BEDTIME  . esomeprazole (NEXIUM) 40 MG capsule TAKE ONE CAPSULE BY MOUTH AT BEDTIME  . fluticasone (FLONASE) 50 MCG/ACT nasal spray Place 2 sprays into the nose daily.  Marland Kitchen levothyroxine (SYNTHROID, LEVOTHROID) 200 MCG tablet TAKE 1 TABLET BY MOUTH DAILY  . lisinopril (PRINIVIL,ZESTRIL) 40 MG tablet TAKE 1 TABLET BY MOUTH DAILY  . loperamide (IMODIUM A-D) 2 MG tablet Take 1 tablet by mouth 3 (three) times daily as needed.  . meloxicam (MOBIC) 15 MG tablet TAKE 1 TABLET BY MOUTH DAILY  . montelukast (SINGULAIR) 10 MG tablet TAKE 1 TABLET BY MOUTH NIGHTLY AT BEDTIME  . traMADol (ULTRAM) 50 MG tablet Take 1 tablet (50 mg total) by mouth every 12 (twelve) hours as needed.    PHQ 2/9 Scores 11/12/2017 03/02/2017  PHQ - 2 Score 4 4  PHQ- 9 Score 14 10    Physical Exam  BP (!) 160/104   Pulse 94   Temp 98.1 F (36.7 C) (Oral)   Resp 16   Ht 5' 1.5" (1.562 m)   Wt 232 lb (105.2 kg)   SpO2 97%   BMI 43.13  kg/m   Assessment and Plan: 1. Annual physical exam Discussed diet and weight loss options Pap due next year - Lipid panel - POCT urinalysis dipstick  2. Breast cancer screening Pt to schedule mammogram at [facility]  3. Fibromyalgia syndrome Samples of Horizant 300 mg at bedtime Continue PRN tramadol  4. Arthritis of carpometacarpal (CMC) joint of thumb Chronic - possibly psoriatic arthritis but would need specialist evaluation mobic refilled  5. Essential (primary) hypertension Not controlled today due to being out of Cardura - resume Recheck BP 2 months - doxazosin (CARDURA) 2 MG  tablet; Take 1 tablet (2 mg total) by mouth daily.  Dispense: 90 tablet; Refill: 3 - CBC with Differential/Platelet - Comprehensive metabolic panel  6. Acquired hypothyroidism supplemented - TSH  7. Obstructive apnea Try to obtain mask from the internet Otherwise, will need a new sleep study  8. Pre-diabetes Diet and weight loss needed to avoid progression - Hemoglobin A1c  9. Chronic peptic ulcer Continue nexium daily Adjust medication administration and eating patterns to avoid dysphagia If worsening, will need to see G  10. Mixed hyperlipidemia Check labs and advise  11. Insomnia, unspecified type Samples and short Rx of Belsomra given   Meds ordered this encounter  Medications  . levothyroxine (SYNTHROID, LEVOTHROID) 200 MCG tablet    Sig: Take 1 tablet (200 mcg total) by mouth daily.    Dispense:  90 tablet    Refill:  3  . meloxicam (MOBIC) 15 MG tablet    Sig: Take 1 tablet (15 mg total) by mouth daily.    Dispense:  90 tablet    Refill:  3  . esomeprazole (NEXIUM) 40 MG capsule    Sig: Take 1 capsule (40 mg total) by mouth at bedtime.    Dispense:  90 capsule    Refill:  3  . doxazosin (CARDURA) 2 MG tablet    Sig: Take 1 tablet (2 mg total) by mouth daily.    Dispense:  90 tablet    Refill:  3  . traMADol (ULTRAM) 50 MG tablet    Sig: Take 1 tablet (50 mg total) by  mouth every 12 (twelve) hours as needed.    Dispense:  60 tablet    Refill:  5  . Suvorexant (BELSOMRA) 15 MG TABS    Sig: Take 15 mg by mouth at bedtime.    Dispense:  10 tablet    Refill:  0    Partially dictated using Editor, commissioning. Any errors are unintentional.  Halina Maidens, MD [facility]  11/12/2017   There are no diagnoses linked to this encounter.

## 2017-11-13 LAB — CBC WITH DIFFERENTIAL/PLATELET
BASOS ABS: 0.1 10*3/uL (ref 0.0–0.2)
Basos: 1 %
EOS (ABSOLUTE): 0.3 10*3/uL (ref 0.0–0.4)
Eos: 3 %
HEMOGLOBIN: 14.5 g/dL (ref 11.1–15.9)
Hematocrit: 43.8 % (ref 34.0–46.6)
IMMATURE GRANS (ABS): 0.1 10*3/uL (ref 0.0–0.1)
IMMATURE GRANULOCYTES: 1 %
LYMPHS: 34 %
Lymphocytes Absolute: 3.8 10*3/uL — ABNORMAL HIGH (ref 0.7–3.1)
MCH: 31.3 pg (ref 26.6–33.0)
MCHC: 33.1 g/dL (ref 31.5–35.7)
MCV: 94 fL (ref 79–97)
MONOCYTES: 5 %
Monocytes Absolute: 0.6 10*3/uL (ref 0.1–0.9)
NEUTROS ABS: 6.3 10*3/uL (ref 1.4–7.0)
NEUTROS PCT: 56 %
PLATELETS: 418 10*3/uL (ref 150–450)
RBC: 4.64 x10E6/uL (ref 3.77–5.28)
RDW: 14.3 % (ref 12.3–15.4)
WBC: 11.1 10*3/uL — ABNORMAL HIGH (ref 3.4–10.8)

## 2017-11-13 LAB — HEMOGLOBIN A1C
Est. average glucose Bld gHb Est-mCnc: 137 mg/dL
Hgb A1c MFr Bld: 6.4 % — ABNORMAL HIGH (ref 4.8–5.6)

## 2017-11-13 LAB — LIPID PANEL
CHOLESTEROL TOTAL: 213 mg/dL — AB (ref 100–199)
Chol/HDL Ratio: 4.4 ratio (ref 0.0–4.4)
HDL: 48 mg/dL (ref >39)
LDL CALC: 122 mg/dL — AB (ref 0–99)
TRIGLYCERIDES: 217 mg/dL — AB (ref 0–149)
VLDL Cholesterol Cal: 43 mg/dL — ABNORMAL HIGH (ref 5–40)

## 2017-11-13 LAB — COMPREHENSIVE METABOLIC PANEL
ALT: 19 IU/L (ref 0–32)
AST: 20 IU/L (ref 0–40)
Albumin/Globulin Ratio: 1.6 (ref 1.2–2.2)
Albumin: 4.4 g/dL (ref 3.5–5.5)
Alkaline Phosphatase: 116 IU/L (ref 39–117)
BUN/Creatinine Ratio: 22 (ref 9–23)
BUN: 12 mg/dL (ref 6–24)
Bilirubin Total: 0.3 mg/dL (ref 0.0–1.2)
CO2: 26 mmol/L (ref 20–29)
Calcium: 9.9 mg/dL (ref 8.7–10.2)
Chloride: 101 mmol/L (ref 96–106)
Creatinine, Ser: 0.54 mg/dL — ABNORMAL LOW (ref 0.57–1.00)
GFR, EST AFRICAN AMERICAN: 120 mL/min/{1.73_m2} (ref >59)
GFR, EST NON AFRICAN AMERICAN: 104 mL/min/{1.73_m2} (ref >59)
GLUCOSE: 137 mg/dL — AB (ref 65–99)
Globulin, Total: 2.7 g/dL (ref 1.5–4.5)
Potassium: 5 mmol/L (ref 3.5–5.2)
Sodium: 142 mmol/L (ref 134–144)
TOTAL PROTEIN: 7.1 g/dL (ref 6.0–8.5)

## 2017-11-13 LAB — TSH: TSH: 1.99 u[IU]/mL (ref 0.450–4.500)

## 2017-12-21 ENCOUNTER — Telehealth: Payer: Self-pay

## 2017-12-21 ENCOUNTER — Other Ambulatory Visit: Payer: Self-pay | Admitting: Internal Medicine

## 2017-12-21 DIAGNOSIS — M797 Fibromyalgia: Secondary | ICD-10-CM

## 2017-12-21 MED ORDER — GABAPENTIN ENACARBIL ER 300 MG PO TBCR: 1.0000 | EXTENDED_RELEASE_TABLET | Freq: Every day | ORAL | 1 refills | Status: DC

## 2017-12-21 NOTE — Telephone Encounter (Signed)
Rx sent to [facility] 

## 2017-12-21 NOTE — Telephone Encounter (Signed)
Patient called and left VM stating she was given samples at her last OV and needs rx called into [place] because she is almost out.  Please Advise.

## 2017-12-21 NOTE — Telephone Encounter (Signed)
Patient informed. 

## 2017-12-23 ENCOUNTER — Telehealth: Payer: Self-pay

## 2017-12-23 NOTE — Telephone Encounter (Signed)
Did prior auth on patient's medication for Horizant through covermymeds. Waiting for outcome to be faxed to [place] within 72 hours.

## 2018-01-18 ENCOUNTER — Ambulatory Visit (INDEPENDENT_AMBULATORY_CARE_PROVIDER_SITE_OTHER): Payer: No Typology Code available for payment source | Admitting: Internal Medicine

## 2018-01-18 ENCOUNTER — Encounter: Payer: Self-pay | Admitting: Internal Medicine

## 2018-01-18 VITALS — BP 120/83 | HR 88 | Resp 16 | Ht 61.5 in | Wt 229.0 lb

## 2018-01-18 DIAGNOSIS — R4586 Emotional lability: Secondary | ICD-10-CM

## 2018-01-18 DIAGNOSIS — Z6841 Body Mass Index (BMI) 40.0 and over, adult: Secondary | ICD-10-CM | POA: Diagnosis not present

## 2018-01-18 DIAGNOSIS — M797 Fibromyalgia: Secondary | ICD-10-CM

## 2018-01-18 DIAGNOSIS — I10 Essential (primary) hypertension: Secondary | ICD-10-CM | POA: Diagnosis not present

## 2018-01-18 MED ORDER — GABAPENTIN 100 MG PO CAPS: 200.0000 mg | ORAL_CAPSULE | Freq: Three times a day (TID) | ORAL | 0 refills | Status: DC

## 2018-01-18 NOTE — Patient Instructions (Signed)
Start gabapentin 100 mg every evening - increase by one capsule every week to a maximum of 600 mg.

## 2018-01-18 NOTE — Progress Notes (Signed)
Date:  01/18/2018   Name:  [name]   DOB:  1961-01-04   MRN:  [id number]   Chief Complaint: Fibromyalgia (2 mo follow up)  Fibromyalgia - had significant improvement on Horizant but insurance will not approve.  Lyrica was not tolerated.  She has not tried gabapentin.  Depression - scored higher on screening.  Under more emotional and physical stress recently.  Husbands mother dx'd with cancer, husband now diabetic, having to transport family to multiple appointments, giving MIL heparin twice a day - has to drive to her house.  Tried cymbalta but did not notice much benefit.  Taking doxepin for mood and sleep.  Hypertension  This is a chronic problem. The problem is controlled. Pertinent negatives include no chest pain, headaches, palpitations or shortness of breath.    Review of Systems  Constitutional: Positive for fatigue. Negative for chills and fever.  HENT: Negative for congestion and hearing loss. Trouble swallowing: intermittent upper throat dysphagia.   Eyes: Negative for visual disturbance.  Respiratory: Negative for cough, chest tightness, shortness of breath and wheezing.   Cardiovascular: Negative for chest pain, palpitations and leg swelling.  Gastrointestinal: Negative for abdominal pain, constipation, diarrhea and vomiting.  Endocrine: Negative for heat intolerance, polydipsia and polyuria.  Genitourinary: Negative for dysuria, frequency, genital sores, vaginal bleeding and vaginal discharge.  Musculoskeletal: Positive for arthralgias and myalgias. Negative for gait problem and joint swelling.  Skin: Negative for color change and rash.  Allergic/Immunologic: Negative for environmental allergies.  Neurological: Positive for numbness (painful electric shocks in feet esp at night/evening). Negative for dizziness, tremors, light-headedness and headaches.  Hematological: Negative for adenopathy. Does not bruise/bleed easily.  Psychiatric/Behavioral: Positive for  sleep disturbance. Negative for dysphoric mood. The patient is not nervous/anxious.     Patient Active Problem List   Diagnosis Date Noted  . Pre-diabetes 09/16/2016  . Insomnia 09/16/2016  . Arthritis of carpometacarpal (CMC) joint of thumb 05/29/2016  . Venous insufficiency of both lower extremities 01/17/2016  . Acquired hypothyroidism 11/10/2014  . Essential (primary) hypertension 11/10/2014  . Fibromyalgia syndrome 11/10/2014  . Mixed hyperlipidemia 11/10/2014  . Irritable bowel syndrome with diarrhea 11/10/2014  . Asthma, mild intermittent 11/10/2014  . BMI 40.0-44.9, adult ([place]) 11/10/2014  . Chronic peptic ulcer 11/10/2014  . Psoriasis 11/10/2014  . Obstructive apnea 11/10/2014  . Chest pain 07/19/2012  . Dermatitis, eczematoid 09/30/2011  . Allergic rhinitis 09/08/2011    Allergies  Allergen Reactions  . Amlodipine Swelling    Leg swelling  . Furosemide Swelling  . Lyrica [Pregabalin] Other (See Comments)    Dizziness and blurred vision  . Sulfa Antibiotics Other (See Comments)    seizure  . Hydrochlorothiazide Itching and Rash    Past Surgical History:  Procedure Laterality Date  . APPENDECTOMY    . BREAST BIOPSY Left 2007  . CHOLECYSTECTOMY    . SKIN CANCER EXCISION     BCCA of face  . TONSILECTOMY, ADENOIDECTOMY, BILATERAL MYRINGOTOMY AND TUBES      Social History   Tobacco Use  . Smoking status: Never Smoker  . Smokeless tobacco: Never Used  Substance Use Topics  . Alcohol use: Yes    Alcohol/week: 2.0 standard drinks    Types: 2 Standard drinks or equivalent per week  . Drug use: No     Medication list has been reviewed and updated.  Current Meds  Medication Sig  . albuterol (PROVENTIL HFA;VENTOLIN HFA) 108 (90 BASE) MCG/ACT inhaler Inhale 2 puffs into  the lungs 4 (four) times daily as needed.  Marland Kitchen aspirin 81 MG EC tablet Take 1 tablet by mouth daily.  . budesonide-formoterol (SYMBICORT) 160-4.5 MCG/ACT inhaler Inhale 1 puff into the lungs  2 (two) times daily.  Marland Kitchen BYSTOLIC 10 MG tablet TAKE 1 TABLET BY MOUTH DAILY  . Calcitriol (VECTICAL) 3 MCG/GM cream Apply topically.  . Cetirizine HCl 10 MG CAPS Take 1 capsule by mouth daily.  . cholestyramine light (PREVALITE) 4 g packet MIX 1 PACKET AS DIRECTED AND TAKE ONCE DAILY  . cromolyn (NASALCROM) 5.2 MG/ACT nasal spray Place 1 spray into both nostrils 4 (four) times daily.  . cyclobenzaprine (FLEXERIL) 5 MG tablet TAKE 3 TABLETS BY MOUTH TWO TIMES DAILY  . diphenhydrAMINE (BENADRYL) 25 mg capsule Take by mouth.  . doxazosin (CARDURA) 2 MG tablet Take 1 tablet (2 mg total) by mouth daily.  Marland Kitchen doxepin (SINEQUAN) 100 MG capsule TAKE 1 CAPSULE BY MOUTH ONCE DAILY AT BEDTIME  . esomeprazole (NEXIUM) 40 MG capsule Take 1 capsule (40 mg total) by mouth at bedtime.  . fluticasone (FLONASE) 50 MCG/ACT nasal spray Place 2 sprays into the nose daily.  Marland Kitchen levothyroxine (SYNTHROID, LEVOTHROID) 200 MCG tablet Take 1 tablet (200 mcg total) by mouth daily.  Marland Kitchen lisinopril (PRINIVIL,ZESTRIL) 40 MG tablet TAKE 1 TABLET BY MOUTH DAILY  . loperamide (IMODIUM A-D) 2 MG tablet Take 1 tablet by mouth 3 (three) times daily as needed.  . meloxicam (MOBIC) 15 MG tablet Take 1 tablet (15 mg total) by mouth daily.  . montelukast (SINGULAIR) 10 MG tablet TAKE 1 TABLET BY MOUTH NIGHTLY AT BEDTIME  . Multiple Vitamins-Iron (MULTIPLE VITAMIN/IRON PO) Take by mouth. Reported on 07/31/2015  . Suvorexant (BELSOMRA) 15 MG TABS Take 15 mg by mouth at bedtime.  . traMADol (ULTRAM) 50 MG tablet Take 1 tablet (50 mg total) by mouth every 12 (twelve) hours as needed.    PHQ 2/9 Scores 11/12/2017 03/02/2017  PHQ - 2 Score 4 4  PHQ- 9 Score 14 10    Physical Exam  Constitutional: She is oriented to person, place, and time. She appears well-developed. No distress.  HENT:  Head: Normocephalic and atraumatic.  Neck: Normal range of motion. Neck supple.  Cardiovascular: Normal rate, regular rhythm and normal heart sounds.    Pulmonary/Chest: Effort normal. No respiratory distress.  Musculoskeletal: Normal range of motion.  Neurological: She is alert and oriented to person, place, and time.  Skin: Skin is warm and dry. No rash noted.  Psychiatric: She has a normal mood and affect. Her behavior is normal. Thought content normal.  Nursing note and vitals reviewed.   BP 120/83   Pulse 88   Resp 16   Ht 5' 1.5" (1.562 m)   Wt 229 lb (103.9 kg)   SpO2 95%   BMI 42.57 kg/m   Assessment and Plan: 1. Fibromyalgia syndrome Will try gabapentin - then pre approve horizant if side effects occur - gabapentin (NEURONTIN) 100 MG capsule; Take 2 capsules (200 mg total) by mouth 3 (three) times daily.  Dispense: 360 capsule; Refill: 0  2. Essential (primary) hypertension controlled  3. Mood changes Due to current family issues Continue Doxepin Did not respond to Cymbalta   Meds ordered this encounter  Medications  . gabapentin (NEURONTIN) 100 MG capsule    Sig: Take 2 capsules (200 mg total) by mouth 3 (three) times daily.    Dispense:  360 capsule    Refill:  0    Partially dictated  using Editor, commissioning. Any errors are unintentional.  Halina Maidens, MD [facility]  01/18/2018

## 2018-01-20 ENCOUNTER — Other Ambulatory Visit: Payer: Self-pay | Admitting: Internal Medicine

## 2018-01-20 DIAGNOSIS — K580 Irritable bowel syndrome with diarrhea: Secondary | ICD-10-CM

## 2018-02-26 ENCOUNTER — Other Ambulatory Visit: Payer: Self-pay | Admitting: Internal Medicine

## 2018-03-30 ENCOUNTER — Other Ambulatory Visit: Payer: Self-pay

## 2018-03-30 DIAGNOSIS — K580 Irritable bowel syndrome with diarrhea: Secondary | ICD-10-CM

## 2018-03-30 MED ORDER — CHOLESTYRAMINE LIGHT 4 G PO PACK: PACK | ORAL | 1 refills | Status: DC

## 2018-06-07 ENCOUNTER — Other Ambulatory Visit: Payer: Self-pay | Admitting: Internal Medicine

## 2018-07-16 ENCOUNTER — Other Ambulatory Visit: Payer: Self-pay | Admitting: Internal Medicine

## 2018-07-19 ENCOUNTER — Other Ambulatory Visit: Payer: Self-pay

## 2018-07-19 ENCOUNTER — Encounter: Payer: Self-pay | Admitting: Internal Medicine

## 2018-07-19 ENCOUNTER — Ambulatory Visit (INDEPENDENT_AMBULATORY_CARE_PROVIDER_SITE_OTHER): Payer: No Typology Code available for payment source | Admitting: Internal Medicine

## 2018-07-19 VITALS — BP 114/68 | HR 64 | Ht 61.5 in | Wt 228.0 lb

## 2018-07-19 DIAGNOSIS — M797 Fibromyalgia: Secondary | ICD-10-CM | POA: Diagnosis not present

## 2018-07-19 DIAGNOSIS — E039 Hypothyroidism, unspecified: Secondary | ICD-10-CM | POA: Diagnosis not present

## 2018-07-19 DIAGNOSIS — M1810 Unilateral primary osteoarthritis of first carpometacarpal joint, unspecified hand: Secondary | ICD-10-CM

## 2018-07-19 DIAGNOSIS — I10 Essential (primary) hypertension: Secondary | ICD-10-CM

## 2018-07-19 DIAGNOSIS — R7303 Prediabetes: Secondary | ICD-10-CM | POA: Diagnosis not present

## 2018-07-19 DIAGNOSIS — Z23 Encounter for immunization: Secondary | ICD-10-CM | POA: Diagnosis not present

## 2018-07-19 DIAGNOSIS — G4700 Insomnia, unspecified: Secondary | ICD-10-CM

## 2018-07-19 DIAGNOSIS — Z1231 Encounter for screening mammogram for malignant neoplasm of breast: Secondary | ICD-10-CM

## 2018-07-19 MED ORDER — TRAZODONE HCL 50 MG PO TABS: 50.0000 mg | ORAL_TABLET | Freq: Every evening | ORAL | 1 refills | Status: DC | PRN

## 2018-07-19 NOTE — Progress Notes (Signed)
Date:  07/19/2018   Name:  [name]   DOB:  1961/05/30   MRN:  [id number]   Chief Complaint: Hypertension; Fibromyalgia; and Depression (11. Tired and not sleeping. Said she may have to be put back on sleeping medication. Sleeping 4-5 hours a night. )  Hypertension  This is a chronic problem. The problem is controlled. Pertinent negatives include no chest pain, headaches, palpitations or shortness of breath. The current treatment provides significant improvement. Compliance problems include exercise.  Identifiable causes of hypertension include a thyroid problem.  Diabetes  She presents for her follow-up diabetic visit. Diabetes type: prediabetes. Her disease course has been stable. Pertinent negatives for hypoglycemia include no headaches or tremors. Associated symptoms include fatigue. Pertinent negatives for diabetes include no chest pain, no polydipsia and no polyuria. Current diabetic treatment includes diet. She is following a generally healthy diet. She never participates in exercise.  Thyroid Problem  Presents for follow-up visit. Symptoms include fatigue. Patient reports no constipation, diarrhea, palpitations or tremors. The symptoms have been stable.  Insomnia  Primary symptoms: sleep disturbance, frequent awakening, napping.  The current episode started more than one year.  Fibromyalgia - started on gabapentin last visit, also on tramadol intermittently.  She could not tolerate the higher dose of 300 mg - now on 200 mg but feels drugged and much less energy. Thumb arthritis - getting worse, esp on the right.  Unable to uncap jars, etc.  Has not seen orthopedics due to cost.  Taking tramadol intermittently for severe pain.  Lab Results  Component Value Date   HGBA1C 6.4 (H) 11/12/2017   Lab Results  Component Value Date   TSH 1.990 11/12/2017   Lab Results  Component Value Date   CREATININE 0.54 (L) 11/12/2017   BUN 12 11/12/2017   NA 142 11/12/2017   K 5.0  11/12/2017   CL 101 11/12/2017   CO2 26 11/12/2017     Review of Systems  Constitutional: Positive for fatigue. Negative for appetite change, fever and unexpected weight change.  HENT: Negative for tinnitus and trouble swallowing.   Eyes: Negative for visual disturbance.  Respiratory: Negative for cough, chest tightness and shortness of breath.   Cardiovascular: Negative for chest pain, palpitations and leg swelling.  Gastrointestinal: Negative for abdominal pain, constipation and diarrhea.  Endocrine: Negative for polydipsia and polyuria.  Genitourinary: Negative for dysuria and hematuria.  Musculoskeletal: Positive for arthralgias (thumbs and index finger right hand) and myalgias. Negative for back pain.  Skin: Negative for rash.  Neurological: Negative for tremors, numbness and headaches.  Psychiatric/Behavioral: Positive for sleep disturbance. Negative for dysphoric mood. The patient has insomnia.     Patient Active Problem List   Diagnosis Date Noted  . Pre-diabetes 09/16/2016  . Insomnia 09/16/2016  . Arthritis of carpometacarpal (CMC) joint of thumb 05/29/2016  . Venous insufficiency of both lower extremities 01/17/2016  . Acquired hypothyroidism 11/10/2014  . Essential (primary) hypertension 11/10/2014  . Fibromyalgia syndrome 11/10/2014  . Mixed hyperlipidemia 11/10/2014  . Irritable bowel syndrome with diarrhea 11/10/2014  . Asthma, mild intermittent 11/10/2014  . BMI 40.0-44.9, adult ([place]) 11/10/2014  . Chronic peptic ulcer 11/10/2014  . Psoriasis 11/10/2014  . Obstructive apnea 11/10/2014  . Chest pain 07/19/2012  . Dermatitis, eczematoid 09/30/2011  . Allergic rhinitis 09/08/2011    Allergies  Allergen Reactions  . Amlodipine Swelling    Leg swelling  . Furosemide Swelling  . Lyrica [Pregabalin] Other (See Comments)    Dizziness and  blurred vision  . Sulfa Antibiotics Other (See Comments)    seizure  . Hydrochlorothiazide Itching and Rash    Past  Surgical History:  Procedure Laterality Date  . APPENDECTOMY    . BREAST BIOPSY Left 2007  . CHOLECYSTECTOMY    . SKIN CANCER EXCISION     BCCA of face  . TONSILECTOMY, ADENOIDECTOMY, BILATERAL MYRINGOTOMY AND TUBES      Social History   Tobacco Use  . Smoking status: Never Smoker  . Smokeless tobacco: Never Used  Substance Use Topics  . Alcohol use: Yes    Alcohol/week: 2.0 standard drinks    Types: 2 Standard drinks or equivalent per week  . Drug use: No     Medication list has been reviewed and updated.  Current Meds  Medication Sig  . albuterol (PROVENTIL HFA;VENTOLIN HFA) 108 (90 BASE) MCG/ACT inhaler Inhale 2 puffs into the lungs 4 (four) times daily as needed.  Marland Kitchen aspirin 81 MG EC tablet Take 1 tablet by mouth daily.  Marland Kitchen BYSTOLIC 10 MG tablet TAKE 1 TABLET BY MOUTH DAILY  . Cetirizine HCl 10 MG CAPS Take 1 capsule by mouth daily.  . cholestyramine light (PREVALITE) 4 g packet MIX 1 PACKET AS DIRECTED AND TAKE ONCE DAILY  . cromolyn (NASALCROM) 5.2 MG/ACT nasal spray Place 1 spray into both nostrils 4 (four) times daily.  . cyclobenzaprine (FLEXERIL) 5 MG tablet TAKE 3 TABLETS BY MOUTH TWO TIMES DAILY  . diphenhydrAMINE (BENADRYL) 25 mg capsule Take by mouth.  . doxazosin (CARDURA) 2 MG tablet Take 1 tablet (2 mg total) by mouth daily.  Marland Kitchen doxepin (SINEQUAN) 100 MG capsule TAKE 1 CAPSULE BY MOUTH ONCE DAILY AT BEDTIME  . esomeprazole (NEXIUM) 40 MG capsule Take 1 capsule (40 mg total) by mouth at bedtime.  . fluticasone (FLONASE) 50 MCG/ACT nasal spray Place 2 sprays into the nose daily.  Marland Kitchen gabapentin (NEURONTIN) 100 MG capsule Take 2 capsules (200 mg total) by mouth 3 (three) times daily. (Patient taking differently: Take 200 mg by mouth daily. )  . levothyroxine (SYNTHROID, LEVOTHROID) 200 MCG tablet Take 1 tablet (200 mcg total) by mouth daily.  Marland Kitchen lisinopril (PRINIVIL,ZESTRIL) 40 MG tablet TAKE 1 TABLET BY MOUTH DAILY  . loperamide (IMODIUM A-D) 2 MG tablet Take 1  tablet by mouth 3 (three) times daily as needed.  . meloxicam (MOBIC) 15 MG tablet Take 1 tablet (15 mg total) by mouth daily.  . montelukast (SINGULAIR) 10 MG tablet TAKE 1 TABLET BY MOUTH NIGHTLY AT BEDTIME  . Multiple Vitamins-Iron (MULTIPLE VITAMIN/IRON PO) Take by mouth. Reported on 07/31/2015  . traMADol (ULTRAM) 50 MG tablet TAKE 1 TABLET BY MOUTH EVERY 12 HOURS AS NEEDED    PHQ 2/9 Scores 07/19/2018 11/12/2017 03/02/2017  PHQ - 2 Score 4 4 4   PHQ- 9 Score 11 14 10     Physical Exam Vitals signs and nursing note reviewed.  Constitutional:      General: She is not in acute distress.    Appearance: She is well-developed.  HENT:     Head: Normocephalic and atraumatic.  Eyes:     Pupils: Pupils are equal, round, and reactive to light.  Neck:     Musculoskeletal: Normal range of motion and neck supple.     Vascular: No carotid bruit.  Cardiovascular:     Rate and Rhythm: Normal rate and regular rhythm.     Pulses: Normal pulses.  Pulmonary:     Effort: Pulmonary effort is normal. No respiratory  distress.     Breath sounds: Normal breath sounds. No wheezing.  Musculoskeletal:       Arms:     Right lower leg: No edema.     Left lower leg: No edema.  Lymphadenopathy:     Cervical: No cervical adenopathy.  Skin:    General: Skin is warm and dry.     Findings: No rash.  Neurological:     Mental Status: She is alert and oriented to person, place, and time.     Cranial Nerves: Cranial nerves are intact.     Motor: Motor function is intact.     Deep Tendon Reflexes:     Reflex Scores:      Bicep reflexes are 1+ on the right side and 1+ on the left side.      Patellar reflexes are 1+ on the right side and 1+ on the left side. Psychiatric:        Behavior: Behavior normal.        Thought Content: Thought content normal.     BP 114/68   Pulse 64   Ht 5' 1.5" (1.562 m)   Wt 228 lb (103.4 kg)   SpO2 95%   BMI 42.38 kg/m   Assessment and Plan: 1. Essential (primary)  hypertension controlled - Comprehensive metabolic panel  2. Pre-diabetes Check labs Continue to work on low carb diet - Hemoglobin A1c  3. Acquired hypothyroidism supplemented - TSH  4. Fibromyalgia syndrome Did not tolerate lyrica Taper and stop gabapentin Consider another PA for [employer]  5. Encounter for screening mammogram for breast cancer - MM 3D SCREEN BREAST BILATERAL; Future  6. Insomnia, unspecified type Will resume trazodone - up to 100 mg a HS - traZODone (DESYREL) 50 MG tablet; Take 1-2 tablets (50-100 mg total) by mouth at bedtime as needed for sleep.  Dispense: 180 tablet; Refill: 1  7. Arthritis of carpometacarpal [facility]) joint of thumb Worsening Call for ortho referral when ready   Partially dictated using Dragon software. Any errors are unintentional.  Halina Maidens, MD [facility]  07/19/2018

## 2018-07-20 LAB — COMPREHENSIVE METABOLIC PANEL
ALT: 17 IU/L (ref 0–32)
AST: 19 IU/L (ref 0–40)
Albumin/Globulin Ratio: 1.7 (ref 1.2–2.2)
Albumin: 4.2 g/dL (ref 3.8–4.9)
Alkaline Phosphatase: 107 IU/L (ref 39–117)
BUN/Creatinine Ratio: 22 (ref 9–23)
BUN: 13 mg/dL (ref 6–24)
Bilirubin Total: 0.2 mg/dL (ref 0.0–1.2)
CO2: 27 mmol/L (ref 20–29)
Calcium: 9.4 mg/dL (ref 8.7–10.2)
Chloride: 98 mmol/L (ref 96–106)
Creatinine, Ser: 0.6 mg/dL (ref 0.57–1.00)
GFR calc Af Amer: 116 mL/min/{1.73_m2} (ref >59)
GFR calc non Af Amer: 101 mL/min/{1.73_m2} (ref >59)
Globulin, Total: 2.5 g/dL (ref 1.5–4.5)
Glucose: 103 mg/dL — ABNORMAL HIGH (ref 65–99)
POTASSIUM: 4.4 mmol/L (ref 3.5–5.2)
Sodium: 140 mmol/L (ref 134–144)
Total Protein: 6.7 g/dL (ref 6.0–8.5)

## 2018-07-20 LAB — HEMOGLOBIN A1C
Est. average glucose Bld gHb Est-mCnc: 128 mg/dL
Hgb A1c MFr Bld: 6.1 % — ABNORMAL HIGH (ref 4.8–5.6)

## 2018-07-20 LAB — TSH: TSH: 0.798 u[IU]/mL (ref 0.450–4.500)

## 2018-07-22 ENCOUNTER — Ambulatory Visit: Payer: No Typology Code available for payment source | Admitting: Internal Medicine

## 2018-08-25 ENCOUNTER — Other Ambulatory Visit: Payer: Self-pay

## 2018-08-25 ENCOUNTER — Ambulatory Visit (INDEPENDENT_AMBULATORY_CARE_PROVIDER_SITE_OTHER): Payer: No Typology Code available for payment source | Admitting: Internal Medicine

## 2018-08-25 ENCOUNTER — Encounter: Payer: Self-pay | Admitting: Internal Medicine

## 2018-08-25 VITALS — BP 142/70 | HR 78 | Ht 61.5 in | Wt 222.0 lb

## 2018-08-25 DIAGNOSIS — S161XXA Strain of muscle, fascia and tendon at neck level, initial encounter: Secondary | ICD-10-CM | POA: Diagnosis not present

## 2018-08-25 DIAGNOSIS — M62838 Other muscle spasm: Secondary | ICD-10-CM

## 2018-08-25 NOTE — Patient Instructions (Addendum)
Increased Tramadol to 50 mg four times a day as needed Continue flexeril, mobic and ice/heat  Return if no improvement for consideration of xrays
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
## 2018-09-03 ENCOUNTER — Other Ambulatory Visit: Payer: Self-pay | Admitting: Internal Medicine

## 2018-09-06 ENCOUNTER — Other Ambulatory Visit: Payer: Self-pay | Admitting: Internal Medicine

## 2018-10-13 ENCOUNTER — Other Ambulatory Visit: Payer: Self-pay | Admitting: Internal Medicine

## 2018-10-13 DIAGNOSIS — K580 Irritable bowel syndrome with diarrhea: Secondary | ICD-10-CM

## 2018-11-16 ENCOUNTER — Other Ambulatory Visit: Payer: Self-pay | Admitting: Internal Medicine

## 2018-11-18 ENCOUNTER — Ambulatory Visit (INDEPENDENT_AMBULATORY_CARE_PROVIDER_SITE_OTHER): Payer: No Typology Code available for payment source | Admitting: Internal Medicine

## 2018-11-18 ENCOUNTER — Other Ambulatory Visit: Payer: Self-pay

## 2018-11-18 ENCOUNTER — Encounter: Payer: Self-pay | Admitting: Internal Medicine

## 2018-11-18 VITALS — BP 124/82 | HR 82 | Resp 16 | Ht 61.5 in | Wt 222.0 lb

## 2018-11-18 DIAGNOSIS — G4733 Obstructive sleep apnea (adult) (pediatric): Secondary | ICD-10-CM

## 2018-11-18 DIAGNOSIS — J3089 Other allergic rhinitis: Secondary | ICD-10-CM

## 2018-11-18 DIAGNOSIS — R7303 Prediabetes: Secondary | ICD-10-CM

## 2018-11-18 DIAGNOSIS — I10 Essential (primary) hypertension: Secondary | ICD-10-CM | POA: Diagnosis not present

## 2018-11-18 DIAGNOSIS — E039 Hypothyroidism, unspecified: Secondary | ICD-10-CM | POA: Diagnosis not present

## 2018-11-18 DIAGNOSIS — J4520 Mild intermittent asthma, uncomplicated: Secondary | ICD-10-CM | POA: Diagnosis not present

## 2018-11-18 DIAGNOSIS — Z6841 Body Mass Index (BMI) 40.0 and over, adult: Secondary | ICD-10-CM

## 2018-11-18 DIAGNOSIS — Z0000 Encounter for general adult medical examination without abnormal findings: Secondary | ICD-10-CM

## 2018-11-18 DIAGNOSIS — G4700 Insomnia, unspecified: Secondary | ICD-10-CM

## 2018-11-18 DIAGNOSIS — Z1231 Encounter for screening mammogram for malignant neoplasm of breast: Secondary | ICD-10-CM | POA: Diagnosis not present

## 2018-11-18 DIAGNOSIS — N950 Postmenopausal bleeding: Secondary | ICD-10-CM

## 2018-11-18 LAB — POCT URINALYSIS DIPSTICK
Bilirubin, UA: NEGATIVE
Blood, UA: NEGATIVE
Glucose, UA: NEGATIVE
Ketones, UA: NEGATIVE
Nitrite, UA: NEGATIVE
Protein, UA: NEGATIVE
Spec Grav, UA: 1.015 (ref 1.010–1.025)
Urobilinogen, UA: 0.2 E.U./dL
pH, UA: 6 (ref 5.0–8.0)

## 2018-11-18 NOTE — Progress Notes (Signed)
Date:  11/18/2018   Name:  [name]   DOB:  1961-04-18   MRN:  [id number]   Chief Complaint: Annual Exam (no pap) [name] is a 58 y.o. female who presents today for [name] Complete Annual Exam. She feels fairly well. She reports exercising none. She reports she is sleeping fairly well on medication. She denies breast issues.  Due for Tdap Pap 2018 Colonoscopy 2014 Mammogram to be scheduled   Hypertension This is a chronic problem. The problem is controlled. Associated symptoms include headaches and neck pain. Pertinent negatives include no chest pain, palpitations or shortness of breath. Past treatments include beta blockers and ACE inhibitors. The current treatment provides significant improvement. There are no compliance problems.  Identifiable causes of hypertension include a thyroid problem.  Asthma There is no cough, shortness of breath or wheezing. This is a recurrent (mild, intermittent) problem. The problem has been unchanged. Associated symptoms include headaches and myalgias. Pertinent negatives include no chest pain, fever or trouble swallowing. [name] symptoms are alleviated by beta-agonist and leukotriene antagonist. [name] past medical history is significant for asthma.  Thyroid Problem Presents for follow-up visit. Symptoms include diarrhea and menstrual problem (intermittent spotting). Patient reports no anxiety, constipation, fatigue, palpitations or tremors. The symptoms have been stable. [name] past medical history is significant for hyperlipidemia.  Insomnia Primary symptoms: no sleep disturbance.  The problem occurs nightly. Past treatments include medication (trazodone but does not take it if she misses taking is earlier). The treatment provided moderate relief.  Diabetes She presents for [name] follow-up diabetic visit. Diabetes type: pre-diabetes. Hypoglycemia symptoms include headaches. Pertinent negatives for hypoglycemia include no dizziness,  nervousness/anxiousness or tremors. Pertinent negatives for diabetes include no chest pain, no fatigue, no polydipsia, no polyuria and no weakness.  Hyperlipidemia This is a chronic problem. Associated symptoms include myalgias. Pertinent negatives include no chest pain or shortness of breath.  OSA - using CPAP nightly but needs a new mask. Shoulder pain - she had severe shoulder pain several months ago that radiated to [name] elbow.  She did not want to go out due to the pandemic so she used tramadol, mobic and immobilization.  It is much better but still painful to bear much weight. She is also having persistent and worsening pain in [name] thumbs and DIP joints. She is not ready yet to see orthopedics. Vaginal bleeding - pt reports frequent episodes of vaginal spotting last a few days, occurring every several months.  She denies pelvic pain, passing clots, vaginal pain or itching.  Lab Results  Component Value Date   CREATININE 0.60 07/19/2018   BUN 13 07/19/2018   NA 140 07/19/2018   K 4.4 07/19/2018   CL 98 07/19/2018   CO2 27 07/19/2018   Lab Results  Component Value Date   CHOL 213 (H) 11/12/2017   HDL 48 11/12/2017   LDLCALC 122 (H) 11/12/2017   TRIG 217 (H) 11/12/2017   CHOLHDL 4.4 11/12/2017   Lab Results  Component Value Date   TSH 0.798 07/19/2018   Lab Results  Component Value Date   HGBA1C 6.1 (H) 07/19/2018     Review of Systems  Constitutional: Negative for chills, fatigue and fever.  HENT: Negative for hearing loss, sinus pressure and trouble swallowing.   Eyes: Negative for visual disturbance.  Respiratory: Negative for cough, chest tightness, shortness of breath and wheezing.   Cardiovascular: Negative for chest pain and palpitations.  Gastrointestinal: Positive for diarrhea. Negative for abdominal pain,  blood in stool and constipation.  Endocrine: Negative for polydipsia and polyuria.  Genitourinary: Positive for menstrual problem (intermittent spotting).  Negative for dysuria.  Musculoskeletal: Positive for arthralgias (shoulder and elbow), myalgias and neck pain.  Skin: Negative for color change and wound.  Allergic/Immunologic: Positive for environmental allergies.  Neurological: Positive for numbness (tingling in feet intermittent) and headaches. Negative for dizziness, tremors, weakness and light-headedness.  Hematological: Negative for adenopathy. Does not bruise/bleed easily.  Psychiatric/Behavioral: Negative for dysphoric mood and sleep disturbance. The patient has insomnia. The patient is not nervous/anxious.     Patient Active Problem List   Diagnosis Date Noted  . Pre-diabetes 09/16/2016  . Insomnia 09/16/2016  . Arthritis of carpometacarpal (CMC) joint of thumb 05/29/2016  . Venous insufficiency of both lower extremities 01/17/2016  . Acquired hypothyroidism 11/10/2014  . Essential (primary) hypertension 11/10/2014  . Fibromyalgia syndrome 11/10/2014  . Mixed hyperlipidemia 11/10/2014  . Irritable bowel syndrome with diarrhea 11/10/2014  . Asthma, mild intermittent 11/10/2014  . BMI 40.0-44.9, adult ([place]) 11/10/2014  . Chronic peptic ulcer 11/10/2014  . Psoriasis 11/10/2014  . Obstructive apnea 11/10/2014  . Chest pain 07/19/2012  . Environmental and seasonal allergies 09/08/2011    Allergies  Allergen Reactions  . Amlodipine Swelling    Leg swelling  . Furosemide Swelling  . Lyrica [Pregabalin] Other (See Comments)    Dizziness and blurred vision  . Sulfa Antibiotics Other (See Comments)    seizure  . Hydrochlorothiazide Itching and Rash    Past Surgical History:  Procedure Laterality Date  . APPENDECTOMY    . BREAST BIOPSY Left 2007  . CHOLECYSTECTOMY    . SKIN CANCER EXCISION     BCCA of face  . TONSILECTOMY, ADENOIDECTOMY, BILATERAL MYRINGOTOMY AND TUBES      Social History   Tobacco Use  . Smoking status: Never Smoker  . Smokeless tobacco: Never Used  Substance Use Topics  . Alcohol use: Yes     Alcohol/week: 2.0 standard drinks    Types: 2 Standard drinks or equivalent per week  . Drug use: No     Medication list has been reviewed and updated.  Current Meds  Medication Sig  . albuterol (PROVENTIL HFA;VENTOLIN HFA) 108 (90 BASE) MCG/ACT inhaler Inhale 2 puffs into the lungs 4 (four) times daily as needed.  Marland Kitchen aspirin 81 MG EC tablet Take 1 tablet by mouth daily.  Marland Kitchen BYSTOLIC 10 MG tablet TAKE 1 TABLET BY MOUTH DAILY  . Cetirizine HCl 10 MG CAPS Take 1 capsule by mouth daily.  . cholestyramine light (PREVALITE) 4 g packet MIX 1 PACKET AS DIRECTED AND DRINK ONCE DAILY  . cyclobenzaprine (FLEXERIL) 5 MG tablet TAKE 3 TABLETS BY MOUTH TWO TIMES DAILY  . doxazosin (CARDURA) 2 MG tablet Take 1 tablet (2 mg total) by mouth daily.  Marland Kitchen doxepin (SINEQUAN) 100 MG capsule TAKE 1 CAPSULE BY MOUTH ONCE DAILY AT BEDTIME  . esomeprazole (NEXIUM) 40 MG capsule Take 1 capsule (40 mg total) by mouth at bedtime.  Marland Kitchen levothyroxine (SYNTHROID, LEVOTHROID) 200 MCG tablet Take 1 tablet (200 mcg total) by mouth daily.  Marland Kitchen lisinopril (PRINIVIL,ZESTRIL) 40 MG tablet TAKE 1 TABLET BY MOUTH DAILY  . loperamide (IMODIUM A-D) 2 MG tablet Take 1 tablet by mouth 3 (three) times daily as needed.  . meloxicam (MOBIC) 15 MG tablet TAKE 1 TABLET (15 MG TOTAL) BY MOUTH DAILY.  . montelukast (SINGULAIR) 10 MG tablet TAKE 1 TABLET BY MOUTH NIGHTLY AT BEDTIME  . Multiple Vitamins-Iron (  MULTIPLE VITAMIN/IRON PO) Take by mouth. Reported on 07/31/2015  . traMADol (ULTRAM) 50 MG tablet TAKE 1 TABLET BY MOUTH EVERY 12 HOURS AS NEEDED  . traZODone (DESYREL) 50 MG tablet Take 1-2 tablets (50-100 mg total) by mouth at bedtime as needed for sleep.  . vitamin B-12 (CYANOCOBALAMIN) 500 MCG tablet Take 500 mcg by mouth daily.    PHQ 2/9 Scores 11/18/2018 07/19/2018 11/12/2017 03/02/2017  PHQ - 2 Score 0 4 4 4   PHQ- 9 Score 2 11 14 10     BP Readings from Last 3 Encounters:  11/18/18 124/82  08/25/18 (!) 142/70  07/19/18 114/68     Physical Exam Vitals signs and nursing note reviewed.  Constitutional:      General: She is not in acute distress.    Appearance: She is well-developed. She is obese.  HENT:     Head: Normocephalic and atraumatic.     Right Ear: Tympanic membrane and ear canal normal.     Left Ear: Tympanic membrane and ear canal normal.     Nose:     Right Sinus: No maxillary sinus tenderness.     Left Sinus: No maxillary sinus tenderness.     Mouth/Throat:     Mouth: Mucous membranes are moist.     Pharynx: Uvula midline.  Eyes:     General: No scleral icterus.       Right eye: No discharge.        Left eye: No discharge.     Conjunctiva/sclera: Conjunctivae normal.  Neck:     Musculoskeletal: Normal range of motion. No erythema.     Thyroid: No thyromegaly.     Vascular: No carotid bruit.  Cardiovascular:     Rate and Rhythm: Normal rate and regular rhythm.  No extrasystoles are present.    Pulses: Normal pulses.          Radial pulses are 2+ on the right side and 2+ on the left side.       Dorsalis pedis pulses are 2+ on the right side and 2+ on the left side.     Heart sounds: Normal heart sounds.  Pulmonary:     Effort: Pulmonary effort is normal. No respiratory distress.     Breath sounds: No wheezing.  Chest:     Breasts:        Right: No mass, nipple discharge, skin change or tenderness.        Left: No mass, nipple discharge, skin change or tenderness.  Abdominal:     General: Bowel sounds are normal.     Palpations: Abdomen is soft.     Tenderness: There is no abdominal tenderness. There is no guarding or rebound.  Genitourinary:    Comments: GU exam deferred Musculoskeletal:     Right lower leg: Edema present.     Left lower leg: Edema present.     Comments: Heberden's nodes on several fingers Mild thenar atrophy Tender posterior right shoulder, mild pain to ROM Grip intack  Lymphadenopathy:     Cervical: No cervical adenopathy.  Skin:    General: Skin is warm and  dry.     Capillary Refill: Capillary refill takes less than 2 seconds.     Findings: No rash.  Neurological:     Mental Status: She is alert and oriented to person, place, and time.     Cranial Nerves: No cranial nerve deficit.     Sensory: No sensory deficit.     Deep Tendon Reflexes: Reflexes  are normal and symmetric.  Psychiatric:        Attention and Perception: Attention normal.        Mood and Affect: Mood normal.        Speech: Speech normal.        Behavior: Behavior normal.        Thought Content: Thought content normal.        Cognition and Memory: Cognition normal.     Wt Readings from Last 3 Encounters:  11/18/18 222 lb (100.7 kg)  08/25/18 222 lb (100.7 kg)  07/19/18 228 lb (103.4 kg)    BP 124/82   Pulse 82   Resp 16   Ht 5' 1.5" (1.562 m)   Wt 222 lb (100.7 kg)   SpO2 94%   BMI 41.27 kg/m   Assessment and Plan: 1. Annual physical exam Normal exam except for weight Continue efforts at diet change and weight loss - Lipid panel - POCT urinalysis dipstick  2. Encounter for screening mammogram for breast cancer To be scheduled  3. Essential (primary) hypertension controlled - CBC with Differential/Platelet - Comprehensive metabolic panel  4. Acquired hypothyroidism supplemented - TSH + free T4  5. Mild intermittent asthma without complication Stable; continue albuterol PRN  6. Obstructive apnea Continue nightly CPAP use Consider getting replacement mask from the internet  7. Environmental and seasonal allergies On zyrtec and singulair  8. BMI 40.0-44.9, adult ([place]) Diet and weight loss advised  9. Pre-diabetes Check labs - Hemoglobin A1c  10. Insomnia, unspecified type Recommend using trazodone nightly  11. Post-menopausal bleeding Needs [place] and GYN referral if abnormal - US Pelvic Complete With Transvaginal; Future   Partially dictated using Dragon software. Any errors are unintentional.  Halina Maidens, MD [facility]  11/18/2018

## 2018-11-18 NOTE — Patient Instructions (Signed)
Health Maintenance for Postmenopausal Women Menopause is a normal process in which your reproductive ability comes to an end. This process happens gradually over a span of months to years, usually between the ages of 62 and 89. Menopause is complete when you have missed 12 consecutive menstrual periods. It is important to talk with your health care provider about some of the most common conditions that affect postmenopausal women, such as heart disease, cancer, and bone loss (osteoporosis). Adopting a healthy lifestyle and getting preventive care can help to promote your health and wellness. Those actions can also lower your chances of developing some of these common conditions. What should I know about menopause? During menopause, you may experience a number of symptoms, such as:  Moderate-to-severe hot flashes.  Night sweats.  Decrease in sex drive.  Mood swings.  Headaches.  Tiredness.  Irritability.  Memory problems.  Insomnia. Choosing to treat or not to treat menopausal changes is an individual decision that you make with your health care provider. What should I know about hormone replacement therapy and supplements? Hormone therapy products are effective for treating symptoms that are associated with menopause, such as hot flashes and night sweats. Hormone replacement carries certain risks, especially as you become older. If you are thinking about using estrogen or estrogen with progestin treatments, discuss the benefits and risks with your health care provider. What should I know about heart disease and stroke? Heart disease, heart attack, and stroke become more likely as you age. This may be due, in part, to the hormonal changes that your body experiences during menopause. These can affect how your body processes dietary fats, triglycerides, and cholesterol. Heart attack and stroke are both medical emergencies. There are many things that you can do to help prevent heart disease  and stroke:  Have your blood pressure checked at least every 1-2 years. High blood pressure causes heart disease and increases the risk of stroke.  If you are 79-72 years old, ask your health care provider if you should take aspirin to prevent a heart attack or a stroke.  Do not use any tobacco products, including cigarettes, chewing tobacco, or electronic cigarettes. If you need help quitting, ask your health care provider.  It is important to eat a healthy diet and maintain a healthy weight. ? Be sure to include plenty of vegetables, fruits, low-fat dairy products, and lean protein. ? Avoid eating foods that are high in solid fats, added sugars, or salt (sodium).  Get regular exercise. This is one of the most important things that you can do for your health. ? Try to exercise for at least 150 minutes each week. The type of exercise that you do should increase your heart rate and make you sweat. This is known as moderate-intensity exercise. ? Try to do strengthening exercises at least twice each week. Do these in addition to the moderate-intensity exercise.  Know your numbers.Ask your health care provider to check your cholesterol and your blood glucose. Continue to have your blood tested as directed by your health care provider.  What should I know about cancer screening? There are several types of cancer. Take the following steps to reduce your risk and to catch any cancer development as early as possible. Breast Cancer  Practice breast self-awareness. ? This means understanding how your breasts normally appear and feel. ? It also means doing regular breast self-exams. Let your health care provider know about any changes, no matter how small.  If you are 40 or  older, have a clinician do a breast exam (clinical breast exam or CBE) every year. Depending on your age, family history, and medical history, it may be recommended that you also have a yearly breast X-ray (mammogram).  If you  have a family history of breast cancer, talk with your health care provider about genetic screening.  If you are at high risk for breast cancer, talk with your health care provider about having an MRI and a mammogram every year.  Breast cancer (BRCA) gene test is recommended for women who have family members with BRCA-related cancers. Results of the assessment will determine the need for genetic counseling and BRCA1 and for BRCA2 testing. BRCA-related cancers include these types: ? Breast. This occurs in males or females. ? Ovarian. ? Tubal. This may also be called fallopian tube cancer. ? Cancer of the abdominal or pelvic lining (peritoneal cancer). ? Prostate. ? Pancreatic. Cervical, Uterine, and Ovarian Cancer Your health care provider may recommend that you be screened regularly for cancer of the pelvic organs. These include your ovaries, uterus, and vagina. This screening involves a pelvic exam, which includes checking for microscopic changes to the surface of your cervix (Pap test).  For women ages 21-65, health care providers may recommend a pelvic exam and a Pap test every three years. For women ages 39-65, they may recommend the Pap test and pelvic exam, combined with testing for human papilloma virus (HPV), every five years. Some types of HPV increase your risk of cervical cancer. Testing for HPV may also be done on women of any age who have unclear Pap test results.  Other health care providers may not recommend any screening for nonpregnant women who are considered low risk for pelvic cancer and have no symptoms. Ask your health care provider if a screening pelvic exam is right for you.  If you have had past treatment for cervical cancer or a condition that could lead to cancer, you need Pap tests and screening for cancer for at least 20 years after your treatment. If Pap tests have been discontinued for you, your risk factors (such as having a new sexual partner) need to be reassessed  to determine if you should start having screenings again. Some women have medical problems that increase the chance of getting cervical cancer. In these cases, your health care provider may recommend that you have screening and Pap tests more often.  If you have a family history of uterine cancer or ovarian cancer, talk with your health care provider about genetic screening.  If you have vaginal bleeding after reaching menopause, tell your health care provider.  There are currently no reliable tests available to screen for ovarian cancer. Lung Cancer Lung cancer screening is recommended for adults 57-50 years old who are at high risk for lung cancer because of a history of smoking. A yearly low-dose CT scan of the lungs is recommended if you:  Currently smoke.  Have a history of at least 30 pack-years of smoking and you currently smoke or have quit within the past 15 years. A pack-year is smoking an average of one pack of cigarettes per day for one year. Yearly screening should:  Continue until it has been 15 years since you quit.  Stop if you develop a health problem that would prevent you from having lung cancer treatment. Colorectal Cancer  This type of cancer can be detected and can often be prevented.  Routine colorectal cancer screening usually begins at age 12 and continues through  age 63.  If you have risk factors for colon cancer, your health care provider may recommend that you be screened at an earlier age.  If you have a family history of colorectal cancer, talk with your health care provider about genetic screening.  Your health care provider may also recommend using home test kits to check for hidden blood in your stool.  A small camera at the end of a tube can be used to examine your colon directly (sigmoidoscopy or colonoscopy). This is done to check for the earliest forms of colorectal cancer.  Direct examination of the colon should be repeated every 5-10 years until  age 75. However, if early forms of precancerous polyps or small growths are found or if you have a family history or genetic risk for colorectal cancer, you may need to be screened more often. Skin Cancer  Check your skin from head to toe regularly.  Monitor any moles. Be sure to tell your health care provider: ? About any new moles or changes in moles, especially if there is a change in a mole's shape or color. ? If you have a mole that is larger than the size of a pencil eraser.  If any of your family members has a history of skin cancer, especially at a young age, talk with your health care provider about genetic screening.  Always use sunscreen. Apply sunscreen liberally and repeatedly throughout the day.  Whenever you are outside, protect yourself by wearing long sleeves, pants, a wide-brimmed hat, and sunglasses. What should I know about osteoporosis? Osteoporosis is a condition in which bone destruction happens more quickly than new bone creation. After menopause, you may be at an increased risk for osteoporosis. To help prevent osteoporosis or the bone fractures that can happen because of osteoporosis, the following is recommended:  If you are 59-59 years old, get at least 1,000 mg of calcium and at least 600 mg of vitamin D per day.  If you are older than age 36 but younger than age 32, get at least 1,200 mg of calcium and at least 600 mg of vitamin D per day.  If you are older than age 47, get at least 1,200 mg of calcium and at least 800 mg of vitamin D per day. Smoking and excessive alcohol intake increase the risk of osteoporosis. Eat foods that are rich in calcium and vitamin D, and do weight-bearing exercises several times each week as directed by your health care provider. What should I know about how menopause affects my mental health? Depression may occur at any age, but it is more common as you become older. Common symptoms of depression include:  Low or sad mood.   Changes in sleep patterns.  Changes in appetite or eating patterns.  Feeling an overall lack of motivation or enjoyment of activities that you previously enjoyed.  Frequent crying spells. Talk with your health care provider if you think that you are experiencing depression. What should I know about immunizations? It is important that you get and maintain your immunizations. These include:  Tetanus, diphtheria, and pertussis (Tdap) booster vaccine.  Influenza every year before the flu season begins.  Pneumonia vaccine.  Shingles vaccine. Your health care provider may also recommend other immunizations. This information is not intended to replace advice given to you by your health care provider. Make sure you discuss any questions you have with your health care provider. Document Released: 07/18/2005 Document Revised: 12/14/2015 Document Reviewed: 02/27/2015 Elsevier Interactive Patient Education  2019 [place].

## 2018-11-19 LAB — CBC WITH DIFFERENTIAL/PLATELET
Basophils Absolute: 0.1 10*3/uL (ref 0.0–0.2)
Basos: 1 %
EOS (ABSOLUTE): 0.3 10*3/uL (ref 0.0–0.4)
Eos: 3 %
Hematocrit: 42.2 % (ref 34.0–46.6)
Hemoglobin: 14.3 g/dL (ref 11.1–15.9)
Immature Grans (Abs): 0.1 10*3/uL (ref 0.0–0.1)
Immature Granulocytes: 1 %
Lymphocytes Absolute: 3.5 10*3/uL — ABNORMAL HIGH (ref 0.7–3.1)
Lymphs: 30 %
MCH: 31.6 pg (ref 26.6–33.0)
MCHC: 33.9 g/dL (ref 31.5–35.7)
MCV: 93 fL (ref 79–97)
Monocytes Absolute: 0.4 10*3/uL (ref 0.1–0.9)
Monocytes: 4 %
Neutrophils Absolute: 7.1 10*3/uL — ABNORMAL HIGH (ref 1.4–7.0)
Neutrophils: 61 %
Platelets: 402 10*3/uL (ref 150–450)
RBC: 4.53 x10E6/uL (ref 3.77–5.28)
RDW: 13.1 % (ref 11.7–15.4)
WBC: 11.5 10*3/uL — ABNORMAL HIGH (ref 3.4–10.8)

## 2018-11-19 LAB — COMPREHENSIVE METABOLIC PANEL
ALT: 15 IU/L (ref 0–32)
AST: 21 IU/L (ref 0–40)
Albumin/Globulin Ratio: 1.6 (ref 1.2–2.2)
Albumin: 4.2 g/dL (ref 3.8–4.9)
Alkaline Phosphatase: 101 IU/L (ref 39–117)
BUN/Creatinine Ratio: 24 — ABNORMAL HIGH (ref 9–23)
BUN: 14 mg/dL (ref 6–24)
Bilirubin Total: 0.4 mg/dL (ref 0.0–1.2)
CO2: 22 mmol/L (ref 20–29)
Calcium: 9.3 mg/dL (ref 8.7–10.2)
Chloride: 99 mmol/L (ref 96–106)
Creatinine, Ser: 0.59 mg/dL (ref 0.57–1.00)
GFR calc Af Amer: 116 mL/min/{1.73_m2} (ref >59)
GFR calc non Af Amer: 101 mL/min/{1.73_m2} (ref >59)
Globulin, Total: 2.6 g/dL (ref 1.5–4.5)
Glucose: 120 mg/dL — ABNORMAL HIGH (ref 65–99)
Potassium: 4.7 mmol/L (ref 3.5–5.2)
Sodium: 139 mmol/L (ref 134–144)
Total Protein: 6.8 g/dL (ref 6.0–8.5)

## 2018-11-19 LAB — LIPID PANEL
Chol/HDL Ratio: 4.1 ratio (ref 0.0–4.4)
Cholesterol, Total: 200 mg/dL — ABNORMAL HIGH (ref 100–199)
HDL: 49 mg/dL (ref >39)
LDL Calculated: 114 mg/dL — ABNORMAL HIGH (ref 0–99)
Triglycerides: 183 mg/dL — ABNORMAL HIGH (ref 0–149)
VLDL Cholesterol Cal: 37 mg/dL (ref 5–40)

## 2018-11-19 LAB — HEMOGLOBIN A1C
Est. average glucose Bld gHb Est-mCnc: 131 mg/dL
Hgb A1c MFr Bld: 6.2 % — ABNORMAL HIGH (ref 4.8–5.6)

## 2018-11-19 LAB — TSH+FREE T4
Free T4: 1.4 ng/dL (ref 0.82–1.77)
TSH: 3.53 u[IU]/mL (ref 0.450–4.500)

## 2018-11-24 ENCOUNTER — Other Ambulatory Visit: Payer: Self-pay | Admitting: Internal Medicine

## 2018-11-24 DIAGNOSIS — I10 Essential (primary) hypertension: Secondary | ICD-10-CM

## 2018-11-25 ENCOUNTER — Ambulatory Visit
Admission: RE | Admit: 2018-11-25 | Discharge: 2018-11-25 | Disposition: A | Discharge status: Home / Self Care | Payer: No Typology Code available for payment source | Source: Ambulatory Visit | Attending: Internal Medicine | Admitting: Internal Medicine

## 2018-11-25 ENCOUNTER — Other Ambulatory Visit: Payer: Self-pay

## 2018-11-25 DIAGNOSIS — N950 Postmenopausal bleeding: Secondary | ICD-10-CM | POA: Diagnosis not present

## 2018-11-26 ENCOUNTER — Other Ambulatory Visit: Payer: Self-pay | Admitting: Internal Medicine

## 2018-11-26 DIAGNOSIS — N950 Postmenopausal bleeding: Secondary | ICD-10-CM

## 2018-12-07 ENCOUNTER — Other Ambulatory Visit: Payer: Self-pay | Admitting: Internal Medicine

## 2018-12-07 ENCOUNTER — Telehealth: Payer: Self-pay

## 2018-12-07 DIAGNOSIS — R928 Other abnormal and inconclusive findings on diagnostic imaging of breast: Secondary | ICD-10-CM

## 2018-12-07 NOTE — Telephone Encounter (Signed)
Patient called saying [facility] could not schedule her for screening mammo. She needs diagnostic mammo and [place] of breast. Ordered and informed patient.

## 2018-12-08 ENCOUNTER — Other Ambulatory Visit: Payer: Self-pay

## 2018-12-08 ENCOUNTER — Telehealth: Payer: Self-pay | Admitting: Obstetrics and Gynecology

## 2018-12-08 ENCOUNTER — Encounter: Payer: Self-pay | Admitting: Obstetrics and Gynecology

## 2018-12-08 ENCOUNTER — Ambulatory Visit (INDEPENDENT_AMBULATORY_CARE_PROVIDER_SITE_OTHER): Payer: No Typology Code available for payment source | Admitting: Obstetrics and Gynecology

## 2018-12-08 ENCOUNTER — Other Ambulatory Visit: Payer: Self-pay | Admitting: Internal Medicine

## 2018-12-08 VITALS — BP 140/80 | Ht 61.0 in | Wt 225.0 lb

## 2018-12-08 DIAGNOSIS — L409 Psoriasis, unspecified: Secondary | ICD-10-CM

## 2018-12-08 DIAGNOSIS — R9389 Abnormal findings on diagnostic imaging of other specified body structures: Secondary | ICD-10-CM | POA: Diagnosis not present

## 2018-12-08 DIAGNOSIS — N950 Postmenopausal bleeding: Secondary | ICD-10-CM

## 2018-12-08 DIAGNOSIS — Z1501 Genetic susceptibility to malignant neoplasm of breast: Secondary | ICD-10-CM

## 2018-12-08 DIAGNOSIS — Z1509 Genetic susceptibility to other malignant neoplasm: Secondary | ICD-10-CM

## 2018-12-08 HISTORY — DX: Genetic susceptibility to other malignant neoplasm: Z15.09

## 2018-12-08 HISTORY — DX: Genetic susceptibility to malignant neoplasm of breast: Z15.01

## 2018-12-08 NOTE — Telephone Encounter (Signed)
-----   Message from Homero Fellers, MD sent at 12/08/2018 12:09 PM EDT ----- Surgery Booking Request Patient Full Name:  [name]  MRN: [id number]  DOB: [birth year]  Surgeon: Homero Fellers, MD  Requested Surgery Date and Time: ASAP Primary Diagnosis AND Code: postmenopausal bleeding and vulvar psoriasis Secondary Diagnosis and Code:  Surgical Procedure: hysteroscopy D&C, possible myosure, vulvar biopsy L&D Notification: No Admission Status: same day surgery Length of Surgery: 1 hour Special Case Needs: myosure available H&P: today (date) Phone Interview???: yes Interpreter: Language:  Medical Clearance: no Special Scheduling Instructions: patient out of town the first week in August Acuity: P3

## 2018-12-08 NOTE — Telephone Encounter (Signed)
Lmtrc

## 2018-12-08 NOTE — H&P (View-Only) (Signed)
Patient ID: [name], female   DOB: 1960/07/03, 58 y.o.   MRN: [id number]  Reason for Consult: Referral (Post menopausal bleeding, x1 year, off and on)   Referred by Glean Hess, MD  Subjective:     HPI:  [name] is a 58 y.o. female  She is here today for a consultation regarding postmenopausal bleeding. She reports that for 1 year she has had occasional vaginal bleeding. Sometimes it is a little when she wipes. Sometimes it is enough to be seen on a pad. Se can not connect the bleeding events with any rhyme, reason or pattern. She reports that normally the bleeding is bright red blood. It is not brown. There re no large clots.  She sometimes has bleeding after intercourse.  She recently had a pelvic US which showed a thickened endometrium as well as a submucosal fibroid.   Gynecological History Menarche: 23 Menopause: 79 Reports that at the age of [age] her periods were irregular with about 3 periods occurring in a year. From 24 onward she would have 1 period a year always during tax season which is a stressful time for her at work. Her last period was in February of 2010  She reports that she has a history of heavy and painful periods. She has issues with anemia from her teenage years an in her 79s. She would sometimes miss school because of the pain. After she had children her periods were less painful and not as heavy.   Last pap smear: 03/02/2017- NIL HPV negative Last Mammogram: 2017, BIRADS 3 History of STDs: Trichomoniasis as a teenager Sexually Active: yes Denies hx of fibroids, however one seen on her recent ultrasound.  Denies hx of endometriosis Reports a distant hx of an abnormal pap smear, but no treatments were needed.   Obstetrical History 32 SVD  Female   9#3oz  60 wks 21  SVD  Female  9#7oz  40 wks  History of rectal tearing with both deliveries, issues with rectal incontinence following these deliveries.   Past Medical History:  Diagnosis  Date  . Allergic rhinitis   . Arthritis   . Asthma   . Basal cell carcinoma   . Bursitis   . Dermatitis, eczematoid 09/30/2011  . Duodenal ulcer   . Fibromyalgia   . Gastritis   . GERD (gastroesophageal reflux disease)   . Hiatal hernia   . Hypertension   . Hypothyroidism   . IBS (irritable bowel syndrome)   . OSA (obstructive sleep apnea)   . Psoriasis    Family History  Problem Relation Age of Onset  . Breast cancer Mother 73  . CAD Father   . Heart attack Father   . Bladder Cancer Father   . Breast cancer Paternal Grandmother   . Diabetes Other        multiple family members   Past Surgical History:  Procedure Laterality Date  . APPENDECTOMY    . BREAST BIOPSY Left 2007  . CHOLECYSTECTOMY    . SKIN CANCER EXCISION     BCCA of face  . TONSILECTOMY, ADENOIDECTOMY, BILATERAL MYRINGOTOMY AND TUBES      Short Social History:  Social History   Tobacco Use  . Smoking status: Never Smoker  . Smokeless tobacco: Never Used  Substance Use Topics  . Alcohol use: Yes    Alcohol/week: 2.0 standard drinks    Types: 2 Standard drinks or equivalent per week    Allergies  Allergen Reactions  .  Amlodipine Swelling    Leg swelling  . Furosemide Swelling  . Lyrica [Pregabalin] Other (See Comments)    Dizziness and blurred vision  . Sulfa Antibiotics Other (See Comments)    seizure  . Hydrochlorothiazide Itching and Rash    Current Outpatient Medications  Medication Sig Dispense Refill  . aspirin 81 MG EC tablet Take 1 tablet by mouth daily.    Marland Kitchen BYSTOLIC 10 MG tablet TAKE 1 TABLET BY MOUTH DAILY 90 tablet 1  . Cetirizine HCl 10 MG CAPS Take 1 capsule by mouth daily.    . cholestyramine light (PREVALITE) 4 g packet MIX 1 PACKET AS DIRECTED AND DRINK ONCE DAILY 90 packet 1  . cyclobenzaprine (FLEXERIL) 5 MG tablet TAKE 3 TABLETS BY MOUTH TWO TIMES DAILY 540 tablet 0  . doxazosin (CARDURA) 2 MG tablet TAKE 1 TABLET (2 MG TOTAL) BY MOUTH DAILY. 90 tablet 3  . doxepin  (SINEQUAN) 100 MG capsule TAKE 1 CAPSULE BY MOUTH ONCE DAILY AT BEDTIME 90 capsule 3  . esomeprazole (NEXIUM) 40 MG capsule TAKE 1 CAPSULE (40 MG TOTAL) BY MOUTH AT BEDTIME. 90 capsule 3  . levothyroxine (SYNTHROID) 200 MCG tablet TAKE 1 TABLET (200 MCG TOTAL) BY MOUTH DAILY. 90 tablet 3  . lisinopril (ZESTRIL) 40 MG tablet TAKE 1 TABLET BY MOUTH DAILY 90 tablet 0  . meloxicam (MOBIC) 15 MG tablet TAKE 1 TABLET (15 MG TOTAL) BY MOUTH DAILY. 90 tablet 3  . montelukast (SINGULAIR) 10 MG tablet TAKE 1 TABLET BY MOUTH NIGHTLY AT BEDTIME 90 tablet 3  . Multiple Vitamins-Iron (MULTIPLE VITAMIN/IRON PO) Take by mouth. Reported on 07/31/2015    . traZODone (DESYREL) 50 MG tablet Take 1-2 tablets (50-100 mg total) by mouth at bedtime as needed for sleep. 180 tablet 1  . vitamin B-12 (CYANOCOBALAMIN) 500 MCG tablet Take 500 mcg by mouth daily.    Marland Kitchen albuterol (PROVENTIL HFA;VENTOLIN HFA) 108 (90 BASE) MCG/ACT inhaler Inhale 2 puffs into the lungs 4 (four) times daily as needed. (Patient not taking: Reported on 12/08/2018) 18 g 12  . budesonide-formoterol (SYMBICORT) 160-4.5 MCG/ACT inhaler Inhale 1 puff into the lungs 2 (two) times daily. (Patient not taking: Reported on 11/18/2018) 3 Inhaler 3  . diphenhydrAMINE HCl (BENADRYL ALLERGY PO) Take by mouth.    . loperamide (IMODIUM A-D) 2 MG tablet Take 1 tablet by mouth 3 (three) times daily as needed.    . traMADol (ULTRAM) 50 MG tablet TAKE 1 TABLET BY MOUTH EVERY 12 HOURS AS NEEDED (Patient not taking: Reported on 12/08/2018) 60 tablet 1   No current facility-administered medications for this visit.     REVIEW OF SYSTEMS      Objective:  Objective   Vitals:   12/08/18 1102  BP: 140/80  Weight: 225 lb (102.1 kg)  Height: 5\' 1"  (1.549 m)   Body mass index is 42.51 kg/m.  Physical Exam Vitals signs and nursing note reviewed. Exam conducted with a chaperone present.  Constitutional:      Appearance: She is well-developed.  HENT:     Head:  Normocephalic and atraumatic.  Eyes:     Pupils: Pupils are equal, round, and reactive to light.  Cardiovascular:     Rate and Rhythm: Normal rate and regular rhythm.  Pulmonary:     Effort: Pulmonary effort is normal. No respiratory distress.  Genitourinary:    Vagina: Normal. No foreign body. No vaginal discharge or bleeding.     Cervix: Normal.     Uterus: Normal.  Not enlarged, not fixed and not tender.      Adnexa: Right adnexa normal and left adnexa normal.    Skin:    General: Skin is warm and dry.  Neurological:     Mental Status: She is alert and oriented to person, place, and time.  Psychiatric:        Behavior: Behavior normal.        Thought Content: Thought content normal.        Judgment: Judgment normal.        Assessment/Plan:     58 yo with thickened endometrium and postmenopausal bleeding.  Offered endometrial biopsy today in office but she declines. She is aware that there could be significant delays in scheduling an office procedure because of the current COVID limitations and that this could affect her disease outcome. She understands.  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  She would like to have her vulvar psoriasis biopsied to confirm the diagnosis at the same time.  Discussed surgical risks, consents signed for hysteroscopy dilation and curettage, possible myosure and vulvar biopsy.   More than 45 minutes were spent face to face with the patient in the room with more than 50% of the time spent providing counseling and discussing the plan of management.     Adrian Prows MD [facility] 12/08/2018 11:39 PM

## 2018-12-08 NOTE — Progress Notes (Signed)
Patient ID: [name], female   DOB: 06-Oct-1960, 58 y.o.   MRN: [id number]  Reason for Consult: Referral (Post menopausal bleeding, x1 year, off and on)   Referred by Glean Hess, MD  Subjective:     HPI:  [name] is a 58 y.o. female  She is here today for a consultation regarding postmenopausal bleeding. She reports that for 1 year she has had occasional vaginal bleeding. Sometimes it is a little when she wipes. Sometimes it is enough to be seen on a pad. Se can not connect the bleeding events with any rhyme, reason or pattern. She reports that normally the bleeding is bright red blood. It is not brown. There re no large clots.  She sometimes has bleeding after intercourse.  She recently had a pelvic US which showed a thickened endometrium as well as a submucosal fibroid.   Gynecological History Menarche: 2 Menopause: 70 Reports that at the age of 55-44 her periods were irregular with about 3 periods occurring in a year. From 60 onward she would have 1 period a year always during tax season which is a stressful time for her at work. Her last period was in February of 2010  She reports that she has a history of heavy and painful periods. She has issues with anemia from her teenage years an in her 32s. She would sometimes miss school because of the pain. After she had children her periods were less painful and not as heavy.   Last pap smear: 03/02/2017- NIL HPV negative Last Mammogram: 2017, BIRADS 3 History of STDs: Trichomoniasis as a teenager Sexually Active: yes Denies hx of fibroids, however one seen on her recent ultrasound.  Denies hx of endometriosis Reports a distant hx of an abnormal pap smear, but no treatments were needed.   Obstetrical History 86 SVD  Female   9#3oz  38 wks 41  SVD  Female  9#7oz  40 wks  History of rectal tearing with both deliveries, issues with rectal incontinence following these deliveries.   Past Medical History:  Diagnosis  Date  . Allergic rhinitis   . Arthritis   . Asthma   . Basal cell carcinoma   . Bursitis   . Dermatitis, eczematoid 09/30/2011  . Duodenal ulcer   . Fibromyalgia   . Gastritis   . GERD (gastroesophageal reflux disease)   . Hiatal hernia   . Hypertension   . Hypothyroidism   . IBS (irritable bowel syndrome)   . OSA (obstructive sleep apnea)   . Psoriasis    Family History  Problem Relation Age of Onset  . Breast cancer Mother 3  . CAD Father   . Heart attack Father   . Bladder Cancer Father   . Breast cancer Paternal Grandmother   . Diabetes Other        multiple family members   Past Surgical History:  Procedure Laterality Date  . APPENDECTOMY    . BREAST BIOPSY Left 2007  . CHOLECYSTECTOMY    . SKIN CANCER EXCISION     BCCA of face  . TONSILECTOMY, ADENOIDECTOMY, BILATERAL MYRINGOTOMY AND TUBES      Short Social History:  Social History   Tobacco Use  . Smoking status: Never Smoker  . Smokeless tobacco: Never Used  Substance Use Topics  . Alcohol use: Yes    Alcohol/week: 2.0 standard drinks    Types: 2 Standard drinks or equivalent per week    Allergies  Allergen Reactions  .  Amlodipine Swelling    Leg swelling  . Furosemide Swelling  . Lyrica [Pregabalin] Other (See Comments)    Dizziness and blurred vision  . Sulfa Antibiotics Other (See Comments)    seizure  . Hydrochlorothiazide Itching and Rash    Current Outpatient Medications  Medication Sig Dispense Refill  . aspirin 81 MG EC tablet Take 1 tablet by mouth daily.    Marland Kitchen BYSTOLIC 10 MG tablet TAKE 1 TABLET BY MOUTH DAILY 90 tablet 1  . Cetirizine HCl 10 MG CAPS Take 1 capsule by mouth daily.    . cholestyramine light (PREVALITE) 4 g packet MIX 1 PACKET AS DIRECTED AND DRINK ONCE DAILY 90 packet 1  . cyclobenzaprine (FLEXERIL) 5 MG tablet TAKE 3 TABLETS BY MOUTH TWO TIMES DAILY 540 tablet 0  . doxazosin (CARDURA) 2 MG tablet TAKE 1 TABLET (2 MG TOTAL) BY MOUTH DAILY. 90 tablet 3  . doxepin  (SINEQUAN) 100 MG capsule TAKE 1 CAPSULE BY MOUTH ONCE DAILY AT BEDTIME 90 capsule 3  . esomeprazole (NEXIUM) 40 MG capsule TAKE 1 CAPSULE (40 MG TOTAL) BY MOUTH AT BEDTIME. 90 capsule 3  . levothyroxine (SYNTHROID) 200 MCG tablet TAKE 1 TABLET (200 MCG TOTAL) BY MOUTH DAILY. 90 tablet 3  . lisinopril (ZESTRIL) 40 MG tablet TAKE 1 TABLET BY MOUTH DAILY 90 tablet 0  . meloxicam (MOBIC) 15 MG tablet TAKE 1 TABLET (15 MG TOTAL) BY MOUTH DAILY. 90 tablet 3  . montelukast (SINGULAIR) 10 MG tablet TAKE 1 TABLET BY MOUTH NIGHTLY AT BEDTIME 90 tablet 3  . Multiple Vitamins-Iron (MULTIPLE VITAMIN/IRON PO) Take by mouth. Reported on 07/31/2015    . traZODone (DESYREL) 50 MG tablet Take 1-2 tablets (50-100 mg total) by mouth at bedtime as needed for sleep. 180 tablet 1  . vitamin B-12 (CYANOCOBALAMIN) 500 MCG tablet Take 500 mcg by mouth daily.    Marland Kitchen albuterol (PROVENTIL HFA;VENTOLIN HFA) 108 (90 BASE) MCG/ACT inhaler Inhale 2 puffs into the lungs 4 (four) times daily as needed. (Patient not taking: Reported on 12/08/2018) 18 g 12  . budesonide-formoterol (SYMBICORT) 160-4.5 MCG/ACT inhaler Inhale 1 puff into the lungs 2 (two) times daily. (Patient not taking: Reported on 11/18/2018) 3 Inhaler 3  . diphenhydrAMINE HCl (BENADRYL ALLERGY PO) Take by mouth.    . loperamide (IMODIUM A-D) 2 MG tablet Take 1 tablet by mouth 3 (three) times daily as needed.    . traMADol (ULTRAM) 50 MG tablet TAKE 1 TABLET BY MOUTH EVERY 12 HOURS AS NEEDED (Patient not taking: Reported on 12/08/2018) 60 tablet 1   No current facility-administered medications for this visit.     REVIEW OF SYSTEMS      Objective:  Objective   Vitals:   12/08/18 1102  BP: 140/80  Weight: 225 lb (102.1 kg)  Height: 5\' 1"  (1.549 m)   Body mass index is 42.51 kg/m.  Physical Exam Vitals signs and nursing note reviewed. Exam conducted with a chaperone present.  Constitutional:      Appearance: She is well-developed.  HENT:     Head:  Normocephalic and atraumatic.  Eyes:     Pupils: Pupils are equal, round, and reactive to light.  Cardiovascular:     Rate and Rhythm: Normal rate and regular rhythm.  Pulmonary:     Effort: Pulmonary effort is normal. No respiratory distress.  Genitourinary:    Vagina: Normal. No foreign body. No vaginal discharge or bleeding.     Cervix: Normal.     Uterus: Normal.  Not enlarged, not fixed and not tender.      Adnexa: Right adnexa normal and left adnexa normal.    Skin:    General: Skin is warm and dry.  Neurological:     Mental Status: She is alert and oriented to person, place, and time.  Psychiatric:        Behavior: Behavior normal.        Thought Content: Thought content normal.        Judgment: Judgment normal.        Assessment/Plan:     58 yo with thickened endometrium and postmenopausal bleeding.  Offered endometrial biopsy today in office but she declines. She is aware that there could be significant delays in scheduling an office procedure because of the current COVID limitations and that this could affect her disease outcome. She understands.  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  She would like to have her vulvar psoriasis biopsied to confirm the diagnosis at the same time.  Discussed surgical risks, consents signed for hysteroscopy dilation and curettage, possible myosure and vulvar biopsy.   More than 45 minutes were spent face to face with the patient in the room with more than 50% of the time spent providing counseling and discussing the plan of management.     Adrian Prows MD [facility] 12/08/2018 11:39 PM

## 2018-12-09 ENCOUNTER — Ambulatory Visit: Payer: No Typology Code available for payment source

## 2018-12-09 ENCOUNTER — Other Ambulatory Visit: Payer: Self-pay | Admitting: Obstetrics and Gynecology

## 2018-12-09 ENCOUNTER — Encounter: Payer: Self-pay | Admitting: Obstetrics and Gynecology

## 2018-12-09 NOTE — Telephone Encounter (Signed)
Patient is aware of Pre-admit Testing phone interview to be scheduled, and COVID testing tomorrow morning @ [address] 8-10:30am, and OR on 12/14/18. Patient is aware she will be asked to quarantine after COVID testing. Patient is aware she may receive calls from the [place] and [facility]. Patient confirmed [facility], and no secondary insurance. Patient is aware of no visitors at [facility]. Patient would like to know recovery time.

## 2018-12-09 NOTE — Telephone Encounter (Signed)
Patient is aware of 1 week recovery time.

## 2018-12-10 ENCOUNTER — Other Ambulatory Visit: Payer: Self-pay

## 2018-12-10 ENCOUNTER — Encounter
Admission: RE | Admit: 2018-12-10 | Discharge: 2018-12-10 | Disposition: A | Discharge status: Home / Self Care | Payer: No Typology Code available for payment source | Source: Ambulatory Visit | Attending: Obstetrics and Gynecology | Admitting: Obstetrics and Gynecology

## 2018-12-10 DIAGNOSIS — I10 Essential (primary) hypertension: Secondary | ICD-10-CM | POA: Insufficient documentation

## 2018-12-10 DIAGNOSIS — Z01818 Encounter for other preprocedural examination: Secondary | ICD-10-CM | POA: Insufficient documentation

## 2018-12-10 DIAGNOSIS — Z1159 Encounter for screening for other viral diseases: Secondary | ICD-10-CM | POA: Insufficient documentation

## 2018-12-10 HISTORY — DX: Rheumatic fever without heart involvement: I00

## 2018-12-10 HISTORY — DX: Other complications of anesthesia, initial encounter: T88.59XA

## 2018-12-10 HISTORY — DX: Cardiac murmur, unspecified: R01.1

## 2018-12-10 NOTE — Patient Instructions (Signed)
Your procedure is scheduled on: Tues 7/7 Report to Day Surgery. To find out your arrival time please call [phone] between 1PM - 3PM on Mon 7/3.  Remember: Instructions that are not followed completely may result in serious medical risk,  up to and including death, or upon the discretion of your surgeon and anesthesiologist your  surgery may need to be rescheduled.     _X__ 1. Do not eat food after midnight the night before your procedure.                 No gum chewing or hard candies. You may drink clear liquids up to 2 hours                 before you are scheduled to arrive for your surgery- DO not drink clear                 liquids within 2 hours of the start of your surgery.                 Clear Liquids include:  water, apple juice without pulp, clear carbohydrate                 drink such as Clearfast of Gatorade, Black Coffee or Tea (Do not add                 anything to coffee or tea).  __X__2.  On the morning of surgery brush your teeth with toothpaste and water, you                may rinse your mouth with mouthwash if you wish.  Do not swallow any toothpaste of mouthwash.     _X__ 3.  No Alcohol for 24 hours before or after surgery.   _X__ 4.  Do Not Smoke or use e-cigarettes For 24 Hours Prior to Your Surgery.                 Do not use any chewable tobacco products for at least 6 hours prior to                 surgery.  ____  5.  Bring all medications with you on the day of surgery if instructed.   __x__  6.  Notify your doctor if there is any change in your medical condition      (cold, fever, infections).     Do not wear jewelry, make-up, hairpins, clips or nail polish. Do not wear lotions, powders, or perfumes. You may wear deodorant. Do not shave 48 hours prior to surgery. Men may shave face and neck. Do not bring valuables to the hospital.    [facility] is not responsible for any belongings or valuables.  Contacts, dentures or  bridgework may not be worn into surgery. Leave your suitcase in the car. After surgery it may be brought to your room. For patients admitted to the hospital, discharge time is determined by your treatment team.   Patients discharged the day of surgery will not be allowed to drive home.   Please read over the following fact sheets that you were given:    __x__ Take these medicines the morning of surgery with A SIP OF WATER:    1. cetirizine (ZYRTEC) 10 MG tablet  2. cyclobenzaprine (FLEXERIL) 5 MG tablet  3. esomeprazole (NEXIUM) 40 MG capsule  4.levothyroxine (SYNTHROID) 200 MCG tablet  5.traMADol (ULTRAM) 50 MG tablet  If needed  6.  ____ Fleet Enema (as directed)   ____ Use CHG Soap as directed  _x___ Use inhalers on the day of surgery albuterol (PROVENTIL HFA;VENTOLIN HFA) 108 (90 BASE) MCG/ACT inhaler and bring with you  ____ Stop metformin 2 days prior to surgery    ____ Take 1/2 of usual insulin dose the night before surgery. No insulin the morning          of surgery.   __x__ Stop aspirin today  _x___ Stop Anti-inflammatories meloxicam (MOBIC) 15 MG tablet today    ____ Stop supplements until after surgery.    ____ Bring C-Pap to the hospital.

## 2018-12-11 LAB — SARS CORONAVIRUS 2 (TAT 6-24 HRS): SARS Coronavirus 2: NEGATIVE

## 2018-12-13 ENCOUNTER — Other Ambulatory Visit: Payer: Self-pay | Admitting: Internal Medicine

## 2018-12-13 DIAGNOSIS — Z1231 Encounter for screening mammogram for malignant neoplasm of breast: Secondary | ICD-10-CM

## 2018-12-13 DIAGNOSIS — R928 Other abnormal and inconclusive findings on diagnostic imaging of breast: Secondary | ICD-10-CM

## 2018-12-14 ENCOUNTER — Encounter
Admission: RE | Disposition: A | Discharge status: Home / Self Care | Payer: Self-pay | Source: Home / Self Care | Attending: Obstetrics and Gynecology

## 2018-12-14 ENCOUNTER — Ambulatory Visit: Payer: No Typology Code available for payment source | Admitting: Anesthesiology

## 2018-12-14 ENCOUNTER — Encounter: Payer: Self-pay | Admitting: *Deleted

## 2018-12-14 ENCOUNTER — Ambulatory Visit
Admission: RE | Admit: 2018-12-14 | Discharge: 2018-12-14 | Disposition: A | Discharge status: Home / Self Care | Payer: No Typology Code available for payment source | Attending: Obstetrics and Gynecology | Admitting: Obstetrics and Gynecology

## 2018-12-14 ENCOUNTER — Other Ambulatory Visit: Payer: Self-pay

## 2018-12-14 DIAGNOSIS — Z85828 Personal history of other malignant neoplasm of skin: Secondary | ICD-10-CM | POA: Insufficient documentation

## 2018-12-14 DIAGNOSIS — I10 Essential (primary) hypertension: Secondary | ICD-10-CM | POA: Insufficient documentation

## 2018-12-14 DIAGNOSIS — K219 Gastro-esophageal reflux disease without esophagitis: Secondary | ICD-10-CM | POA: Insufficient documentation

## 2018-12-14 DIAGNOSIS — Z791 Long term (current) use of non-steroidal anti-inflammatories (NSAID): Secondary | ICD-10-CM | POA: Insufficient documentation

## 2018-12-14 DIAGNOSIS — R9389 Abnormal findings on diagnostic imaging of other specified body structures: Secondary | ICD-10-CM

## 2018-12-14 DIAGNOSIS — N950 Postmenopausal bleeding: Secondary | ICD-10-CM | POA: Insufficient documentation

## 2018-12-14 DIAGNOSIS — Z79890 Hormone replacement therapy (postmenopausal): Secondary | ICD-10-CM | POA: Diagnosis not present

## 2018-12-14 DIAGNOSIS — J45909 Unspecified asthma, uncomplicated: Secondary | ICD-10-CM | POA: Insufficient documentation

## 2018-12-14 DIAGNOSIS — Z79899 Other long term (current) drug therapy: Secondary | ICD-10-CM | POA: Diagnosis not present

## 2018-12-14 DIAGNOSIS — D250 Submucous leiomyoma of uterus: Secondary | ICD-10-CM | POA: Diagnosis not present

## 2018-12-14 DIAGNOSIS — Z6841 Body Mass Index (BMI) 40.0 and over, adult: Secondary | ICD-10-CM | POA: Insufficient documentation

## 2018-12-14 DIAGNOSIS — G4733 Obstructive sleep apnea (adult) (pediatric): Secondary | ICD-10-CM | POA: Diagnosis not present

## 2018-12-14 DIAGNOSIS — Z7982 Long term (current) use of aspirin: Secondary | ICD-10-CM | POA: Insufficient documentation

## 2018-12-14 DIAGNOSIS — E039 Hypothyroidism, unspecified: Secondary | ICD-10-CM | POA: Insufficient documentation

## 2018-12-14 DIAGNOSIS — L280 Lichen simplex chronicus: Secondary | ICD-10-CM | POA: Diagnosis not present

## 2018-12-14 HISTORY — PX: HYSTEROSCOPY WITH D & C: SHX1775

## 2018-12-14 LAB — CBC
HCT: 39.5 % (ref 36.0–46.0)
HCT: 44.6 % (ref 36.0–46.0)
Hemoglobin: 13.1 g/dL (ref 12.0–15.0)
Hemoglobin: 14.5 g/dL (ref 12.0–15.0)
MCH: 31.6 pg (ref 26.0–34.0)
MCH: 31.6 pg (ref 26.0–34.0)
MCHC: 33.2 g/dL (ref 30.0–36.0)
MCHC: 32.5 g/dL (ref 30.0–36.0)
MCV: 95.4 fL (ref 80.0–100.0)
MCV: 97.2 fL (ref 80.0–100.0)
Platelets: 335 10*3/uL (ref 150–400)
Platelets: 344 10*3/uL (ref 150–400)
RBC: 4.14 MIL/uL (ref 3.87–5.11)
RBC: 4.59 MIL/uL (ref 3.87–5.11)
RDW: 13.2 % (ref 11.5–15.5)
RDW: 13.1 % (ref 11.5–15.5)
WBC: 13.3 10*3/uL — ABNORMAL HIGH (ref 4.0–10.5)
WBC: 12.5 10*3/uL — ABNORMAL HIGH (ref 4.0–10.5)
nRBC: 0 % (ref 0.0–0.2)
nRBC: 0 % (ref 0.0–0.2)

## 2018-12-14 SURGERY — DILATATION AND CURETTAGE /HYSTEROSCOPY
Anesthesia: General

## 2018-12-14 MED ORDER — METHYLERGONOVINE MALEATE 0.2 MG/ML IJ SOLN
INTRAMUSCULAR | Status: AC
  Filled 2018-12-14: qty 1

## 2018-12-14 MED ORDER — LACTATED RINGERS IV SOLN
INTRAVENOUS | Status: DC | PRN
  Administered 2018-12-14: 11:00:00 via INTRAVENOUS

## 2018-12-14 MED ORDER — LACTATED RINGERS IV SOLN: INTRAVENOUS | Status: DC

## 2018-12-14 MED ORDER — LIDOCAINE HCL (CARDIAC) PF 100 MG/5ML IV SOSY
PREFILLED_SYRINGE | INTRAVENOUS | Status: DC | PRN
  Administered 2018-12-14: 100 mg via INTRAVENOUS

## 2018-12-14 MED ORDER — PHENYLEPHRINE HCL (PRESSORS) 10 MG/ML IV SOLN
INTRAVENOUS | Status: DC | PRN
  Administered 2018-12-14: 100 ug via INTRAVENOUS

## 2018-12-14 MED ORDER — ONDANSETRON HCL 4 MG/2ML IJ SOLN
INTRAMUSCULAR | Status: DC | PRN
  Administered 2018-12-14: 4 mg via INTRAVENOUS

## 2018-12-14 MED ORDER — SUCCINYLCHOLINE CHLORIDE 20 MG/ML IJ SOLN
INTRAMUSCULAR | Status: DC | PRN
  Administered 2018-12-14: 140 mg via INTRAVENOUS

## 2018-12-14 MED ORDER — MIDAZOLAM HCL 2 MG/2ML IJ SOLN
INTRAMUSCULAR | Status: AC
  Filled 2018-12-14: qty 2

## 2018-12-14 MED ORDER — ROCURONIUM BROMIDE 100 MG/10ML IV SOLN
INTRAVENOUS | Status: DC | PRN
  Administered 2018-12-14: 40 mg via INTRAVENOUS
  Administered 2018-12-14: 10 mg via INTRAVENOUS

## 2018-12-14 MED ORDER — PROPOFOL 10 MG/ML IV BOLUS
INTRAVENOUS | Status: DC | PRN
  Administered 2018-12-14: 200 mg via INTRAVENOUS

## 2018-12-14 MED ORDER — IBUPROFEN 800 MG PO TABS: 800.0000 mg | ORAL_TABLET | Freq: Three times a day (TID) | ORAL | 1 refills | Status: DC | PRN

## 2018-12-14 MED ORDER — ONDANSETRON HCL 4 MG/2ML IJ SOLN: 4.0000 mg | Freq: Once | INTRAMUSCULAR | Status: DC | PRN

## 2018-12-14 MED ORDER — DEXAMETHASONE SODIUM PHOSPHATE 10 MG/ML IJ SOLN
INTRAMUSCULAR | Status: DC | PRN
  Administered 2018-12-14: 10 mg via INTRAVENOUS

## 2018-12-14 MED ORDER — FENTANYL CITRATE (PF) 100 MCG/2ML IJ SOLN
INTRAMUSCULAR | Status: DC | PRN
  Administered 2018-12-14 (×2): 50 ug via INTRAVENOUS

## 2018-12-14 MED ORDER — FENTANYL CITRATE (PF) 100 MCG/2ML IJ SOLN
INTRAMUSCULAR | Status: AC
  Filled 2018-12-14: qty 2

## 2018-12-14 MED ORDER — ACETAMINOPHEN 500 MG PO TABS: 1000.0000 mg | ORAL_TABLET | Freq: Four times a day (QID) | ORAL | 1 refills | Status: DC | PRN

## 2018-12-14 MED ORDER — DEXMEDETOMIDINE HCL 200 MCG/2ML IV SOLN
INTRAVENOUS | Status: DC | PRN
  Administered 2018-12-14: 8 ug via INTRAVENOUS
  Administered 2018-12-14: 12 ug via INTRAVENOUS

## 2018-12-14 MED ORDER — IBUPROFEN 800 MG PO TABS
ORAL_TABLET | ORAL | Status: AC
  Filled 2018-12-14: qty 1

## 2018-12-14 MED ORDER — TRANEXAMIC ACID 650 MG PO TABS
1300.0000 mg | ORAL_TABLET | Freq: Three times a day (TID) | ORAL | Status: AC
  Administered 2018-12-14: 1300 mg via ORAL
  Filled 2018-12-14: qty 2

## 2018-12-14 MED ORDER — MIDAZOLAM HCL 2 MG/2ML IJ SOLN
INTRAMUSCULAR | Status: DC | PRN
  Administered 2018-12-14: 2 mg via INTRAVENOUS

## 2018-12-14 MED ORDER — FENTANYL CITRATE (PF) 100 MCG/2ML IJ SOLN: 25.0000 ug | INTRAMUSCULAR | Status: DC | PRN

## 2018-12-14 MED ORDER — METHYLERGONOVINE MALEATE 0.2 MG PO TABS
0.2000 mg | ORAL_TABLET | Freq: Once | ORAL | Status: AC
  Administered 2018-12-14: 0.2 mg via ORAL
  Filled 2018-12-14: qty 1

## 2018-12-14 MED ORDER — SILVER NITRATE-POT NITRATE 75-25 % EX MISC
CUTANEOUS | Status: DC | PRN
  Administered 2018-12-14: 4

## 2018-12-14 MED ORDER — IBUPROFEN 800 MG PO TABS
800.0000 mg | ORAL_TABLET | Freq: Once | ORAL | Status: AC
  Administered 2018-12-14: 800 mg via ORAL
  Filled 2018-12-14: qty 1

## 2018-12-14 MED ORDER — SUGAMMADEX SODIUM 200 MG/2ML IV SOLN
INTRAVENOUS | Status: AC
  Filled 2018-12-14: qty 2

## 2018-12-14 MED ORDER — SUGAMMADEX SODIUM 200 MG/2ML IV SOLN
INTRAVENOUS | Status: DC | PRN
  Administered 2018-12-14: 200 mg via INTRAVENOUS

## 2018-12-14 SURGICAL SUPPLY — 20 items
CATH ROBINSON RED A/P 16FR (CATHETERS) ×3 IMPLANT
DEVICE MYOSURE LITE (MISCELLANEOUS) IMPLANT
DEVICE MYOSURE REACH (MISCELLANEOUS) ×2 IMPLANT
ELECT REM PT RETURN 9FT ADLT (ELECTROSURGICAL)
ELECTRODE REM PT RTRN 9FT ADLT (ELECTROSURGICAL) IMPLANT
GLOVE BIOGEL PI IND STRL 6.5 (GLOVE) ×2 IMPLANT
GLOVE BIOGEL PI INDICATOR 6.5 (GLOVE) ×4
GLOVE SURG SYN 6.5 ES PF (GLOVE) ×3 IMPLANT
GLOVE SURG SYN 6.5 PF PI (GLOVE) ×1 IMPLANT
GOWN STRL REUS W/ TWL LRG LVL3 (GOWN DISPOSABLE) ×2 IMPLANT
GOWN STRL REUS W/TWL LRG LVL3 (GOWN DISPOSABLE) ×4
KIT PROCEDURE FLUENT (KITS) ×2 IMPLANT
PACK DNC HYST (MISCELLANEOUS) ×3 IMPLANT
PAD OB MATERNITY 4.3X12.25 (PERSONAL CARE ITEMS) ×3 IMPLANT
PAD PREP 24X41 OB/GYN DISP (PERSONAL CARE ITEMS) ×3 IMPLANT
PUNCH BIOPSY 3 (MISCELLANEOUS) ×2 IMPLANT
SEAL ROD LENS SCOPE MYOSURE (ABLATOR) ×3 IMPLANT
SOL .9 NS 3000ML IRR  AL (IV SOLUTION) ×2
SOL .9 NS 3000ML IRR UROMATIC (IV SOLUTION) ×1 IMPLANT
TOWEL OR 17X26 4PK STRL BLUE (TOWEL DISPOSABLE) ×3 IMPLANT

## 2018-12-14 NOTE — Transfer of Care (Signed)
Immediate Anesthesia Transfer of Care Note  Patient: [name]  Procedure(s) Performed: DILATATION AND CURETTAGE /HYSTEROSCOPY WITH MYOSURE, VULVAR BIOPSY (N/A )  Patient Location: PACU  Anesthesia Type:General  Level of Consciousness: awake  Airway & Oxygen Therapy: Patient Spontanous Breathing and Patient connected to face mask oxygen  Post-op Assessment: Report given to RN and Post -op Vital signs reviewed and stable  Post vital signs: Reviewed  Last Vitals:  Vitals Value Taken Time  BP 153/71 12/14/18 1201  Temp 36.9 C 12/14/18 1200  Pulse 79 12/14/18 1201  Resp 9 12/14/18 1201  SpO2 99 % 12/14/18 1201  Vitals shown include unvalidated device data.  Last Pain:  Vitals:   12/14/18 0756  TempSrc: Temporal  PainSc: 4          Complications: No apparent anesthesia complications

## 2018-12-14 NOTE — Progress Notes (Signed)
Pt soaked thru another peripad so I called Dr. Gilman Schmidt and she wants to give Methergine 0.2 tablet and watch the patient for 1 hr. I will continue to monitor the pt.

## 2018-12-14 NOTE — Interval H&P Note (Signed)
History and Physical Interval Note:  12/14/2018 10:28 AM  [name]  has presented today for surgery, with the diagnosis of POSTMENOPAUSAL BLEEDING AND VULVAR PSORIASIS.  The various methods of treatment have been discussed with the patient and family. After consideration of risks, benefits and other options for treatment, the patient has consented to  Procedure(s): DILATATION AND CURETTAGE /HYSTEROSCOPY WITH POSSIBLE MYOSURE, VULVAR BIOPSY (N/A) as a surgical intervention.  The patient's history has been reviewed, patient examined, no change in status, stable for surgery.  I have reviewed the patient's chart and labs.  Questions were answered to the patient's satisfaction.     [place]

## 2018-12-14 NOTE — Progress Notes (Signed)
Pt soaked thru one pad when she had to urinate. I will check her again prior to d/c. Blood loss could be due to biopsy.

## 2018-12-14 NOTE — Anesthesia Procedure Notes (Signed)
Procedure Name: Intubation Date/Time: 12/14/2018 10:56 AM Performed by: Justus Memory, CRNA Pre-anesthesia Checklist: Patient identified, Patient being monitored, Timeout performed, Emergency Drugs available and Suction available Patient Re-evaluated:Patient Re-evaluated prior to induction Oxygen Delivery Method: Circle system utilized Preoxygenation: Pre-oxygenation with 100% oxygen Induction Type: IV induction Ventilation: Mask ventilation without difficulty Laryngoscope Size: Mac, 3 and McGraph Grade View: Grade I Tube type: Oral Tube size: 7.0 mm Number of attempts: 1 Airway Equipment and Method: Stylet,  Video-laryngoscopy and Rigid stylet Placement Confirmation: ETT inserted through vocal cords under direct vision,  positive ETCO2 and breath sounds checked- equal and bilateral Secured at: 21 cm Tube secured with: Tape Dental Injury: Teeth and Oropharynx as per pre-operative assessment

## 2018-12-14 NOTE — Anesthesia Preprocedure Evaluation (Addendum)
Anesthesia Evaluation  Patient identified by MRN, date of birth, ID band Patient awake    Reviewed: Allergy & Precautions, NPO status , Patient's Chart, lab work & pertinent test results, reviewed documented beta blocker date and time   History of Anesthesia Complications (+) Emergence Delirium and history of anesthetic complications  Airway Mallampati: III  TM Distance: <3 FB     Dental  (+) Caps, Chipped   Pulmonary asthma , sleep apnea and Continuous Positive Airway Pressure Ventilation ,    Pulmonary exam normal        Cardiovascular hypertension, Pt. on medications and Pt. on home beta blockers Normal cardiovascular exam+ Valvular Problems/Murmurs      Neuro/Psych negative neurological ROS  negative psych ROS   GI/Hepatic Neg liver ROS, hiatal hernia, PUD, GERD  Medicated,  Endo/Other  Hypothyroidism Morbid obesity  Renal/GU negative Renal ROS  Female GU complaint     Musculoskeletal  (+) Arthritis , Osteoarthritis,  Fibromyalgia -  Abdominal Normal abdominal exam  (+)   Peds negative pediatric ROS (+)  Hematology negative hematology ROS (+)   Anesthesia Other Findings   Reproductive/Obstetrics                            Anesthesia Physical Anesthesia Plan  ASA: III  Anesthesia Plan: General   Post-op Pain Management:    Induction: Intravenous, Rapid sequence and Cricoid pressure planned  PONV Risk Score and Plan:   Airway Management Planned: Oral ETT  Additional Equipment:   Intra-op Plan:   Post-operative Plan: Extubation in OR  Informed Consent: I have reviewed the patients History and Physical, chart, labs and discussed the procedure including the risks, benefits and alternatives for the proposed anesthesia with the patient or authorized representative who has indicated his/her understanding and acceptance.     Dental advisory given  Plan Discussed with: CRNA  and Surgeon  Anesthesia Plan Comments: (Patient is a Jehovah witness and is refusing any  blood products, but will take non blood volume expanders.  Patient understands risks. )       Anesthesia Quick Evaluation

## 2018-12-14 NOTE — Progress Notes (Signed)
Informed Dr. Gilman Schmidt pt soaked another peripad so she stated to give her TXA and she will order a CBC. I will continue to monitor pt.

## 2018-12-14 NOTE — Op Note (Signed)
Operative Note  12/14/2018  PRE-OP DIAGNOSIS: Postmenopausal bleeding, thickened endometrium, submucosal fibroid, vulvar psoriasis  POST-OP DIAGNOSIS: same   SURGEON: Deedee Lybarger MD   PROCEDURE: Procedure(s): DILATATION AND CURETTAGE /HYSTEROSCOPY WITH MYOSURE, VULVAR BIOPSY   ANESTHESIA: Choice   ESTIMATED BLOOD LOSS: 5 cc   SPECIMENS:  Endometrial curetting,  Submucosal fibroid, vulvar biopsy  FLUID DEFICIT: 20 cc  COMPLICATIONS: None  DISPOSITION: PACU - hemodynamically stable.  CONDITION: stable  FINDINGS: Exam under anesthesia revealed small, mobile 1 uterus with no masses and bilateral adnexa without masses or fullness. Hysteroscopy revealed a  grossly normal appearing uterine cavity with bilateral tubal ostia and normal appearing endocervical canal. Small fundal submucosal fibroid.  PROCEDURE IN DETAIL: After informed consent was obtained, the patient was taken to the operating room where anesthesia was obtained without difficulty. The patient was positioned in the dorsal lithotomy position in [place]. The patient's bladder was catheterized with an in and out foley catheter. The patient was examined under anesthesia, with the above noted findings. The weightedspeculum was placed inside the patient's vagina, and the the anterior lip of the cervix was seen and grasped with the tenaculum.  The uterine cavity was sounded to 10cm, and then the cervix was progressively dilated to a 15French-Pratt dilator. The 30 degree hysteroscope was introduced, with saline fluid used to distend the intrauterine cavity, with the above noted findings.  The hystersocope was removed and the uterine cavity was curetted until a gritty texture was noted, yielding endometrial curettings. Scant curetting. The hysteroscope was inserted and the Myosure was used to remove a small submucosal fibroid and sample the endometrium in 360 degrees. Excellent hemostasis was noted, and all instruments were  removed, with excellent hemostasis noted throughout. She was then taken out of dorsal lithotomy. Minimal discrepancy in fluid was noted.  The patient tolerated the procedure well. Sponge, lap and needle counts were correct x2. The patient was taken to recovery room in excellent condition.  Adrian Prows MD [facility] 12/14/2018 12:15 PM

## 2018-12-14 NOTE — Progress Notes (Signed)
Pt's bleeding is under control. The peripad is light pink and not very much. I let Dr. Gilman Schmidt know and she stated she can go home.

## 2018-12-14 NOTE — Anesthesia Post-op Follow-up Note (Signed)
Anesthesia QCDR form completed.        

## 2018-12-14 NOTE — Anesthesia Postprocedure Evaluation (Signed)
Anesthesia Post Note  Patient: [name]  Procedure(s) Performed: DILATATION AND CURETTAGE /HYSTEROSCOPY WITH MYOSURE, VULVAR BIOPSY (N/A )  Patient location during evaluation: PACU Anesthesia Type: General Level of consciousness: awake and alert and oriented Pain management: pain level controlled Vital Signs Assessment: post-procedure vital signs reviewed and stable Respiratory status: spontaneous breathing Cardiovascular status: blood pressure returned to baseline Anesthetic complications: no     Last Vitals:  Vitals:   12/14/18 1241 12/14/18 1258  BP: (!) 114/57 (!) 119/58  Pulse: 77 72  Resp: 12 16  Temp: 36.9 C (!) 36.3 C  SpO2: 95% 96%    Last Pain:  Vitals:   12/14/18 1258  TempSrc: Temporal  PainSc: 5                  Lamonta Cypress

## 2018-12-15 ENCOUNTER — Encounter: Payer: Self-pay | Admitting: Obstetrics and Gynecology

## 2018-12-15 NOTE — Discharge Instructions (Signed)
Dilation and Curettage or Vacuum Curettage, Care After These instructions give you information about caring for yourself after your procedure. Your doctor may also give you more specific instructions. Call your doctor if you have any problems or questions after your procedure. Follow these instructions at home: Activity  Do not drive or use heavy machinery while taking prescription pain medicine.  For 24 hours after your procedure, avoid driving.  Take short walks often, followed by rest periods. Ask your doctor what activities are safe for you. After one or two days, you may be able to return to your normal activities.  Do not lift anything that is heavier than 10 lb (4.5 kg) until your doctor approves.  For at least 2 weeks, or as long as told by your doctor: ? Do not douche. ? Do not use tampons. ? Do not have sex. General instructions   Take over-the-counter and prescription medicines only as told by your doctor. This is very important if you take blood thinning medicine.  Do not take baths, swim, or use a hot tub until your doctor approves. Take showers instead of baths.  Wear compression stockings as told by your doctor.  It is up to you to get the results of your procedure. Ask your doctor when your results will be ready.  Keep all follow-up visits as told by your doctor. This is important. Contact a doctor if:  You have very bad cramps that get worse or do not get better with medicine.  You have very bad pain in your belly (abdomen).  You cannot drink fluids without throwing up (vomiting).  You get pain in a different part of the area between your belly and thighs (pelvis).  You have bad-smelling discharge from your vagina.  You have a rash. Get help right away if:  You are bleeding a lot from your vagina. A lot of bleeding means soaking more than one sanitary pad in an hour, for 2 hours in a row.  You have clumps of blood (blood clots) coming from your  vagina.  You have a fever or chills.  Your belly feels very tender or hard.  You have chest pain.  You have trouble breathing.  You cough up blood.  You feel dizzy.  You feel light-headed.  You pass out (faint).  You have pain in your neck or shoulder area. Summary  Take short walks often, followed by rest periods. Ask your doctor what activities are safe for you. After one or two days, you may be able to return to your normal activities.  Do not lift anything that is heavier than 10 lb (4.5 kg) until your doctor approves.  Do not take baths, swim, or use a hot tub until your doctor approves. Take showers instead of baths.  Contact your doctor if you have any symptoms of infection, like bad-smelling discharge from your vagina. This information is not intended to replace advice given to you by your health care provider. Make sure you discuss any questions you have with your health care provider. Document Released: 03/04/2008 Document Revised: 05/08/2017 Document Reviewed: 02/11/2016 Elsevier Patient Education  2020 [employer]   1) The drugs that you were given will stay in your system until tomorrow so for the next 24 hours you should not:  A) Drive an automobile B) Make any legal decisions C) Drink any alcoholic beverage   2) You may resume regular meals tomorrow.  Today it is  better to start with liquids and gradually work up to solid foods.  You may eat anything you prefer, but it is better to start with liquids, then soup and crackers, and gradually work up to solid foods.   3) Please notify your doctor immediately if you have any unusual bleeding, trouble breathing, redness and pain at the surgery site, drainage, fever, or pain not relieved by medication.    4) Additional Instructions:        Please contact your physician with any problems or Same Day Surgery at [phone], Monday through Friday 6 am  to 4 pm, or [place] at [facility] number at [phone].

## 2018-12-17 ENCOUNTER — Other Ambulatory Visit: Payer: Self-pay | Admitting: Obstetrics and Gynecology

## 2018-12-17 DIAGNOSIS — N9489 Other specified conditions associated with female genital organs and menstrual cycle: Secondary | ICD-10-CM

## 2018-12-17 DIAGNOSIS — L900 Lichen sclerosus et atrophicus: Secondary | ICD-10-CM

## 2018-12-17 MED ORDER — CLOBETASOL PROPIONATE 0.05 % EX OINT: TOPICAL_OINTMENT | CUTANEOUS | 5 refills | Status: DC

## 2018-12-17 NOTE — Progress Notes (Signed)
Discussed with patient on the phone.

## 2018-12-23 ENCOUNTER — Other Ambulatory Visit: Payer: Self-pay

## 2018-12-23 ENCOUNTER — Ambulatory Visit (INDEPENDENT_AMBULATORY_CARE_PROVIDER_SITE_OTHER): Payer: No Typology Code available for payment source

## 2018-12-23 DIAGNOSIS — N888 Other specified noninflammatory disorders of cervix uteri: Secondary | ICD-10-CM | POA: Diagnosis not present

## 2018-12-23 DIAGNOSIS — N9489 Other specified conditions associated with female genital organs and menstrual cycle: Secondary | ICD-10-CM

## 2018-12-24 ENCOUNTER — Ambulatory Visit (INDEPENDENT_AMBULATORY_CARE_PROVIDER_SITE_OTHER): Payer: No Typology Code available for payment source | Admitting: Obstetrics and Gynecology

## 2018-12-24 ENCOUNTER — Encounter: Payer: Self-pay | Admitting: Obstetrics and Gynecology

## 2018-12-24 VITALS — BP 142/88 | HR 77 | Ht 61.0 in | Wt 222.0 lb

## 2018-12-24 DIAGNOSIS — L900 Lichen sclerosus et atrophicus: Secondary | ICD-10-CM

## 2018-12-24 DIAGNOSIS — N950 Postmenopausal bleeding: Secondary | ICD-10-CM

## 2018-12-24 DIAGNOSIS — Z9889 Other specified postprocedural states: Secondary | ICD-10-CM

## 2018-12-24 NOTE — Progress Notes (Signed)
  Postoperative Follow-up Patient presents post op from operative hysteroscopy and vulvar biopsy for postmenopausal bleeding, 1 week ago.  Subjective: Patient reports marked improvement in her preop symptoms. Eating a regular diet without difficulty. The patient is not having any pain.  Activity: normal activities of daily living. Patient reports additional symptom's since surgery of None.  Objective: BP (!) 142/88   Pulse 77   Ht 5\' 1"  (1.549 m)   Wt 222 lb (100.7 kg)   BMI 41.95 kg/m  Physical Exam Constitutional:      Appearance: She is well-developed.  Genitourinary:     Vagina and uterus normal.     No lesions in the vagina.     No cervical motion tenderness.     No right or left adnexal mass present.  HENT:     Head: Normocephalic and atraumatic.  Neck:     Musculoskeletal: Neck supple.     Thyroid: No thyromegaly.  Cardiovascular:     Rate and Rhythm: Normal rate and regular rhythm.     Heart sounds: Normal heart sounds.  Pulmonary:     Effort: Pulmonary effort is normal.     Breath sounds: Normal breath sounds.  Chest:     Breasts:        Right: No inverted nipple, mass, nipple discharge or skin change.        Left: No inverted nipple, mass, nipple discharge or skin change.  Abdominal:     General: Bowel sounds are normal. There is no distension.     Palpations: Abdomen is soft. There is no mass.  Neurological:     Mental Status: She is alert and oriented to person, place, and time.  Skin:    General: Skin is warm and dry.  Psychiatric:        Behavior: Behavior normal.        Thought Content: Thought content normal.        Judgment: Judgment normal.  Vitals signs reviewed.    Assessment: s/p :  operative hysteroscopy and vulvar biopsy stable  Plan: Patient has done well after surgery with no apparent complications. She is feeling better with the use of clobetasol for lichen sclerosis.   I have discussed the post-operative course to date, and the expected  progress moving forward.  The patient understands what complications to be concerned about.  I will see the patient in routine follow up, or sooner if needed.    Activity plan: No restriction.  Pelvic rest.  Terren Jandreau R Birdie Fetty 12/24/2018, 5:02 PM

## 2018-12-24 NOTE — Patient Instructions (Signed)
LICHEN SIMPLEX CHRONICUS   Lichen Sclerosus Lichen sclerosus is a skin problem. It can happen on any part of the body. It happens most often in the anal or genital areas. It can cause itching and discomfort. Treatment can help to control symptoms. It can also help prevent scarring that may lead to other problems. What are the causes? The cause of this condition is not known. It is not passed from one person to another (not contagious). What increases the risk? This condition is more likely to develop in women. It most often occurs after menopause. What are the signs or symptoms? Symptoms of this condition include:  Thin, wrinkled, white areas on the skin.  Thickened white areas on the skin.  Red and swollen patches (lesions) on the skin.  Tears or cracks in the skin.  Bruising.  Blood blisters.  Very bad itching.  Pain, itching, or burning when peeing (urinating).  Trouble pooping (constipation). How is this diagnosed? This condition may be diagnosed with a physical exam. A sample of your skin may also be removed to be looked at under a microscope (biopsy). How is this treated? This condition may be treated with:  Creams or ointments (topical steroids) that are put on the skin in the affected areas. This is the most common treatment.  Medicines that are taken by mouth.  Surgery. This is only needed if the condition is very bad and is causing problems such as scarring. Follow these instructions at home:  Take or use over-the-counter and prescription medicines only as told by your doctor.  Use creams or ointments as told by your doctor.  Do not scratch the affected areas of skin.  If you are a woman, keep the vagina as clean and dry as you can.  Clean the affected area of skin gently with water. Avoid using rough towels or toilet paper.  Keep all follow-up visits as told by your doctor. This is important. Contact a doctor if:  Your redness, swelling, or pain gets  worse.  You have fluid, blood, or pus coming from the area.  You have new red and swollen patches on your skin.  You have a fever.  You have pain during sex. Summary  Lichen sclerosus is a skin problem. It can cause itching and discomfort.  This condition is usually treated with creams or ointments that are put on the skin in the affected areas.  Use medicines only as told by your doctor.  Do not scratch the affected areas of skin.  Keep all follow-up visits as told by your doctor. This is important. This information is not intended to replace advice given to you by your health care provider. Make sure you discuss any questions you have with your health care provider. Document Released: 05/08/2008 Document Revised: 10/08/2017 Document Reviewed: 10/08/2017 Elsevier Patient Education  2020 [employer].

## 2019-01-05 ENCOUNTER — Encounter: Payer: Self-pay | Admitting: Obstetrics and Gynecology

## 2019-01-05 ENCOUNTER — Telehealth: Payer: Self-pay | Admitting: Obstetrics and Gynecology

## 2019-01-05 NOTE — Telephone Encounter (Signed)
Spoke with Roselyn Reef, Dietitian, at [facility] re: pt's "BRCA 2 positive mutation with a special interpretation". Per Roselyn Reef, this particular mutation has a lower penetrance than the typical BRCA  2 mutation, meaning that the percent risks of the associated cancers are less than typical BRCA 2 mutation. The NCCN does not have guidelines on how to manage this mutation. Since she is BRCA 2 positive, a riskscore/TC score is not calculated for this pt.

## 2019-01-06 ENCOUNTER — Ambulatory Visit
Admission: RE | Admit: 2019-01-06 | Discharge: 2019-01-06 | Disposition: A | Discharge status: Home / Self Care | Payer: No Typology Code available for payment source | Source: Ambulatory Visit | Attending: Internal Medicine | Admitting: Internal Medicine

## 2019-01-06 ENCOUNTER — Telehealth: Payer: Self-pay | Admitting: Obstetrics and Gynecology

## 2019-01-06 DIAGNOSIS — Z1231 Encounter for screening mammogram for malignant neoplasm of breast: Secondary | ICD-10-CM | POA: Diagnosis present

## 2019-01-06 DIAGNOSIS — R928 Other abnormal and inconclusive findings on diagnostic imaging of breast: Secondary | ICD-10-CM

## 2019-01-06 NOTE — Telephone Encounter (Signed)
Dr. Gilman Schmidt would like patient schedule either phone or in office to discuss patient's myriad results. I called and left voicemail for patient to call back to be schedule.

## 2019-01-11 LAB — SURGICAL PATHOLOGY

## 2019-01-20 ENCOUNTER — Ambulatory Visit: Payer: No Typology Code available for payment source | Admitting: Internal Medicine

## 2019-02-04 ENCOUNTER — Other Ambulatory Visit: Payer: Self-pay | Admitting: Internal Medicine

## 2019-02-25 ENCOUNTER — Ambulatory Visit (INDEPENDENT_AMBULATORY_CARE_PROVIDER_SITE_OTHER): Payer: No Typology Code available for payment source | Admitting: Obstetrics and Gynecology

## 2019-02-25 ENCOUNTER — Other Ambulatory Visit: Payer: Self-pay

## 2019-02-25 ENCOUNTER — Encounter: Payer: Self-pay | Admitting: Obstetrics and Gynecology

## 2019-02-25 VITALS — BP 150/84 | HR 81 | Ht 61.0 in | Wt 225.0 lb

## 2019-02-25 DIAGNOSIS — Z85828 Personal history of other malignant neoplasm of skin: Secondary | ICD-10-CM

## 2019-02-25 DIAGNOSIS — Z1509 Genetic susceptibility to other malignant neoplasm: Secondary | ICD-10-CM | POA: Diagnosis not present

## 2019-02-25 DIAGNOSIS — Z1501 Genetic susceptibility to malignant neoplasm of breast: Secondary | ICD-10-CM

## 2019-02-25 NOTE — Progress Notes (Signed)
Patient ID: [name], female   DOB: 03-Sep-1960, 58 y.o.   MRN: [id number]  Reason for Consult: Follow-up (Myriad results )   Referred by Glean Hess, MD  Subjective:     HPI:  [name] is a 58 y.o. female. She presents today for discussion of her myrisk testing results which showed that she was BRCA2 positive.   Past Medical History:  Diagnosis Date  . Allergic rhinitis   . Arthritis   . Asthma   . Basal cell carcinoma   . BRCA2 gene mutation positive 12/2018   MyRisk BRCA 2 positive with special interpretation--mutation has less penetrance than usual BRCA 2 mutation  . Bursitis   . Complication of anesthesia    thrashing and screaming  . Dermatitis, eczematoid 09/30/2011  . Duodenal ulcer   . Family history of breast cancer   . Fibromyalgia   . Gastritis   . GERD (gastroesophageal reflux disease)   . Heart murmur    asymptomatic  . Hiatal hernia   . Hypertension   . Hypothyroidism   . IBS (irritable bowel syndrome)   . OSA (obstructive sleep apnea)    has CPAP  . Psoriasis   . Rheumatic fever    Family History  Problem Relation Age of Onset  . Breast cancer Mother 40  . CAD Father   . Heart attack Father   . Bladder Cancer Father 6  . Breast cancer Paternal Grandmother 74  . Leukemia Paternal Grandmother 43  . Diabetes Other        multiple family members  . Breast cancer Maternal Aunt 32   Past Surgical History:  Procedure Laterality Date  . APPENDECTOMY    . BREAST BIOPSY Left 2007   benign, dr Bary Castilla  . CHOLECYSTECTOMY    . HYSTEROSCOPY W/D&C N/A 12/14/2018   Procedure: DILATATION AND CURETTAGE /HYSTEROSCOPY WITH MYOSURE, VULVAR BIOPSY;  Surgeon: Homero Fellers, MD;  Location: [facility] ORS;  Service: Gynecology;  Laterality: N/A;  . SKIN CANCER EXCISION     BCCA of face  . TONSILECTOMY, ADENOIDECTOMY, BILATERAL MYRINGOTOMY AND TUBES      Short Social History:  Social History   Tobacco Use  . Smoking status: Never  Smoker  . Smokeless tobacco: Never Used  Substance Use Topics  . Alcohol use: Yes    Alcohol/week: 2.0 standard drinks    Types: 2 Standard drinks or equivalent per week    Comment: rarely    Allergies  Allergen Reactions  . Sulfa Antibiotics Other (See Comments)    seizure  . Amlodipine Swelling    Leg swelling  . Furosemide Swelling  . Lyrica [Pregabalin] Other (See Comments)    Dizziness and blurred vision  . Hydrochlorothiazide Itching and Rash    Current Outpatient Medications  Medication Sig Dispense Refill  . acetaminophen (TYLENOL) 500 MG tablet Take 2 tablets (1,000 mg total) by mouth every 6 (six) hours as needed. 60 tablet 1  . albuterol (PROVENTIL HFA;VENTOLIN HFA) 108 (90 BASE) MCG/ACT inhaler Inhale 2 puffs into the lungs 4 (four) times daily as needed. 18 g 12  . aspirin 81 MG EC tablet Take 81 mg by mouth at bedtime.     Marland Kitchen BYSTOLIC 10 MG tablet TAKE 1 TABLET BY MOUTH DAILY (Patient taking differently: Take 10 mg by mouth at bedtime. ) 90 tablet 1  . cetirizine (ZYRTEC) 10 MG tablet Take 10 mg by mouth daily.    . cholestyramine light (PREVALITE) 4 g  packet MIX 1 PACKET AS DIRECTED AND DRINK ONCE DAILY (Patient taking differently: Take 4 g by mouth daily. ) 90 packet 1  . clobetasol ointment (TEMOVATE) 0.05 % Apply to affected area every night for 4 weeks, then every other day for 4 weeks and then twice a week for 4 weeks or until resolution. 30 g 5  . cyclobenzaprine (FLEXERIL) 5 MG tablet TAKE 3 TABLETS BY MOUTH TWO TIMES DAILY (Patient taking differently: Take 15 mg by mouth 2 (two) times a day. ) 540 tablet 0  . diphenhydrAMINE (BENADRYL) 25 MG tablet Take 25-50 mg by mouth every 6 (six) hours as needed (allergies/asthma).    . doxazosin (CARDURA) 2 MG tablet TAKE 1 TABLET (2 MG TOTAL) BY MOUTH DAILY. (Patient taking differently: Take 2 mg by mouth at bedtime. ) 90 tablet 3  . doxepin (SINEQUAN) 100 MG capsule TAKE 1 CAPSULE BY MOUTH ONCE DAILY AT BEDTIME (Patient  taking differently: Take 100 mg by mouth at bedtime. ) 90 capsule 3  . esomeprazole (NEXIUM) 40 MG capsule TAKE 1 CAPSULE (40 MG TOTAL) BY MOUTH AT BEDTIME. (Patient taking differently: Take 40 mg by mouth daily before supper. (1700)) 90 capsule 3  . ibuprofen (ADVIL) 800 MG tablet Take 1 tablet (800 mg total) by mouth every 8 (eight) hours as needed for cramping. 30 tablet 1  . levothyroxine (SYNTHROID) 200 MCG tablet TAKE 1 TABLET (200 MCG TOTAL) BY MOUTH DAILY. 90 tablet 3  . lisinopril (ZESTRIL) 40 MG tablet TAKE 1 TABLET BY MOUTH DAILY 90 tablet 0  . loperamide (IMODIUM A-D) 2 MG tablet Take 2-4 mg by mouth 4 (four) times daily as needed for diarrhea or loose stools.     . meloxicam (MOBIC) 15 MG tablet TAKE 1 TABLET (15 MG TOTAL) BY MOUTH DAILY. 90 tablet 3  . montelukast (SINGULAIR) 10 MG tablet TAKE 1 TABLET BY MOUTH NIGHTLY AT BEDTIME (Patient taking differently: Take 10 mg by mouth at bedtime. ) 90 tablet 3  . Multiple Vitamin (MULTIVITAMIN WITH MINERALS) TABS tablet Take 1 tablet by mouth daily.    Vladimir Faster Glycol-Propyl Glycol (LUBRICANT EYE DROPS) 0.4-0.3 % SOLN Place 1 drop into both eyes 3 (three) times daily as needed (allergy eyes.).    Marland Kitchen traMADol (ULTRAM) 50 MG tablet TAKE 1 TABLET BY MOUTH EVERY 12 HOURS AS NEEDED 60 tablet 0  . traZODone (DESYREL) 50 MG tablet Take 1-2 tablets (50-100 mg total) by mouth at bedtime as needed for sleep. 180 tablet 1  . vitamin B-12 (CYANOCOBALAMIN) 1000 MCG tablet Take 1,000 mcg by mouth daily.     No current facility-administered medications for this visit.     Review of Systems  Constitutional: Negative for chills, fatigue, fever and unexpected weight change.  HENT: Negative for trouble swallowing.  Eyes: Negative for loss of vision.  Respiratory: Negative for cough, shortness of breath and wheezing.  Cardiovascular: Negative for chest pain, leg swelling, palpitations and syncope.  GI: Negative for abdominal pain, blood in stool,  diarrhea, nausea and vomiting.  GU: Negative for difficulty urinating, dysuria, frequency and hematuria.  Musculoskeletal: Negative for back pain, leg pain and joint pain.  Skin: Negative for rash.  Neurological: Negative for dizziness, headaches, light-headedness, numbness and seizures.  Psychiatric: Negative for behavioral problem, confusion, depressed mood and sleep disturbance.        Objective:  Objective   Vitals:   02/25/19 0945  BP: (!) 150/84  Pulse: 81  Weight: 225 lb (102.1 kg)  Height: '5\' 1"'$  (1.549 m)   Body mass index is 42.51 kg/m.  Physical Exam Vitals signs and nursing note reviewed.  Constitutional:      Appearance: She is well-developed.  HENT:     Head: Normocephalic and atraumatic.     Comments: Concerning mole present Eyes:     Pupils: Pupils are equal, round, and reactive to light.  Cardiovascular:     Rate and Rhythm: Normal rate and regular rhythm.  Pulmonary:     Effort: Pulmonary effort is normal. No respiratory distress.  Skin:    General: Skin is warm and dry.  Neurological:     Mental Status: She is alert and oriented to person, place, and time.  Psychiatric:        Behavior: Behavior normal.        Thought Content: Thought content normal.        Judgment: Judgment normal.        Assessment/Plan:     58 yo G2P2002 here for BRCA 2 positive myrisk testing. Long discussion regarding this genetic test result and implications of future risks of breast and ovarian cancer. Reading material from ACOG and [email or web address] given to patient.  Discussed BSO possible hysterectomy given continued PMB. Recent D&C showed normal pathology.  Referral dermatology for mole on face and hx basal cell cancer Oncology referral for further counseling regarding mastectomy vs. Tamoxifen chemoprevention therapy and further consultation. Recommended testing for her family members as well.   More than 30 minutes were spent face to face with the patient in the room  with more than 50% of the time spent providing counseling and discussing the plan of management.   Adrian Prows MD [facility] 02/25/2019 11:09 AM

## 2019-03-03 DIAGNOSIS — Z1501 Genetic susceptibility to malignant neoplasm of breast: Secondary | ICD-10-CM | POA: Insufficient documentation

## 2019-03-03 DIAGNOSIS — Z1509 Genetic susceptibility to other malignant neoplasm: Secondary | ICD-10-CM | POA: Insufficient documentation

## 2019-03-04 NOTE — Progress Notes (Signed)
Received request from [facility] for referral to [place], but after talking to Jeanella Anton who discussed case with Denyse Dago, it is determined patient to be referred to [facility].  Left message for Izora Gala to make referral.

## 2019-03-10 ENCOUNTER — Ambulatory Visit: Payer: No Typology Code available for payment source | Admitting: Obstetrics and Gynecology

## 2019-03-10 ENCOUNTER — Other Ambulatory Visit: Payer: Self-pay | Admitting: Internal Medicine

## 2019-03-15 ENCOUNTER — Ambulatory Visit: Payer: No Typology Code available for payment source | Admitting: Obstetrics and Gynecology

## 2019-03-21 NOTE — Progress Notes (Signed)
[place]  [phone] [phone]  ID: Salley Hews OB: 08-May-1961  MR#: [id number]  OTR#:[id number]  Patient Care Team: Glean Hess, MD as PCP - General (Internal Medicine) Ralene Bathe, MD (Dermatology)  CHIEF COMPLAINT: BRCA-2 positive with c.4284dupT variant.  INTERVAL HISTORY: Patient is a 58 year old female with no personal history of breast cancer who recently underwent genetic testing and is found to have a variant mutation of BCRA-2.  She is referred for further evaluation and counseling.  She currently feels well and is asymptomatic.  She denies any recent fevers or illnesses.  She has a good appetite and denies weight loss.  She has no pain.  She does self breast exams and has noticed no abnormalities.  She has no neurologic complaints.  She denies any chest pain, shortness of breath, cough, or hemoptysis.  She denies any nausea, vomiting, constipation, or diarrhea.  She has no urinary complaints.  Patient feels at her baseline offers no specific complaints today.  REVIEW OF SYSTEMS:   Review of Systems  Constitutional: Negative.  Negative for fever, malaise/fatigue and weight loss.  Respiratory: Negative.  Negative for cough, hemoptysis and shortness of breath.   Cardiovascular: Negative.  Negative for chest pain and leg swelling.  Gastrointestinal: Negative.  Negative for abdominal pain.  Genitourinary: Negative.  Negative for dysuria.  Musculoskeletal: Negative.  Negative for back pain.  Skin: Negative.  Negative for rash.  Neurological: Negative.  Negative for dizziness, focal weakness, weakness and headaches.  Psychiatric/Behavioral: Negative.  The patient is not nervous/anxious.     As per HPI. Otherwise, a complete review of systems is negative.  PAST MEDICAL HISTORY: Past Medical History:  Diagnosis Date  . Allergic rhinitis   . Arthritis   . Asthma   . Basal cell carcinoma   . BRCA2 gene mutation positive  12/2018   MyRisk BRCA 2 positive with special interpretation--mutation has less penetrance than usual BRCA 2 mutation  . Bursitis   . Complication of anesthesia    thrashing and screaming  . Dermatitis, eczematoid 09/30/2011  . Duodenal ulcer   . Family history of breast cancer   . Fibromyalgia   . Gastritis   . GERD (gastroesophageal reflux disease)   . Heart murmur    asymptomatic  . Hiatal hernia   . Hypertension   . Hypothyroidism   . IBS (irritable bowel syndrome)   . OSA (obstructive sleep apnea)    has CPAP  . Psoriasis   . Rheumatic fever     PAST SURGICAL HISTORY: Past Surgical History:  Procedure Laterality Date  . APPENDECTOMY    . BREAST BIOPSY Left 2007   benign, dr Bary Castilla  . CHOLECYSTECTOMY    . HYSTEROSCOPY W/D&C N/A 12/14/2018   Procedure: DILATATION AND CURETTAGE /HYSTEROSCOPY WITH MYOSURE, VULVAR BIOPSY;  Surgeon: Homero Fellers, MD;  Location: [facility] ORS;  Service: Gynecology;  Laterality: N/A;  . SKIN CANCER EXCISION     BCCA of face  . TONSILECTOMY, ADENOIDECTOMY, BILATERAL MYRINGOTOMY AND TUBES      FAMILY HISTORY: Family History  Problem Relation Age of Onset  . Breast cancer Mother 14  . CAD Father   . Heart attack Father   . Bladder Cancer Father 77  . Breast cancer Paternal Grandmother 2  . Leukemia Paternal Grandmother 68  . Diabetes Other        multiple family members  . Breast cancer Maternal Aunt 15    ADVANCED DIRECTIVES (Y/N):  N  HEALTH MAINTENANCE: Social History   Tobacco Use  . Smoking status: Never Smoker  . Smokeless tobacco: Never Used  Substance Use Topics  . Alcohol use: Yes    Alcohol/week: 2.0 standard drinks    Types: 2 Standard drinks or equivalent per week    Comment: rarely  . Drug use: No     Colonoscopy:  PAP:  Bone density:  Lipid panel:  Allergies  Allergen Reactions  . Sulfa Antibiotics Other (See Comments)    seizure  . Amlodipine Swelling    Leg swelling  . Furosemide Swelling  .  Lyrica [Pregabalin] Other (See Comments)    Dizziness and blurred vision  . Hydrochlorothiazide Itching and Rash    Current Outpatient Medications  Medication Sig Dispense Refill  . albuterol (PROVENTIL HFA;VENTOLIN HFA) 108 (90 BASE) MCG/ACT inhaler Inhale 2 puffs into the lungs 4 (four) times daily as needed. 18 g 12  . aspirin 81 MG EC tablet Take 81 mg by mouth at bedtime.     Marland Kitchen BYSTOLIC 10 MG tablet TAKE 1 TABLET BY MOUTH DAILY 90 tablet 1  . cetirizine (ZYRTEC) 10 MG tablet Take 10 mg by mouth daily.    . cholestyramine light (PREVALITE) 4 g packet MIX 1 PACKET AS DIRECTED AND DRINK ONCE DAILY (Patient taking differently: Take 4 g by mouth daily. ) 90 packet 1  . clobetasol ointment (TEMOVATE) 0.05 % Apply to affected area every night for 4 weeks, then every other day for 4 weeks and then twice a week for 4 weeks or until resolution. 30 g 5  . cyclobenzaprine (FLEXERIL) 5 MG tablet TAKE 3 TABLETS BY MOUTH TWO TIMES DAILY (Patient taking differently: 30 mg 2 (two) times daily. 15 MG AM and 15 MG PM) 540 tablet 0  . doxazosin (CARDURA) 2 MG tablet TAKE 1 TABLET (2 MG TOTAL) BY MOUTH DAILY. (Patient taking differently: Take 2 mg by mouth at bedtime. ) 90 tablet 3  . doxepin (SINEQUAN) 100 MG capsule TAKE 1 CAPSULE BY MOUTH ONCE DAILY AT BEDTIME (Patient taking differently: Take 100 mg by mouth at bedtime. ) 90 capsule 3  . esomeprazole (NEXIUM) 40 MG capsule TAKE 1 CAPSULE (40 MG TOTAL) BY MOUTH AT BEDTIME. (Patient taking differently: Take 40 mg by mouth daily before supper. (1700)) 90 capsule 3  . ibuprofen (ADVIL) 800 MG tablet Take 1 tablet (800 mg total) by mouth every 8 (eight) hours as needed for cramping. 30 tablet 1  . levothyroxine (SYNTHROID) 200 MCG tablet TAKE 1 TABLET (200 MCG TOTAL) BY MOUTH DAILY. 90 tablet 3  . lisinopril (ZESTRIL) 40 MG tablet TAKE 1 TABLET BY MOUTH DAILY 90 tablet 0  . loperamide (IMODIUM A-D) 2 MG tablet Take 2-4 mg by mouth 4 (four) times daily as needed  for diarrhea or loose stools.     . meloxicam (MOBIC) 15 MG tablet TAKE 1 TABLET (15 MG TOTAL) BY MOUTH DAILY. 90 tablet 3  . montelukast (SINGULAIR) 10 MG tablet TAKE 1 TABLET BY MOUTH NIGHTLY AT BEDTIME (Patient taking differently: Take 10 mg by mouth at bedtime. ) 90 tablet 3  . Multiple Vitamin (MULTIVITAMIN WITH MINERALS) TABS tablet Take 1 tablet by mouth daily.    Vladimir Faster Glycol-Propyl Glycol (LUBRICANT EYE DROPS) 0.4-0.3 % SOLN Place 1 drop into both eyes 3 (three) times daily as needed (allergy eyes.).    Marland Kitchen traMADol (ULTRAM) 50 MG tablet TAKE 1 TABLET BY MOUTH EVERY 12 HOURS AS NEEDED 60 tablet 0  .  traZODone (DESYREL) 50 MG tablet Take 1-2 tablets (50-100 mg total) by mouth at bedtime as needed for sleep. 180 tablet 1  . vitamin B-12 (CYANOCOBALAMIN) 1000 MCG tablet Take 1,000 mcg by mouth daily.    Marland Kitchen acetaminophen (TYLENOL) 500 MG tablet Take 2 tablets (1,000 mg total) by mouth every 6 (six) hours as needed. (Patient not taking: Reported on 03/25/2019) 60 tablet 1  . diphenhydrAMINE (BENADRYL) 25 MG tablet Take 25-50 mg by mouth every 6 (six) hours as needed (allergies/asthma).     No current facility-administered medications for this visit.     OBJECTIVE: Vitals:   03/25/19 1502  BP: (!) 149/89  Pulse: 87  Temp: 97.8 F (36.6 C)     Body mass index is 42.51 kg/m.    ECOG FS:0 - Asymptomatic  General: Well-developed, well-nourished, no acute distress. Eyes: Pink conjunctiva, anicteric sclera. HEENT: Normocephalic, moist mucous membranes, clear oropharnyx. Lungs: Clear to auscultation bilaterally. Heart: Regular rate and rhythm. No rubs, murmurs, or gallops. Abdomen: Soft, nontender, nondistended. No organomegaly noted, normoactive bowel sounds. Musculoskeletal: No edema, cyanosis, or clubbing. Neuro: Alert, answering all questions appropriately. Cranial nerves grossly intact. Skin: No rashes or petechiae noted. Psych: Normal affect. Lymphatics: No cervical,  calvicular, axillary or inguinal LAD.   LAB RESULTS:  Lab Results  Component Value Date   NA 139 11/18/2018   K 4.7 11/18/2018   CL 99 11/18/2018   CO2 22 11/18/2018   GLUCOSE 120 (H) 11/18/2018   BUN 14 11/18/2018   CREATININE 0.59 11/18/2018   CALCIUM 9.3 11/18/2018   PROT 6.8 11/18/2018   ALBUMIN 4.2 11/18/2018   AST 21 11/18/2018   ALT 15 11/18/2018   ALKPHOS 101 11/18/2018   BILITOT 0.4 11/18/2018   GFRNONAA 101 11/18/2018   GFRAA 116 11/18/2018    Lab Results  Component Value Date   WBC 12.5 (H) 12/14/2018   NEUTROABS 7.1 (H) 11/18/2018   HGB 14.5 12/14/2018   HCT 44.6 12/14/2018   MCV 97.2 12/14/2018   PLT 344 12/14/2018     STUDIES: No results found.  ASSESSMENT:  BRCA-2 positive with c.4284dupT variant.  PLAN:    1.  BRCA-2 positive with c.4284dupT variant: Although patient is at significantly higher risk of developing breast cancer over the general population, the risk is not as high as what is typically observed for standard BCRA-2 germline mutation.  Other than breast cancer, patient is also at increased risk for ovarian, pancreatic, and melanoma.  Given patient's age and per NCCN guidelines have recommended that patient proceed with breast MRI screening.  She will also require, at minimum, yearly diagnostic mammograms.  She is also considering prophylactic bilateral mastectomy, but is unsure and wishes to further discuss with a Psychiatric nurse.  A referral has been made to Dr. Audelia Hives.  She plans to have regular visits with a dermatologist.  She also recently was noted to have abnormal postmenopausal vaginal bleeding and is likely going to proceed with a total hysterectomy.  She has 2 children in their 66s.  They have a 50% chance of having the mutated gene from their mother and have recommended that both children be tested.  Finally, the data is unclear for prophylactic tamoxifen, particularly in her age range, but patient is considering this as  well. This can be prescribed by her PCP if desired. No further follow-up has been scheduled.  Please refer patient back if there are any questions or concerns.  Patient expressed understanding and was in agreement  with this plan. She also understands that She can call clinic at any time with any questions, concerns, or complaints.   I spent a total of 60 minutes face-to-face with the patient of which greater than 50% of the visit was spent in counseling and coordination of care as detailed above.   Lloyd Huger, MD   03/28/2019 6:54 AM

## 2019-03-24 ENCOUNTER — Other Ambulatory Visit: Payer: Self-pay

## 2019-03-25 ENCOUNTER — Inpatient Hospital Stay: Payer: No Typology Code available for payment source | Attending: Oncology | Admitting: Oncology

## 2019-03-25 ENCOUNTER — Encounter: Payer: Self-pay | Admitting: Oncology

## 2019-03-25 ENCOUNTER — Other Ambulatory Visit: Payer: Self-pay

## 2019-03-25 VITALS — BP 149/89 | HR 87 | Temp 97.8°F | Ht 61.0 in | Wt 225.0 lb

## 2019-03-25 DIAGNOSIS — Z8052 Family history of malignant neoplasm of bladder: Secondary | ICD-10-CM | POA: Diagnosis not present

## 2019-03-25 DIAGNOSIS — Z1509 Genetic susceptibility to other malignant neoplasm: Secondary | ICD-10-CM

## 2019-03-25 DIAGNOSIS — Z1501 Genetic susceptibility to malignant neoplasm of breast: Secondary | ICD-10-CM | POA: Diagnosis not present

## 2019-03-25 DIAGNOSIS — Z803 Family history of malignant neoplasm of breast: Secondary | ICD-10-CM | POA: Insufficient documentation

## 2019-03-25 NOTE — Progress Notes (Signed)
Patient stated that Dr. Gilman Schmidt recommended a MRI of the breast, left in January 2021. Patient's last mammogram was done on 01/06/2019.

## 2019-04-04 ENCOUNTER — Encounter: Payer: Self-pay | Admitting: Obstetrics and Gynecology

## 2019-04-04 ENCOUNTER — Other Ambulatory Visit: Payer: Self-pay

## 2019-04-04 ENCOUNTER — Ambulatory Visit (INDEPENDENT_AMBULATORY_CARE_PROVIDER_SITE_OTHER): Payer: No Typology Code available for payment source | Admitting: Obstetrics and Gynecology

## 2019-04-04 VITALS — BP 140/88 | HR 83 | Ht 61.0 in | Wt 223.0 lb

## 2019-04-04 DIAGNOSIS — Z1509 Genetic susceptibility to other malignant neoplasm: Secondary | ICD-10-CM | POA: Diagnosis not present

## 2019-04-04 DIAGNOSIS — N950 Postmenopausal bleeding: Secondary | ICD-10-CM | POA: Diagnosis not present

## 2019-04-04 DIAGNOSIS — Z298 Encounter for other specified prophylactic measures: Secondary | ICD-10-CM

## 2019-04-04 DIAGNOSIS — Z1501 Genetic susceptibility to malignant neoplasm of breast: Secondary | ICD-10-CM | POA: Diagnosis not present

## 2019-04-04 MED ORDER — TAMOXIFEN CITRATE 20 MG PO TABS: 20.0000 mg | ORAL_TABLET | Freq: Every day | ORAL | 11 refills | Status: DC

## 2019-04-04 NOTE — Progress Notes (Signed)
Patient ID: [name], female   DOB: [birth year], 59 y.o.   MRN: [id number]  Reason for Consult: Consult (Would like to do complete Hysterectomy )   Referred by Glean Hess, MD  Subjective:     HPI:  [name] is a 58 y.o. female. She was recently diagnosed with a BRCA 2 mutation. She has met with oncology and is interested in having a hysterectomy, BSO for her BRCA history. She is interested ins starting tamoxifen as chemoprevention. She has follow up planned to discuss prophylactic bilateral mastectomy with plastic surgery. Her mother went through a mastectomy and she had a difficult recovery and never regained full functioning of her arm. For this reason she is not inclined to have a prophylactic mastectomy at this time.  Past Medical History:  Diagnosis Date  . Allergic rhinitis   . Arthritis   . Asthma   . Basal cell carcinoma   . BRCA2 gene mutation positive 12/2018   MyRisk BRCA 2 positive with special interpretation--mutation has less penetrance than usual BRCA 2 mutation  . Bursitis   . Complication of anesthesia    thrashing and screaming  . Dermatitis, eczematoid 09/30/2011  . Duodenal ulcer   . Family history of breast cancer   . Fibromyalgia   . Gastritis   . GERD (gastroesophageal reflux disease)   . Heart murmur    asymptomatic  . Hiatal hernia   . Hypertension   . Hypothyroidism   . IBS (irritable bowel syndrome)   . OSA (obstructive sleep apnea)    has CPAP  . Psoriasis   . Rheumatic fever    Family History  Problem Relation Age of Onset  . Breast cancer Mother 12  . CAD Father   . Heart attack Father   . Bladder Cancer Father 59  . Breast cancer Paternal Grandmother 76  . Leukemia Paternal Grandmother 66  . Diabetes Other        multiple family members  . Breast cancer Maternal Aunt 32   Past Surgical History:  Procedure Laterality Date  . APPENDECTOMY    . BREAST BIOPSY Left 2007   benign, dr Bary Castilla  . CHOLECYSTECTOMY     . HYSTEROSCOPY W/D&C N/A 12/14/2018   Procedure: DILATATION AND CURETTAGE /HYSTEROSCOPY WITH MYOSURE, VULVAR BIOPSY;  Surgeon: Homero Fellers, MD;  Location: [facility] ORS;  Service: Gynecology;  Laterality: N/A;  . SKIN BIOPSY Left    Back on L leg   . SKIN CANCER EXCISION     BCCA of face  . TONSILECTOMY, ADENOIDECTOMY, BILATERAL MYRINGOTOMY AND TUBES      Short Social History:  Social History   Tobacco Use  . Smoking status: Never Smoker  . Smokeless tobacco: Never Used  Substance Use Topics  . Alcohol use: Yes    Alcohol/week: 2.0 standard drinks    Types: 2 Standard drinks or equivalent per week    Comment: rarely    Allergies  Allergen Reactions  . Sulfa Antibiotics Other (See Comments)    seizure  . Amlodipine Swelling    Leg swelling  . Furosemide Swelling  . Lyrica [Pregabalin] Other (See Comments)    Dizziness and blurred vision  . Hydrochlorothiazide Itching and Rash    Current Outpatient Medications  Medication Sig Dispense Refill  . acetaminophen (TYLENOL) 500 MG tablet Take 2 tablets (1,000 mg total) by mouth every 6 (six) hours as needed. 60 tablet 1  . albuterol (PROVENTIL HFA;VENTOLIN HFA) 108 (90 BASE) MCG/ACT  inhaler Inhale 2 puffs into the lungs 4 (four) times daily as needed. 18 g 12  . aspirin 81 MG EC tablet Take 81 mg by mouth at bedtime.     Marland Kitchen BYSTOLIC 10 MG tablet TAKE 1 TABLET BY MOUTH DAILY 90 tablet 1  . cetirizine (ZYRTEC) 10 MG tablet Take 10 mg by mouth daily.    . cholestyramine light (PREVALITE) 4 g packet MIX 1 PACKET AS DIRECTED AND DRINK ONCE DAILY (Patient taking differently: Take 4 g by mouth daily. ) 90 packet 1  . clobetasol ointment (TEMOVATE) 0.05 % Apply to affected area every night for 4 weeks, then every other day for 4 weeks and then twice a week for 4 weeks or until resolution. 30 g 5  . cyclobenzaprine (FLEXERIL) 5 MG tablet TAKE 3 TABLETS BY MOUTH TWO TIMES DAILY (Patient taking differently: 30 mg 2 (two) times daily. 15 MG  AM and 15 MG PM) 540 tablet 0  . diphenhydrAMINE (BENADRYL) 25 MG tablet Take 25-50 mg by mouth every 6 (six) hours as needed (allergies/asthma).    . doxazosin (CARDURA) 2 MG tablet TAKE 1 TABLET (2 MG TOTAL) BY MOUTH DAILY. (Patient taking differently: Take 2 mg by mouth at bedtime. ) 90 tablet 3  . doxepin (SINEQUAN) 100 MG capsule TAKE 1 CAPSULE BY MOUTH ONCE DAILY AT BEDTIME (Patient taking differently: Take 100 mg by mouth at bedtime. ) 90 capsule 3  . esomeprazole (NEXIUM) 40 MG capsule TAKE 1 CAPSULE (40 MG TOTAL) BY MOUTH AT BEDTIME. (Patient taking differently: Take 40 mg by mouth daily before supper. (1700)) 90 capsule 3  . ibuprofen (ADVIL) 800 MG tablet Take 1 tablet (800 mg total) by mouth every 8 (eight) hours as needed for cramping. 30 tablet 1  . levothyroxine (SYNTHROID) 200 MCG tablet TAKE 1 TABLET (200 MCG TOTAL) BY MOUTH DAILY. 90 tablet 3  . lisinopril (ZESTRIL) 40 MG tablet TAKE 1 TABLET BY MOUTH DAILY 90 tablet 0  . loperamide (IMODIUM A-D) 2 MG tablet Take 2-4 mg by mouth 4 (four) times daily as needed for diarrhea or loose stools.     . meloxicam (MOBIC) 15 MG tablet TAKE 1 TABLET (15 MG TOTAL) BY MOUTH DAILY. 90 tablet 3  . montelukast (SINGULAIR) 10 MG tablet TAKE 1 TABLET BY MOUTH NIGHTLY AT BEDTIME (Patient taking differently: Take 10 mg by mouth at bedtime. ) 90 tablet 3  . Multiple Vitamin (MULTIVITAMIN WITH MINERALS) TABS tablet Take 1 tablet by mouth daily.    Vladimir Faster Glycol-Propyl Glycol (LUBRICANT EYE DROPS) 0.4-0.3 % SOLN Place 1 drop into both eyes 3 (three) times daily as needed (allergy eyes.).    Marland Kitchen traMADol (ULTRAM) 50 MG tablet TAKE 1 TABLET BY MOUTH EVERY 12 HOURS AS NEEDED 60 tablet 0  . traZODone (DESYREL) 50 MG tablet Take 1-2 tablets (50-100 mg total) by mouth at bedtime as needed for sleep. 180 tablet 1  . vitamin B-12 (CYANOCOBALAMIN) 1000 MCG tablet Take 1,000 mcg by mouth daily.    . tamoxifen (NOLVADEX) 20 MG tablet Take 1 tablet (20 mg total)  by mouth daily. 30 tablet 11   No current facility-administered medications for this visit.     Review of Systems  Constitutional: Negative for chills, fatigue, fever and unexpected weight change.  HENT: Negative for trouble swallowing.  Eyes: Negative for loss of vision.  Respiratory: Negative for cough, shortness of breath and wheezing.  Cardiovascular: Negative for chest pain, leg swelling, palpitations and syncope.  GI: Negative for abdominal pain, blood in stool, diarrhea, nausea and vomiting.  GU: Negative for difficulty urinating, dysuria, frequency and hematuria.  Musculoskeletal: Negative for back pain, leg pain and joint pain.  Skin: Negative for rash.  Neurological: Negative for dizziness, headaches, light-headedness, numbness and seizures.  Psychiatric: Negative for behavioral problem, confusion, depressed mood and sleep disturbance.        Objective:  Objective   Vitals:   04/04/19 1328  BP: 140/88  Pulse: 83  Weight: 223 lb (101.2 kg)  Height: '5\' 1"'$  (1.549 m)   Body mass index is 42.14 kg/m.  Physical Exam Vitals signs and nursing note reviewed.  Constitutional:      Appearance: She is well-developed.  HENT:     Head: Normocephalic and atraumatic.  Eyes:     Pupils: Pupils are equal, round, and reactive to light.  Cardiovascular:     Rate and Rhythm: Normal rate and regular rhythm.  Pulmonary:     Effort: Pulmonary effort is normal. No respiratory distress.  Skin:    General: Skin is warm and dry.  Neurological:     Mental Status: She is alert and oriented to person, place, and time.  Psychiatric:        Behavior: Behavior normal.        Thought Content: Thought content normal.        Judgment: Judgment normal.        Assessment/Plan:     58 yo BRCA 2 positive with recent postmenopausal bleeding Discussed option with patient for prophylactic BSO vs hysterectomy, she is interested in a TLH BSO. Discussed risks and normal recovery time of this  surgery in detail with [facility].  She reminded me that she is a [place] witness and she would never under any circumstance want a blood transfusion. She is okay with blood expansion products but she is not okay with the use of a device such as the cell saver where the blood is outside of her body and not continually transfused.  Will schedule and plan close follow up with patient.   Discussed chemoprevention with tamoxifen with the patient. Will start her on 5 year therapy course. Prescription sent.  More than 25 minutes were spent face to face with the patient in the room with more than 50% of the time spent providing counseling and discussing the plan of management.    Adrian Prows MD [facility], [facility] 04/04/2019 6:07 PM

## 2019-04-04 NOTE — Patient Instructions (Signed)
Tamoxifen oral tablet What is this medicine? TAMOXIFEN (ta MOX i fen) blocks the effects of estrogen. It is commonly used to treat breast cancer. It is also used to decrease the chance of breast cancer coming back in women who have received treatment for the disease. It may also help prevent breast cancer in women who have a high risk of developing breast cancer. This medicine may be used for other purposes; ask your health care provider or pharmacist if you have questions. COMMON BRAND NAME(S): Nolvadex What should I tell my health care provider before I take this medicine? They need to know if you have any of these conditions:  blood clots  blood disease  cataracts or impaired eyesight  endometriosis  high calcium levels  high cholesterol  irregular menstrual cycles  liver disease  stroke  uterine fibroids  an unusual reaction to tamoxifen, other medicines, foods, dyes, or preservatives  pregnant or trying to get pregnant  breast-feeding How should I use this medicine? Take this medicine by mouth with a glass of water. Follow the directions on the prescription label. You can take it with or without food. Take your medicine at regular intervals. Do not take your medicine more often than directed. Do not stop taking except on your doctor's advice. A special MedGuide will be given to you by the pharmacist with each prescription and refill. Be sure to read this information carefully each time. Talk to your pediatrician regarding the use of this medicine in children. While this drug may be prescribed for selected conditions, precautions do apply. Overdosage: If you think you have taken too much of this medicine contact a poison control center or emergency room at once. NOTE: This medicine is only for you. Do not share this medicine with others. What if I miss a dose? If you miss a dose, take it as soon as you can. If it is almost time for your next dose, take only that dose. Do  not take double or extra doses. What may interact with this medicine? Do not take this medicine with any of the following medications:  cisapride  certain medicines for irregular heart beat like dronedarone, quinidine  certain medicines for fungal infection like fluconazole, posaconazole  pimozide  saquinavir  thioridazine This medicine may also interact with the following medications:  aminoglutethimide  anastrozole  bromocriptine  chemotherapy drugs  dofetilide  female hormones, like estrogens and birth control pills  letrozole  medroxyprogesterone  phenobarbital  rifampin  warfarin This list may not describe all possible interactions. Give your health care provider a list of all the medicines, herbs, non-prescription drugs, or dietary supplements you use. Also tell them if you smoke, drink alcohol, or use illegal drugs. Some items may interact with your medicine. What should I watch for while using this medicine? Visit your doctor or health care professional for regular checks on your progress. You will need regular pelvic exams, breast exams, and mammograms. If you are taking this medicine to reduce your risk of getting breast cancer, you should know that this medicine does not prevent all types of breast cancer. If breast cancer or other problems occur, there is no guarantee that it will be found at an early stage. Do not become pregnant while taking this medicine or for 2 months after stopping it. Women should inform their doctor if they wish to become pregnant or think they might be pregnant. There is a potential for serious side effects to an unborn child. Talk to your  health care professional or pharmacist for more information. Do not breast-feed an infant while taking this medicine or for 3 months after stopping it. This medicine may interfere with the ability to have a child. Talk with your doctor or health care professional if you are concerned about your  fertility. What side effects may I notice from receiving this medicine? Side effects that you should report to your doctor or health care professional as soon as possible:  allergic reactions like skin rash, itching or hives, swelling of the face, lips, or tongue  changes in vision  changes in your menstrual cycle  difficulty walking or talking  new breast lumps  numbness  pelvic pain or pressure  redness, blistering, peeling or loosening of the skin, including inside the mouth  signs and symptoms of a dangerous change in heartbeat or heart rhythm like chest pain, dizziness, fast or irregular heartbeat, palpitations, feeling faint or lightheaded, falls, breathing problems  sudden chest pain  swelling, pain or tenderness in your calf or leg  unusual bruising or bleeding  vaginal discharge that is bloody, brown, or rust  weakness  yellowing of the whites of the eyes or skin Side effects that usually do not require medical attention (report to your doctor or health care professional if they continue or are bothersome):  fatigue  hair loss, although uncommon and is usually mild  headache  hot flashes  impotence (in men)  nausea, vomiting (mild)  vaginal discharge (white or clear) This list may not describe all possible side effects. Call your doctor for medical advice about side effects. You may report side effects to FDA at 1-800-FDA-1088. Where should I keep my medicine? Keep out of the reach of children. Store at room temperature between 20 and 25 degrees C (68 and 77 degrees F). Protect from light. Keep container tightly closed. Throw away any unused medicine after the expiration date. NOTE: This sheet is a summary. It may not cover all possible information. If you have questions about this medicine, talk to your doctor, pharmacist, or health care provider.  2020 Elsevier/Gold Standard (2018-05-18 11:15:31) Hysterectomy Information  A hysterectomy is a surgery  in which the uterus is removed. The fallopian tubes and ovaries may be removed (bilateral salpingo-oophorectomy) as well. This procedure may be done to treat various medical problems. After the procedure, a woman will no longer have menstrual periods nor will she be able to become pregnant (sterile). What are the reasons for a hysterectomy? There are many reasons why a woman might have this procedure. They include:  Persistent, abnormal vaginal bleeding.  Long-term (chronic) pelvic pain or infection.  Endometriosis. This is when the lining of the uterus (endometrium) starts to grow outside the uterus.  Adenomyosis. This is when the endometrium starts to grow in the muscle of the uterus.  Pelvic organ prolapse. This is a condition in which the uterus falls down into the vagina.  Noncancerous growths in the uterus (uterine fibroids) that cause symptoms.  The presence of precancerous cells.  Cervical or uterine cancer. What are the different types of hysterectomy? There are three different types of hysterectomy:  Supracervical hysterectomy. In this type, the top part of the uterus is removed, but not the cervix.  Total hysterectomy. In this type, the uterus and cervix are removed.  Radical hysterectomy. In this type, the uterus, the cervix, and the tissue that holds the uterus in place (parametrium) are removed. What are the different ways a hysterectomy can be performed? There are  many different ways a hysterectomy can be performed, including:  Abdominal hysterectomy. In this type, an incision is made in the abdomen. The uterus is removed through this incision.  Vaginal hysterectomy. In this type, an incision is made in the vagina. The uterus is removed through this incision. There are no abdominal incisions.  Conventional laparoscopic hysterectomy. In this type, three or four small incisions are made in the abdomen. A thin, lighted tube with a camera (laparoscope) is inserted into  one of the incisions. Other tools are put through the other incisions. The uterus is cut into small pieces. The small pieces are removed through the incisions or through the vagina.  Laparoscopically assisted vaginal hysterectomy (LAVH). In this type, three or four small incisions are made in the abdomen. Part of the surgery is performed laparoscopically and the other part is done vaginally. The uterus is removed through the vagina.  Robot-assisted laparoscopic hysterectomy. In this type, a laparoscope and other tools are inserted into three or four small incisions in the abdomen. A computer-controlled device is used to give the surgeon a 3D image and to help control the surgical instruments. This allows for more precise movements of surgical instruments. The uterus is cut into small pieces and removed through the incisions or removed through the vagina. Discuss the options with your health care provider to determine which type is the right one for you. What are the risks? Generally, this is a safe procedure. However, problems may occur, including:  Bleeding and risk of blood transfusion. Tell your health care provider if you do not want to receive any blood products.  Blood clots in the legs or lung.  Infection.  Damage to other structures or organs.  Allergic reactions to medicines.  Changing to an abdominal hysterectomy from one of the other techniques. What to expect after a hysterectomy  You will be given pain medicine.  You may need to stay in the hospital for 1- 2 days to recover, depending on the type of hysterectomy you had.  Follow your health care provider's instructions about exercise, driving, and general activities. Ask your health care provider what activities are safe for you.  You will need to have someone with you for the first 3-5 days after you go home.  You will need to follow up with your surgeon in 2-4 weeks after surgery to evaluate your progress.  If the  ovaries are removed, you will have early menopause symptoms such as hot flashes, night sweats, and insomnia.  If you had a hysterectomy for a problem that was not cancer or not a condition that could lead to cancer, then you no longer need Pap tests. However, even if you no longer need a Pap test, a regular pelvic exam is a good idea to make sure no other problems are developing. Questions to ask your health care provider  Is a hysterectomy medically necessary? Do I have other treatment options for my condition?  What are my options for hysterectomy procedure?  What organs and tissues need to be removed?  What are the risks?  What are the benefits?  How long will I need to stay in the hospital after the procedure?  How long will I need to recover at home?  What symptoms can I expect after the procedure? Summary  A hysterectomy is a surgery in which the uterus is removed. The fallopian tubes and ovaries may be removed (bilateral salpingo-oophorectomy) as well.  This procedure may be done to treat various  medical problems. After the procedure, a woman will no longer have menstrual periods nor will she be able to become pregnant.  Discuss the options with your health care provider to determine which type of hysterectomy is the right one for you. This information is not intended to replace advice given to you by your health care provider. Make sure you discuss any questions you have with your health care provider. Document Released: 11/19/2000 Document Revised: 05/08/2017 Document Reviewed: 07/02/2016 Elsevier Patient Education  2020 [employer].

## 2019-04-12 ENCOUNTER — Institutional Professional Consult (permissible substitution): Payer: No Typology Code available for payment source | Admitting: Plastic Surgery

## 2019-04-22 ENCOUNTER — Institutional Professional Consult (permissible substitution): Payer: No Typology Code available for payment source | Admitting: Plastic Surgery

## 2019-05-12 ENCOUNTER — Ambulatory Visit (INDEPENDENT_AMBULATORY_CARE_PROVIDER_SITE_OTHER): Payer: No Typology Code available for payment source | Admitting: Obstetrics and Gynecology

## 2019-05-12 ENCOUNTER — Encounter: Payer: Self-pay | Admitting: Obstetrics and Gynecology

## 2019-05-12 ENCOUNTER — Other Ambulatory Visit: Payer: Self-pay

## 2019-05-12 VITALS — BP 168/100 | HR 82 | Ht 61.0 in | Wt 219.0 lb

## 2019-05-12 DIAGNOSIS — N950 Postmenopausal bleeding: Secondary | ICD-10-CM

## 2019-05-12 DIAGNOSIS — Z1501 Genetic susceptibility to malignant neoplasm of breast: Secondary | ICD-10-CM | POA: Diagnosis not present

## 2019-05-12 DIAGNOSIS — Z1509 Genetic susceptibility to other malignant neoplasm: Secondary | ICD-10-CM

## 2019-05-12 NOTE — H&P (View-Only) (Signed)
 Patient ID: [name], female   DOB: 12/25/1960, 58 y.o.   MRN: [id number]  Reason for Consult: Pre-op Exam   Referred by Berglund, Laura H, MD  Subjective:     HPI:  [name] is a 58 y.o. female. She is here for a preoperative visit. She is scheduled for a total laparoscopic hysterectomy, bilateral salpingo-oophorectomy and cystoscopy next week. She has been well since I last saw her. Unfortunately her father-in-law recently passed away ans she has been balancing a lot of home demands.   Her surgery has been planned because of a recent BRCA 2 diagnosis as well as post menopausal bleeding. Her recent D&C showed normal endometrial pathology but given this new BRCA2 diagnosis she would like to proceed with a prophylactic total laparoscopic hysterectomy, bilateral salpingo-oophorectomy.    She is a [employer]'s [employer] and does not desire a blood transfusion. She has brought a copy of the blood products that she would be okay accepting.       Past Medical History:  Diagnosis Date  . Allergic rhinitis   . Arthritis   . Asthma   . Basal cell carcinoma   . BRCA2 gene mutation positive 12/2018   MyRisk BRCA 2 positive with special interpretation--mutation has less penetrance than usual BRCA 2 mutation  . Bursitis   . Complication of anesthesia    thrashing and screaming  . Dermatitis, eczematoid 09/30/2011  . Duodenal ulcer   . Family history of breast cancer   . Fibromyalgia   . Gastritis   . GERD (gastroesophageal reflux disease)   . Heart murmur    asymptomatic  . Hiatal hernia   . Hypertension   . Hypothyroidism   . IBS (irritable bowel syndrome)   . OSA (obstructive sleep apnea)    has CPAP  . Psoriasis   . Rheumatic fever    Family History  Problem Relation Age of Onset  . Breast cancer Mother 56  . CAD Father   . Heart attack Father   . Bladder Cancer Father 65  . Breast cancer Paternal Grandmother 60  . Leukemia Paternal Grandmother 76  . Diabetes  Other        multiple family members  . Breast cancer Maternal Aunt 32   Past Surgical History:  Procedure Laterality Date  . APPENDECTOMY    . BREAST BIOPSY Left 2007   benign, dr Byrnett  . CHOLECYSTECTOMY    . HYSTEROSCOPY W/D&C N/A 12/14/2018   Procedure: DILATATION AND CURETTAGE /HYSTEROSCOPY WITH MYOSURE, VULVAR BIOPSY;  Surgeon: Salsabeel Gorelick R, MD;  Location: [facility] ORS;  Service: Gynecology;  Laterality: N/A;  . SKIN BIOPSY Left    Back on L leg   . SKIN CANCER EXCISION     BCCA of face  . TONSILECTOMY, ADENOIDECTOMY, BILATERAL MYRINGOTOMY AND TUBES      Short Social History:  Social History   Tobacco Use  . Smoking status: Never Smoker  . Smokeless tobacco: Never Used  Substance Use Topics  . Alcohol use: Yes    Alcohol/week: 2.0 standard drinks    Types: 2 Standard drinks or equivalent per week    Comment: rarely    Allergies  Allergen Reactions  . Sulfa Antibiotics Other (See Comments)    seizure  . Amlodipine Swelling    Leg swelling  . Furosemide Swelling  . Lyrica [Pregabalin] Other (See Comments)    Dizziness and blurred vision  . Hydrochlorothiazide Itching and Rash    Current Outpatient Medications    Medication Sig Dispense Refill  . acetaminophen (TYLENOL) 500 MG tablet Take 2 tablets (1,000 mg total) by mouth every 6 (six) hours as needed. 60 tablet 1  . albuterol (PROVENTIL HFA;VENTOLIN HFA) 108 (90 BASE) MCG/ACT inhaler Inhale 2 puffs into the lungs 4 (four) times daily as needed. 18 g 12  . aspirin 81 MG EC tablet Take 81 mg by mouth at bedtime.     Marland Kitchen BYSTOLIC 10 MG tablet TAKE 1 TABLET BY MOUTH DAILY (Patient taking differently: Take 10 mg by mouth daily. ) 90 tablet 1  . cetirizine (ZYRTEC) 10 MG tablet Take 10 mg by mouth daily.    . cholestyramine light (PREVALITE) 4 g packet MIX 1 PACKET AS DIRECTED AND DRINK ONCE DAILY (Patient taking differently: Take 4 g by mouth daily. ) 90 packet 1  . clobetasol ointment (TEMOVATE) 0.05 % Apply to  affected area every night for 4 weeks, then every other day for 4 weeks and then twice a week for 4 weeks or until resolution. 30 g 5  . cyclobenzaprine (FLEXERIL) 5 MG tablet TAKE 3 TABLETS BY MOUTH TWO TIMES DAILY (Patient taking differently: Take 15 mg by mouth 2 (two) times daily. ) 540 tablet 0  . diphenhydrAMINE (BENADRYL) 25 MG tablet Take 25-50 mg by mouth every 6 (six) hours as needed (allergies/asthma).    . doxazosin (CARDURA) 2 MG tablet TAKE 1 TABLET (2 MG TOTAL) BY MOUTH DAILY. (Patient taking differently: Take 2 mg by mouth at bedtime. ) 90 tablet 3  . doxepin (SINEQUAN) 100 MG capsule TAKE 1 CAPSULE BY MOUTH ONCE DAILY AT BEDTIME (Patient taking differently: Take 100 mg by mouth at bedtime. ) 90 capsule 3  . esomeprazole (NEXIUM) 40 MG capsule TAKE 1 CAPSULE (40 MG TOTAL) BY MOUTH AT BEDTIME. (Patient taking differently: Take 40 mg by mouth daily before supper. (1700)) 90 capsule 3  . ibuprofen (ADVIL) 800 MG tablet Take 1 tablet (800 mg total) by mouth every 8 (eight) hours as needed for cramping. 30 tablet 1  . levothyroxine (SYNTHROID) 200 MCG tablet TAKE 1 TABLET (200 MCG TOTAL) BY MOUTH DAILY. 90 tablet 3  . lisinopril (ZESTRIL) 40 MG tablet TAKE 1 TABLET BY MOUTH DAILY (Patient taking differently: Take 40 mg by mouth daily. ) 90 tablet 0  . loperamide (IMODIUM A-D) 2 MG tablet Take 2-4 mg by mouth 4 (four) times daily as needed for diarrhea or loose stools.     . meloxicam (MOBIC) 15 MG tablet TAKE 1 TABLET (15 MG TOTAL) BY MOUTH DAILY. 90 tablet 3  . montelukast (SINGULAIR) 10 MG tablet TAKE 1 TABLET BY MOUTH NIGHTLY AT BEDTIME (Patient taking differently: Take 10 mg by mouth at bedtime. ) 90 tablet 3  . Multiple Vitamin (MULTIVITAMIN WITH MINERALS) TABS tablet Take 1 tablet by mouth daily.    Vladimir Faster Glycol-Propyl Glycol (LUBRICANT EYE DROPS) 0.4-0.3 % SOLN Place 1 drop into both eyes 3 (three) times daily as needed (allergy eyes.).    Marland Kitchen tamoxifen (NOLVADEX) 20 MG tablet  Take 1 tablet (20 mg total) by mouth daily. 30 tablet 11  . traMADol (ULTRAM) 50 MG tablet TAKE 1 TABLET BY MOUTH EVERY 12 HOURS AS NEEDED (Patient taking differently: Take 50 mg by mouth every 12 (twelve) hours as needed for moderate pain. ) 60 tablet 0  . traZODone (DESYREL) 50 MG tablet Take 1-2 tablets (50-100 mg total) by mouth at bedtime as needed for sleep. 180 tablet 1  . vitamin B-12 (CYANOCOBALAMIN)  1000 MCG tablet Take 1,000 mcg by mouth daily.     No current facility-administered medications for this visit.     Review of Systems  Constitutional: Negative for chills, fatigue, fever and unexpected weight change.  HENT: Negative for trouble swallowing.  Eyes: Negative for loss of vision.  Respiratory: Negative for cough, shortness of breath and wheezing.  Cardiovascular: Negative for chest pain, leg swelling, palpitations and syncope.  GI: Negative for abdominal pain, blood in stool, diarrhea, nausea and vomiting.  GU: Negative for difficulty urinating, dysuria, frequency and hematuria.  Musculoskeletal: Negative for back pain, leg pain and joint pain.  Skin: Negative for rash.  Neurological: Negative for dizziness, headaches, light-headedness, numbness and seizures.  Psychiatric: Negative for behavioral problem, confusion, depressed mood and sleep disturbance.        Objective:  Objective   Vitals:   05/12/19 1356  BP: (!) 168/100  Pulse: 82  Weight: 219 lb (99.3 kg)  Height: 5' 1" (1.549 m)   Body mass index is 41.38 kg/m.  Physical Exam Vitals signs and nursing note reviewed.  Constitutional:      Appearance: She is well-developed.  HENT:     Head: Normocephalic and atraumatic.  Eyes:     Pupils: Pupils are equal, round, and reactive to light.  Cardiovascular:     Rate and Rhythm: Normal rate and regular rhythm.  Pulmonary:     Effort: Pulmonary effort is normal. No respiratory distress.  Skin:    General: Skin is warm and dry.  Neurological:     Mental  Status: She is alert and oriented to person, place, and time.  Psychiatric:        Behavior: Behavior normal.        Thought Content: Thought content normal.        Judgment: Judgment normal.         Assessment/Plan:     58 yo with BRCA 2 mutation and postmenopausal bleeding  Will proceed with planned total hysterectomy and bilateral salpingo-oophorectomy and cystoscopy. Given her BRCA 2 mutation and postmenopausal bleeding the hysterectomy is reasonable because there have been serous or serous-like endometrial cancer associated with the BRCA 2 mutation.   I have had a careful discussion with this patient about all the options available and the risk/benefits of each. I have fully informed this patient that a hysterectomy may subject her to a variety of discomforts and risks: She understands that most patients have surgery with little difficulty, but problems can happen ranging from minor to fatal. These include nausea, vomiting, pain, bleeding, infection, poor healing, hernia, wound separation, vaginal cuff separation or formation of adhesions. Unexpected reactions may occur from any drug or anesthetic given. Unintended injury may occur to other pelvic or abdominal structures such as Fallopian tubes, ovaries, bladder, ureter (tube from kidney to bladder), or bowel. Nerves going from the pelvis to the legs may be injured. Any such injury may require immediate or later additional surgery to correct the problem. Excessive blood loss requiring transfusion is very unlikely but possible. Dangerous blood clots may form in the legs or lungs. Physical and sexual activity will be restricted in varying degrees for an indeterminate period of time but most often 2-4 weeks. She understands that the plan is to do this laparoscopically, however, there is a chance that this will need to be performed via a larger incision. She may be hospitalized overnight. Finally, she understands that it is impossible to list every  possible undesirable effect and that   the condition for which surgery is done is not always cured or significantly improved, and in rare cases may be even worse. I have also counseled her extensively about the pros and cons of ovarian conservation versus removal. Ample time was given to answer all questions.    More than 25 minutes were spent face to face with the patient in the room with more than 50% of the time spent providing counseling and discussing the plan of management.    Lanetra Hartley MD [facility], [facility] 05/12/2019 9:35 PM       

## 2019-05-12 NOTE — Progress Notes (Signed)
Patient ID: [name], female   DOB: 11-Jan-1961, 58 y.o.   MRN: [id number]  Reason for Consult: Pre-op Exam   Referred by Glean Hess, MD  Subjective:     HPI:  [name] is a 58 y.o. female. She is here for a preoperative visit. She is scheduled for a total laparoscopic hysterectomy, bilateral salpingo-oophorectomy and cystoscopy next week. She has been well since I last saw her. Unfortunately her father-in-law recently passed away ans she has been balancing a lot of home demands.   Her surgery has been planned because of a recent BRCA 2 diagnosis as well as post menopausal bleeding. Her recent D&C showed normal endometrial pathology but given this new BRCA2 diagnosis she would like to proceed with a prophylactic total laparoscopic hysterectomy, bilateral salpingo-oophorectomy.    She is a Sales promotion account executive [employer] and does not desire a blood transfusion. She has brought a copy of the blood products that she would be okay accepting.       Past Medical History:  Diagnosis Date  . Allergic rhinitis   . Arthritis   . Asthma   . Basal cell carcinoma   . BRCA2 gene mutation positive 12/2018   MyRisk BRCA 2 positive with special interpretation--mutation has less penetrance than usual BRCA 2 mutation  . Bursitis   . Complication of anesthesia    thrashing and screaming  . Dermatitis, eczematoid 09/30/2011  . Duodenal ulcer   . Family history of breast cancer   . Fibromyalgia   . Gastritis   . GERD (gastroesophageal reflux disease)   . Heart murmur    asymptomatic  . Hiatal hernia   . Hypertension   . Hypothyroidism   . IBS (irritable bowel syndrome)   . OSA (obstructive sleep apnea)    has CPAP  . Psoriasis   . Rheumatic fever    Family History  Problem Relation Age of Onset  . Breast cancer Mother 68  . CAD Father   . Heart attack Father   . Bladder Cancer Father 67  . Breast cancer Paternal Grandmother 55  . Leukemia Paternal Grandmother 62  . Diabetes  Other        multiple family members  . Breast cancer Maternal Aunt 32   Past Surgical History:  Procedure Laterality Date  . APPENDECTOMY    . BREAST BIOPSY Left 2007   benign, dr Bary Castilla  . CHOLECYSTECTOMY    . HYSTEROSCOPY W/D&C N/A 12/14/2018   Procedure: DILATATION AND CURETTAGE /HYSTEROSCOPY WITH MYOSURE, VULVAR BIOPSY;  Surgeon: Homero Fellers, MD;  Location: [facility] ORS;  Service: Gynecology;  Laterality: N/A;  . SKIN BIOPSY Left    Back on L leg   . SKIN CANCER EXCISION     BCCA of face  . TONSILECTOMY, ADENOIDECTOMY, BILATERAL MYRINGOTOMY AND TUBES      Short Social History:  Social History   Tobacco Use  . Smoking status: Never Smoker  . Smokeless tobacco: Never Used  Substance Use Topics  . Alcohol use: Yes    Alcohol/week: 2.0 standard drinks    Types: 2 Standard drinks or equivalent per week    Comment: rarely    Allergies  Allergen Reactions  . Sulfa Antibiotics Other (See Comments)    seizure  . Amlodipine Swelling    Leg swelling  . Furosemide Swelling  . Lyrica [Pregabalin] Other (See Comments)    Dizziness and blurred vision  . Hydrochlorothiazide Itching and Rash    Current Outpatient Medications  Medication Sig Dispense Refill  . acetaminophen (TYLENOL) 500 MG tablet Take 2 tablets (1,000 mg total) by mouth every 6 (six) hours as needed. 60 tablet 1  . albuterol (PROVENTIL HFA;VENTOLIN HFA) 108 (90 BASE) MCG/ACT inhaler Inhale 2 puffs into the lungs 4 (four) times daily as needed. 18 g 12  . aspirin 81 MG EC tablet Take 81 mg by mouth at bedtime.     Marland Kitchen BYSTOLIC 10 MG tablet TAKE 1 TABLET BY MOUTH DAILY (Patient taking differently: Take 10 mg by mouth daily. ) 90 tablet 1  . cetirizine (ZYRTEC) 10 MG tablet Take 10 mg by mouth daily.    . cholestyramine light (PREVALITE) 4 g packet MIX 1 PACKET AS DIRECTED AND DRINK ONCE DAILY (Patient taking differently: Take 4 g by mouth daily. ) 90 packet 1  . clobetasol ointment (TEMOVATE) 0.05 % Apply to  affected area every night for 4 weeks, then every other day for 4 weeks and then twice a week for 4 weeks or until resolution. 30 g 5  . cyclobenzaprine (FLEXERIL) 5 MG tablet TAKE 3 TABLETS BY MOUTH TWO TIMES DAILY (Patient taking differently: Take 15 mg by mouth 2 (two) times daily. ) 540 tablet 0  . diphenhydrAMINE (BENADRYL) 25 MG tablet Take 25-50 mg by mouth every 6 (six) hours as needed (allergies/asthma).    . doxazosin (CARDURA) 2 MG tablet TAKE 1 TABLET (2 MG TOTAL) BY MOUTH DAILY. (Patient taking differently: Take 2 mg by mouth at bedtime. ) 90 tablet 3  . doxepin (SINEQUAN) 100 MG capsule TAKE 1 CAPSULE BY MOUTH ONCE DAILY AT BEDTIME (Patient taking differently: Take 100 mg by mouth at bedtime. ) 90 capsule 3  . esomeprazole (NEXIUM) 40 MG capsule TAKE 1 CAPSULE (40 MG TOTAL) BY MOUTH AT BEDTIME. (Patient taking differently: Take 40 mg by mouth daily before supper. (1700)) 90 capsule 3  . ibuprofen (ADVIL) 800 MG tablet Take 1 tablet (800 mg total) by mouth every 8 (eight) hours as needed for cramping. 30 tablet 1  . levothyroxine (SYNTHROID) 200 MCG tablet TAKE 1 TABLET (200 MCG TOTAL) BY MOUTH DAILY. 90 tablet 3  . lisinopril (ZESTRIL) 40 MG tablet TAKE 1 TABLET BY MOUTH DAILY (Patient taking differently: Take 40 mg by mouth daily. ) 90 tablet 0  . loperamide (IMODIUM A-D) 2 MG tablet Take 2-4 mg by mouth 4 (four) times daily as needed for diarrhea or loose stools.     . meloxicam (MOBIC) 15 MG tablet TAKE 1 TABLET (15 MG TOTAL) BY MOUTH DAILY. 90 tablet 3  . montelukast (SINGULAIR) 10 MG tablet TAKE 1 TABLET BY MOUTH NIGHTLY AT BEDTIME (Patient taking differently: Take 10 mg by mouth at bedtime. ) 90 tablet 3  . Multiple Vitamin (MULTIVITAMIN WITH MINERALS) TABS tablet Take 1 tablet by mouth daily.    Vladimir Faster Glycol-Propyl Glycol (LUBRICANT EYE DROPS) 0.4-0.3 % SOLN Place 1 drop into both eyes 3 (three) times daily as needed (allergy eyes.).    Marland Kitchen tamoxifen (NOLVADEX) 20 MG tablet  Take 1 tablet (20 mg total) by mouth daily. 30 tablet 11  . traMADol (ULTRAM) 50 MG tablet TAKE 1 TABLET BY MOUTH EVERY 12 HOURS AS NEEDED (Patient taking differently: Take 50 mg by mouth every 12 (twelve) hours as needed for moderate pain. ) 60 tablet 0  . traZODone (DESYREL) 50 MG tablet Take 1-2 tablets (50-100 mg total) by mouth at bedtime as needed for sleep. 180 tablet 1  . vitamin B-12 (CYANOCOBALAMIN)  1000 MCG tablet Take 1,000 mcg by mouth daily.     No current facility-administered medications for this visit.     Review of Systems  Constitutional: Negative for chills, fatigue, fever and unexpected weight change.  HENT: Negative for trouble swallowing.  Eyes: Negative for loss of vision.  Respiratory: Negative for cough, shortness of breath and wheezing.  Cardiovascular: Negative for chest pain, leg swelling, palpitations and syncope.  GI: Negative for abdominal pain, blood in stool, diarrhea, nausea and vomiting.  GU: Negative for difficulty urinating, dysuria, frequency and hematuria.  Musculoskeletal: Negative for back pain, leg pain and joint pain.  Skin: Negative for rash.  Neurological: Negative for dizziness, headaches, light-headedness, numbness and seizures.  Psychiatric: Negative for behavioral problem, confusion, depressed mood and sleep disturbance.        Objective:  Objective   Vitals:   05/12/19 1356  BP: (!) 168/100  Pulse: 82  Weight: 219 lb (99.3 kg)  Height: 5' 1" (1.549 m)   Body mass index is 41.38 kg/m.  Physical Exam Vitals signs and nursing note reviewed.  Constitutional:      Appearance: She is well-developed.  HENT:     Head: Normocephalic and atraumatic.  Eyes:     Pupils: Pupils are equal, round, and reactive to light.  Cardiovascular:     Rate and Rhythm: Normal rate and regular rhythm.  Pulmonary:     Effort: Pulmonary effort is normal. No respiratory distress.  Skin:    General: Skin is warm and dry.  Neurological:     Mental  Status: She is alert and oriented to person, place, and time.  Psychiatric:        Behavior: Behavior normal.        Thought Content: Thought content normal.        Judgment: Judgment normal.         Assessment/Plan:     58 yo with BRCA 2 mutation and postmenopausal bleeding  Will proceed with planned total hysterectomy and bilateral salpingo-oophorectomy and cystoscopy. Given her BRCA 2 mutation and postmenopausal bleeding the hysterectomy is reasonable because there have been serous or serous-like endometrial cancer associated with the BRCA 2 mutation.   I have had a careful discussion with this patient about all the options available and the risk/benefits of each. I have fully informed this patient that a hysterectomy may subject her to a variety of discomforts and risks: She understands that most patients have surgery with little difficulty, but problems can happen ranging from minor to fatal. These include nausea, vomiting, pain, bleeding, infection, poor healing, hernia, wound separation, vaginal cuff separation or formation of adhesions. Unexpected reactions may occur from any drug or anesthetic given. Unintended injury may occur to other pelvic or abdominal structures such as Fallopian tubes, ovaries, bladder, ureter (tube from kidney to bladder), or bowel. Nerves going from the pelvis to the legs may be injured. Any such injury may require immediate or later additional surgery to correct the problem. Excessive blood loss requiring transfusion is very unlikely but possible. Dangerous blood clots may form in the legs or lungs. Physical and sexual activity will be restricted in varying degrees for an indeterminate period of time but most often 2-4 weeks. She understands that the plan is to do this laparoscopically, however, there is a chance that this will need to be performed via a larger incision. She may be hospitalized overnight. Finally, she understands that it is impossible to list every  possible undesirable effect and that  the condition for which surgery is done is not always cured or significantly improved, and in rare cases may be even worse. I have also counseled her extensively about the pros and cons of ovarian conservation versus removal. Ample time was given to answer all questions.    More than 25 minutes were spent face to face with the patient in the room with more than 50% of the time spent providing counseling and discussing the plan of management.    Adrian Prows MD [facility], [facility] 05/12/2019 9:35 PM

## 2019-05-13 ENCOUNTER — Ambulatory Visit (INDEPENDENT_AMBULATORY_CARE_PROVIDER_SITE_OTHER): Payer: No Typology Code available for payment source | Admitting: Internal Medicine

## 2019-05-13 ENCOUNTER — Encounter
Admission: RE | Admit: 2019-05-13 | Discharge: 2019-05-13 | Disposition: A | Discharge status: Home / Self Care | Payer: No Typology Code available for payment source | Source: Ambulatory Visit | Attending: Obstetrics and Gynecology | Admitting: Obstetrics and Gynecology

## 2019-05-13 ENCOUNTER — Encounter: Payer: Self-pay | Admitting: Internal Medicine

## 2019-05-13 VITALS — BP 128/80 | HR 72 | Ht 61.0 in | Wt 213.0 lb

## 2019-05-13 DIAGNOSIS — Z01812 Encounter for preprocedural laboratory examination: Secondary | ICD-10-CM | POA: Diagnosis not present

## 2019-05-13 DIAGNOSIS — R7303 Prediabetes: Secondary | ICD-10-CM

## 2019-05-13 DIAGNOSIS — I10 Essential (primary) hypertension: Secondary | ICD-10-CM | POA: Diagnosis not present

## 2019-05-13 DIAGNOSIS — Z23 Encounter for immunization: Secondary | ICD-10-CM | POA: Diagnosis not present

## 2019-05-13 HISTORY — DX: Anemia, unspecified: D64.9

## 2019-05-13 HISTORY — DX: Headache, unspecified: R51.9

## 2019-05-13 HISTORY — DX: Personal history of urinary calculi: Z87.442

## 2019-05-13 LAB — BASIC METABOLIC PANEL
Anion gap: 11 (ref 5–15)
BUN: 15 mg/dL (ref 6–20)
CO2: 28 mmol/L (ref 22–32)
Calcium: 9 mg/dL (ref 8.9–10.3)
Chloride: 102 mmol/L (ref 98–111)
Creatinine, Ser: 0.53 mg/dL (ref 0.44–1.00)
GFR calc Af Amer: >60 mL/min (ref >60)
GFR calc non Af Amer: >60 mL/min (ref >60)
Glucose, Bld: 112 mg/dL — ABNORMAL HIGH (ref 70–99)
Potassium: 4 mmol/L (ref 3.5–5.1)
Sodium: 141 mmol/L (ref 135–145)

## 2019-05-13 LAB — CBC
HCT: 39.3 % (ref 36.0–46.0)
Hemoglobin: 13.2 g/dL (ref 12.0–15.0)
MCH: 31.4 pg (ref 26.0–34.0)
MCHC: 33.6 g/dL (ref 30.0–36.0)
MCV: 93.3 fL (ref 80.0–100.0)
Platelets: 319 10*3/uL (ref 150–400)
RBC: 4.21 MIL/uL (ref 3.87–5.11)
RDW: 12.3 % (ref 11.5–15.5)
WBC: 11.1 10*3/uL — ABNORMAL HIGH (ref 4.0–10.5)
nRBC: 0 % (ref 0.0–0.2)

## 2019-05-13 NOTE — Progress Notes (Signed)
Date:  05/13/2019   Name:  [name]   DOB:  1960/09/17   MRN:  [id number]   Chief Complaint: Diabetes (Wants shingles vaccine series and flu shot.) and Hypertension She is having a total hysterectomy next week for abnormal bleeding.  Her biopsy was negative but she is BRCA2 positive. She will consider prophy mastectomy in the future.  Diabetes She presents for her follow-up diabetic visit. Diabetes type: pre-diabetes. Her disease course has been stable. Pertinent negatives for hypoglycemia include no headaches or tremors. Pertinent negatives for diabetes include no chest pain, no fatigue, no polydipsia and no polyuria. Symptoms are stable. Current diabetic treatment includes diet. Her weight is stable.  Hypertension This is a chronic problem. The problem is controlled. Pertinent negatives include no chest pain, headaches, palpitations or shortness of breath. Past treatments include beta blockers. The current treatment provides significant improvement. There are no compliance problems.     Lab Results  Component Value Date   CREATININE 0.53 05/13/2019   BUN 15 05/13/2019   NA 141 05/13/2019   K 4.0 05/13/2019   CL 102 05/13/2019   CO2 28 05/13/2019   Lab Results  Component Value Date   CHOL 200 (H) 11/18/2018   HDL 49 11/18/2018   LDLCALC 114 (H) 11/18/2018   TRIG 183 (H) 11/18/2018   CHOLHDL 4.1 11/18/2018   Lab Results  Component Value Date   TSH 3.530 11/18/2018   Lab Results  Component Value Date   HGBA1C 6.2 (H) 11/18/2018     Review of Systems  Constitutional: Negative for appetite change, fatigue, fever and unexpected weight change.  HENT: Negative for tinnitus and trouble swallowing.   Eyes: Negative for visual disturbance.  Respiratory: Negative for cough, chest tightness and shortness of breath.   Cardiovascular: Negative for chest pain, palpitations and leg swelling.  Gastrointestinal: Negative for abdominal pain.  Endocrine: Negative for  polydipsia and polyuria.  Genitourinary: Positive for vaginal bleeding. Negative for dysuria and hematuria.  Musculoskeletal: Negative for arthralgias.  Neurological: Negative for tremors, numbness and headaches.  Psychiatric/Behavioral: Negative for dysphoric mood.    Patient Active Problem List   Diagnosis Date Noted  . BRCA2 gene mutation positive 03/03/2019  . Pre-diabetes 09/16/2016  . Insomnia 09/16/2016  . Arthritis of carpometacarpal (CMC) joint of thumb 05/29/2016  . Venous insufficiency of both lower extremities 01/17/2016  . Acquired hypothyroidism 11/10/2014  . Essential (primary) hypertension 11/10/2014  . Fibromyalgia syndrome 11/10/2014  . Mixed hyperlipidemia 11/10/2014  . Irritable bowel syndrome with diarrhea 11/10/2014  . Asthma, mild intermittent 11/10/2014  . BMI 40.0-44.9, adult ([place]) 11/10/2014  . Chronic peptic ulcer 11/10/2014  . Psoriasis 11/10/2014  . Obstructive apnea 11/10/2014  . Chest pain 07/19/2012  . Environmental and seasonal allergies 09/08/2011    Allergies  Allergen Reactions  . Sulfa Antibiotics Other (See Comments)    seizure  . Amlodipine Swelling    Leg swelling  . Furosemide Swelling  . Lyrica [Pregabalin] Other (See Comments)    Dizziness and blurred vision  . Hydrochlorothiazide Itching and Rash    Past Surgical History:  Procedure Laterality Date  . APPENDECTOMY    . BREAST BIOPSY Left 2007   benign, dr Bary Castilla  . CHOLECYSTECTOMY    . HYSTEROSCOPY W/D&C N/A 12/14/2018   Procedure: DILATATION AND CURETTAGE /HYSTEROSCOPY WITH MYOSURE, VULVAR BIOPSY;  Surgeon: Homero Fellers, MD;  Location: [facility] ORS;  Service: Gynecology;  Laterality: N/A;  . SKIN BIOPSY Left    Back  on L leg   . SKIN CANCER EXCISION     BCCA of face  . TONSILLECTOMY AND ADENOIDECTOMY      Social History   Tobacco Use  . Smoking status: Never Smoker  . Smokeless tobacco: Never Used  Substance Use Topics  . Alcohol use: Yes    Alcohol/week:  2.0 standard drinks    Types: 2 Standard drinks or equivalent per week    Comment: rarely  . Drug use: No     Medication list has been reviewed and updated.  Current Meds  Medication Sig  . acetaminophen (TYLENOL) 500 MG tablet Take 2 tablets (1,000 mg total) by mouth every 6 (six) hours as needed.  Marland Kitchen albuterol (PROVENTIL HFA;VENTOLIN HFA) 108 (90 BASE) MCG/ACT inhaler Inhale 2 puffs into the lungs 4 (four) times daily as needed.  Marland Kitchen aspirin 81 MG EC tablet Take 81 mg by mouth at bedtime.   Marland Kitchen BYSTOLIC 10 MG tablet TAKE 1 TABLET BY MOUTH DAILY (Patient taking differently: Take 10 mg by mouth every evening. )  . cetirizine (ZYRTEC) 10 MG tablet Take 10 mg by mouth every morning.   . cholestyramine light (PREVALITE) 4 g packet MIX 1 PACKET AS DIRECTED AND DRINK ONCE DAILY (Patient taking differently: Take 4 g by mouth daily. )  . clobetasol ointment (TEMOVATE) 0.05 % Apply to affected area every night for 4 weeks, then every other day for 4 weeks and then twice a week for 4 weeks or until resolution. (Patient taking differently: Apply 1 application topically as needed. Apply to affected area every night for 4 weeks, then every other day for 4 weeks and then twice a week for 4 weeks or until resolution.)  . cyclobenzaprine (FLEXERIL) 5 MG tablet TAKE 3 TABLETS BY MOUTH TWO TIMES DAILY (Patient taking differently: Take 15 mg by mouth 2 (two) times daily. )  . diphenhydrAMINE (BENADRYL) 25 MG tablet Take 25-50 mg by mouth every 6 (six) hours as needed (allergies/asthma).  . doxazosin (CARDURA) 2 MG tablet TAKE 1 TABLET (2 MG TOTAL) BY MOUTH DAILY. (Patient taking differently: Take 2 mg by mouth at bedtime. )  . doxepin (SINEQUAN) 100 MG capsule TAKE 1 CAPSULE BY MOUTH ONCE DAILY AT BEDTIME (Patient taking differently: Take 100 mg by mouth at bedtime. )  . esomeprazole (NEXIUM) 40 MG capsule TAKE 1 CAPSULE (40 MG TOTAL) BY MOUTH AT BEDTIME. (Patient taking differently: Take 40 mg by mouth daily before  supper. (1700))  . ibuprofen (ADVIL) 800 MG tablet Take 1 tablet (800 mg total) by mouth every 8 (eight) hours as needed for cramping.  Marland Kitchen levothyroxine (SYNTHROID) 200 MCG tablet TAKE 1 TABLET (200 MCG TOTAL) BY MOUTH DAILY. (Patient taking differently: Take 200 mcg by mouth daily before breakfast. )  . lisinopril (ZESTRIL) 40 MG tablet TAKE 1 TABLET BY MOUTH DAILY (Patient taking differently: Take 40 mg by mouth every morning. )  . loperamide (IMODIUM A-D) 2 MG tablet Take 2-4 mg by mouth 4 (four) times daily as needed for diarrhea or loose stools.   . meloxicam (MOBIC) 15 MG tablet TAKE 1 TABLET (15 MG TOTAL) BY MOUTH DAILY.  . montelukast (SINGULAIR) 10 MG tablet TAKE 1 TABLET BY MOUTH NIGHTLY AT BEDTIME (Patient taking differently: Take 10 mg by mouth at bedtime. )  . Multiple Vitamin (MULTIVITAMIN WITH MINERALS) TABS tablet Take 1 tablet by mouth daily.  . tamoxifen (NOLVADEX) 20 MG tablet Take 1 tablet (20 mg total) by mouth daily. (Patient taking differently:  Take 20 mg by mouth every morning. )  . traMADol (ULTRAM) 50 MG tablet TAKE 1 TABLET BY MOUTH EVERY 12 HOURS AS NEEDED (Patient taking differently: Take 50 mg by mouth every 12 (twelve) hours as needed for moderate pain. )  . traZODone (DESYREL) 50 MG tablet Take 1-2 tablets (50-100 mg total) by mouth at bedtime as needed for sleep.  . vitamin B-12 (CYANOCOBALAMIN) 1000 MCG tablet Take 1,000 mcg by mouth daily.    PHQ 2/9 Scores 05/13/2019 11/18/2018 07/19/2018 11/12/2017  PHQ - 2 Score 0 0 4 4  PHQ- 9 Score - '2 11 14    '$ BP Readings from Last 3 Encounters:  05/13/19 128/80  05/13/19 (!) 149/68  05/12/19 (!) 168/100    Physical Exam Vitals signs and nursing note reviewed.  Constitutional:      General: She is not in acute distress.    Appearance: She is well-developed.  HENT:     Head: Normocephalic and atraumatic.  Neck:     Musculoskeletal: Normal range of motion.  Cardiovascular:     Rate and Rhythm: Normal rate and  regular rhythm.     Pulses: Normal pulses.     Heart sounds: No murmur.  Pulmonary:     Effort: Pulmonary effort is normal. No respiratory distress.     Breath sounds: No wheezing or rhonchi.  Musculoskeletal:     Right lower leg: No edema.     Left lower leg: No edema.  Lymphadenopathy:     Cervical: No cervical adenopathy.  Skin:    General: Skin is warm and dry.     Capillary Refill: Capillary refill takes less than 2 seconds.     Findings: No rash.  Neurological:     General: No focal deficit present.     Mental Status: She is alert and oriented to person, place, and time.  Psychiatric:        Behavior: Behavior normal.        Thought Content: Thought content normal.     Wt Readings from Last 3 Encounters:  05/13/19 213 lb (96.6 kg)  05/13/19 219 lb 9.3 oz (99.6 kg)  05/12/19 219 lb (99.3 kg)    BP 128/80   Pulse 72   Ht '5\' 1"'$  (1.549 m)   Wt 213 lb (96.6 kg)   SpO2 97%   BMI 40.25 kg/m   Assessment and Plan: 1. Essential (primary) hypertension Clinically stable exam with well controlled BP.   Tolerating medications, bystolic and lisinopril, without side effects at this time. Pt to continue current regimen and low sodium diet; benefits of regular exercise as able discussed.  2. Pre-diabetes Continue to work on diet, weight control - Hemoglobin A1c  3. Need for immunization against influenza - Flu Vaccine QUAD 36+ mos IM  Pt can schedule a Shingrix vaccine in 4-6 weeks. Partially dictated using Editor, commissioning. Any errors are unintentional.  Halina Maidens, MD [facility]  05/13/2019

## 2019-05-13 NOTE — Patient Instructions (Addendum)
Your procedure is scheduled on: 05-19-19 THURSDAY Report to Same Day Surgery 2nd floor medical mall [facility] Entrance-take elevator on left to 2nd floor.  Check in with surgery information desk.) To find out your arrival time please call [phone] between 1PM - 3PM on 05-18-19 [facility]  Remember: Instructions that are not followed completely may result in serious medical risk, up to and including death, or upon the discretion of your surgeon and anesthesiologist your surgery may need to be rescheduled.    _x___ 1. Do not eat food after midnight the night before your procedure. NO GUM OR CANDY AFTER MIDNIGHT. You may drink clear liquids up to 2 hours before you are scheduled to arrive at the hospital for your procedure.  Do not drink clear liquids within 2 hours of your scheduled arrival to the hospital.  Clear liquids include  --Water or Apple juice without pulp  --Gatorade  --Black Coffee or Clear Tea (No milk, no creamers, do not add anything to the coffee or Tea   ____Ensure clear carbohydrate drink on the way to the hospital for bariatric patients  _X___Ensure clear carbohydrate drink 3 PRIOR TO ARRIVAL TIME TO HOSPITAL .     __x__ 2. No Alcohol for 24 hours before or after surgery.   __x__3. No Smoking or e-cigarettes for 24 prior to surgery.  Do not use any chewable tobacco products for at least 6 hour prior to surgery   ____  4. Bring all medications with you on the day of surgery if instructed.    __x__ 5. Notify your doctor if there is any change in your medical condition     (cold, fever, infections).    x___6. On the morning of surgery brush your teeth with toothpaste and water.  You may rinse your mouth with mouth wash if you wish.  Do not swallow any toothpaste or mouthwash.   Do not wear jewelry, make-up, hairpins, clips or nail polish.  Do not wear lotions, powders, or perfumes.  Do not shave 48 hours prior to surgery. Men may shave face and neck.  Do not  bring valuables to the hospital.    [facility] is not responsible for any belongings or valuables.               Contacts, dentures or bridgework may not be worn into surgery.  Leave your suitcase in the car. After surgery it may be brought to your room.  For patients admitted to the hospital, discharge time is determined by your  treatment team.  _  Patients discharged the day of surgery will not be allowed to drive home.  You will need someone to drive you home and stay with you the night of your procedure.    Please read over the following fact sheets that you were given:   [facility] Preparing for Surgery and or MRSA Information   _x___ TAKE THE FOLLOWING MEDICATION THE MORNING OF SURGERY WITH A SMALL SIP OF WATER. These include:  1. ZYRTEC (CETIRIZINE)  2. FLEXERIL (CYLCLOBENAZEPRINE)  3. LEVOTHYROXINE (SYNTHROID)  4. NEXIUM (ESOMEPRAZOLE)  5.  6.  ____Fleets enema or Magnesium Citrate as directed.   _x___ Use CHG Soap or sage wipes as directed on instruction sheet   _X___ Use inhalers on the day of surgery and bring to hospital day of surgery-USE YOUR ALBUTEROL INHALER THE MORNING OF SURGERY AND [place]  ____ Stop Metformin and Janumet 2 days prior to surgery.    ____  Take 1/2 of usual insulin dose the night before surgery and none on the morning surgery.   _x___ Follow recommendations from Cardiologist, Pulmonologist or PCP regarding stopping Aspirin, Coumadin, Plavix ,Eliquis, Effient, or Pradaxa, and Pletal-STOP ASPIRIN NOW   X____Stop Anti-inflammatories such as Advil, Aleve, Ibuprofen, Motrin, Naproxen,MELOXICAM (MOBIC) Naprosyn, Goodies powders or aspirin products NOW-OK to take Tylenol    ____ Stop supplements until after surgery.     ____ Bring C-Pap to the hospital.

## 2019-05-14 LAB — HEMOGLOBIN A1C
Est. average glucose Bld gHb Est-mCnc: 128 mg/dL
Hgb A1c MFr Bld: 6.1 % — ABNORMAL HIGH (ref 4.8–5.6)

## 2019-05-16 ENCOUNTER — Other Ambulatory Visit
Admission: RE | Admit: 2019-05-16 | Discharge: 2019-05-16 | Disposition: A | Discharge status: Home / Self Care | Payer: No Typology Code available for payment source | Source: Ambulatory Visit | Attending: Obstetrics and Gynecology | Admitting: Obstetrics and Gynecology

## 2019-05-16 ENCOUNTER — Other Ambulatory Visit: Payer: Self-pay

## 2019-05-16 DIAGNOSIS — Z01812 Encounter for preprocedural laboratory examination: Secondary | ICD-10-CM | POA: Insufficient documentation

## 2019-05-16 DIAGNOSIS — Z20828 Contact with and (suspected) exposure to other viral communicable diseases: Secondary | ICD-10-CM | POA: Diagnosis not present

## 2019-05-16 LAB — SARS CORONAVIRUS 2 (TAT 6-24 HRS): SARS Coronavirus 2: NEGATIVE

## 2019-05-19 ENCOUNTER — Encounter: Payer: Self-pay | Admitting: Obstetrics and Gynecology

## 2019-05-19 ENCOUNTER — Other Ambulatory Visit: Payer: Self-pay

## 2019-05-19 ENCOUNTER — Observation Stay
Admission: RE | Admit: 2019-05-19 | Discharge: 2019-05-20 | Disposition: A | Discharge status: Home / Self Care | Payer: No Typology Code available for payment source | Attending: Obstetrics and Gynecology | Admitting: Obstetrics and Gynecology

## 2019-05-19 ENCOUNTER — Ambulatory Visit: Payer: No Typology Code available for payment source | Admitting: Certified Registered"

## 2019-05-19 ENCOUNTER — Encounter
Admission: RE | Disposition: A | Discharge status: Home / Self Care | Payer: Self-pay | Source: Home / Self Care | Attending: Obstetrics and Gynecology

## 2019-05-19 DIAGNOSIS — Z90710 Acquired absence of both cervix and uterus: Secondary | ICD-10-CM | POA: Diagnosis present

## 2019-05-19 DIAGNOSIS — K279 Peptic ulcer, site unspecified, unspecified as acute or chronic, without hemorrhage or perforation: Secondary | ICD-10-CM | POA: Insufficient documentation

## 2019-05-19 DIAGNOSIS — M1990 Unspecified osteoarthritis, unspecified site: Secondary | ICD-10-CM | POA: Diagnosis not present

## 2019-05-19 DIAGNOSIS — L409 Psoriasis, unspecified: Secondary | ICD-10-CM | POA: Insufficient documentation

## 2019-05-19 DIAGNOSIS — Z90721 Acquired absence of ovaries, unilateral: Secondary | ICD-10-CM

## 2019-05-19 DIAGNOSIS — Z8249 Family history of ischemic heart disease and other diseases of the circulatory system: Secondary | ICD-10-CM | POA: Insufficient documentation

## 2019-05-19 DIAGNOSIS — J45909 Unspecified asthma, uncomplicated: Secondary | ICD-10-CM | POA: Diagnosis not present

## 2019-05-19 DIAGNOSIS — I10 Essential (primary) hypertension: Secondary | ICD-10-CM | POA: Insufficient documentation

## 2019-05-19 DIAGNOSIS — K449 Diaphragmatic hernia without obstruction or gangrene: Secondary | ICD-10-CM | POA: Diagnosis not present

## 2019-05-19 DIAGNOSIS — Z888 Allergy status to other drugs, medicaments and biological substances status: Secondary | ICD-10-CM | POA: Diagnosis not present

## 2019-05-19 DIAGNOSIS — Z9049 Acquired absence of other specified parts of digestive tract: Secondary | ICD-10-CM | POA: Insufficient documentation

## 2019-05-19 DIAGNOSIS — M797 Fibromyalgia: Secondary | ICD-10-CM | POA: Diagnosis not present

## 2019-05-19 DIAGNOSIS — Z882 Allergy status to sulfonamides status: Secondary | ICD-10-CM | POA: Insufficient documentation

## 2019-05-19 DIAGNOSIS — Z853 Personal history of malignant neoplasm of breast: Secondary | ICD-10-CM | POA: Diagnosis not present

## 2019-05-19 DIAGNOSIS — R011 Cardiac murmur, unspecified: Secondary | ICD-10-CM | POA: Insufficient documentation

## 2019-05-19 DIAGNOSIS — N950 Postmenopausal bleeding: Principal | ICD-10-CM | POA: Insufficient documentation

## 2019-05-19 DIAGNOSIS — Z8052 Family history of malignant neoplasm of bladder: Secondary | ICD-10-CM | POA: Insufficient documentation

## 2019-05-19 DIAGNOSIS — Z806 Family history of leukemia: Secondary | ICD-10-CM | POA: Insufficient documentation

## 2019-05-19 DIAGNOSIS — K219 Gastro-esophageal reflux disease without esophagitis: Secondary | ICD-10-CM | POA: Diagnosis not present

## 2019-05-19 DIAGNOSIS — Z6841 Body Mass Index (BMI) 40.0 and over, adult: Secondary | ICD-10-CM | POA: Diagnosis not present

## 2019-05-19 DIAGNOSIS — E039 Hypothyroidism, unspecified: Secondary | ICD-10-CM | POA: Insufficient documentation

## 2019-05-19 DIAGNOSIS — K589 Irritable bowel syndrome without diarrhea: Secondary | ICD-10-CM | POA: Insufficient documentation

## 2019-05-19 DIAGNOSIS — Z79899 Other long term (current) drug therapy: Secondary | ICD-10-CM | POA: Diagnosis not present

## 2019-05-19 DIAGNOSIS — Z8679 Personal history of other diseases of the circulatory system: Secondary | ICD-10-CM | POA: Diagnosis not present

## 2019-05-19 DIAGNOSIS — N800 Endometriosis of uterus: Secondary | ICD-10-CM | POA: Insufficient documentation

## 2019-05-19 DIAGNOSIS — N888 Other specified noninflammatory disorders of cervix uteri: Secondary | ICD-10-CM

## 2019-05-19 DIAGNOSIS — Z803 Family history of malignant neoplasm of breast: Secondary | ICD-10-CM | POA: Insufficient documentation

## 2019-05-19 DIAGNOSIS — G4730 Sleep apnea, unspecified: Secondary | ICD-10-CM | POA: Insufficient documentation

## 2019-05-19 DIAGNOSIS — N9489 Other specified conditions associated with female genital organs and menstrual cycle: Secondary | ICD-10-CM | POA: Insufficient documentation

## 2019-05-19 DIAGNOSIS — Z833 Family history of diabetes mellitus: Secondary | ICD-10-CM | POA: Insufficient documentation

## 2019-05-19 DIAGNOSIS — Z791 Long term (current) use of non-steroidal anti-inflammatories (NSAID): Secondary | ICD-10-CM | POA: Insufficient documentation

## 2019-05-19 DIAGNOSIS — Z1501 Genetic susceptibility to malignant neoplasm of breast: Secondary | ICD-10-CM

## 2019-05-19 DIAGNOSIS — Z1509 Genetic susceptibility to other malignant neoplasm: Secondary | ICD-10-CM

## 2019-05-19 HISTORY — PX: CYSTOSCOPY: SHX5120

## 2019-05-19 HISTORY — PX: TOTAL LAPAROSCOPIC HYSTERECTOMY WITH SALPINGECTOMY: SHX6742

## 2019-05-19 LAB — GLUCOSE, CAPILLARY
Glucose-Capillary: 108 mg/dL — ABNORMAL HIGH (ref 70–99)
Glucose-Capillary: 136 mg/dL — ABNORMAL HIGH (ref 70–99)
Glucose-Capillary: 165 mg/dL — ABNORMAL HIGH (ref 70–99)
Glucose-Capillary: 173 mg/dL — ABNORMAL HIGH (ref 70–99)

## 2019-05-19 LAB — CBC
HCT: 37.4 % (ref 36.0–46.0)
Hemoglobin: 12.6 g/dL (ref 12.0–15.0)
MCH: 31.2 pg (ref 26.0–34.0)
MCHC: 33.7 g/dL (ref 30.0–36.0)
MCV: 92.6 fL (ref 80.0–100.0)
Platelets: 293 10*3/uL (ref 150–400)
RBC: 4.04 MIL/uL (ref 3.87–5.11)
RDW: 12.3 % (ref 11.5–15.5)
WBC: 17 10*3/uL — ABNORMAL HIGH (ref 4.0–10.5)
nRBC: 0 % (ref 0.0–0.2)

## 2019-05-19 SURGERY — HYSTERECTOMY, TOTAL, LAPAROSCOPIC, WITH SALPINGECTOMY
Anesthesia: General

## 2019-05-19 MED ORDER — FENTANYL CITRATE (PF) 100 MCG/2ML IJ SOLN
INTRAMUSCULAR | Status: AC
  Filled 2019-05-19: qty 2

## 2019-05-19 MED ORDER — PANTOPRAZOLE SODIUM 40 MG PO TBEC
40.0000 mg | DELAYED_RELEASE_TABLET | Freq: Every day | ORAL | Status: DC
  Administered 2019-05-20: 40 mg via ORAL
  Filled 2019-05-19: qty 1

## 2019-05-19 MED ORDER — INSULIN ASPART 100 UNIT/ML ~~LOC~~ SOLN
0.0000 [IU] | Freq: Three times a day (TID) | SUBCUTANEOUS | Status: DC
  Administered 2019-05-19 (×2): 3 [IU] via SUBCUTANEOUS
  Filled 2019-05-19 (×2): qty 1

## 2019-05-19 MED ORDER — LIDOCAINE HCL (CARDIAC) PF 100 MG/5ML IV SOSY
PREFILLED_SYRINGE | INTRAVENOUS | Status: DC | PRN
  Administered 2019-05-19: 100 mg via INTRAVENOUS

## 2019-05-19 MED ORDER — NEBIVOLOL HCL 10 MG PO TABS
10.0000 mg | ORAL_TABLET | Freq: Every evening | ORAL | Status: DC
  Administered 2019-05-19: 10 mg via ORAL
  Filled 2019-05-19 (×2): qty 1

## 2019-05-19 MED ORDER — KETOROLAC TROMETHAMINE 30 MG/ML IJ SOLN
INTRAMUSCULAR | Status: AC
  Filled 2019-05-19: qty 1

## 2019-05-19 MED ORDER — LACTATED RINGERS IV SOLN
INTRAVENOUS | Status: DC
  Administered 2019-05-19: 10:00:00 via INTRAVENOUS

## 2019-05-19 MED ORDER — LACTATED RINGERS IV SOLN
INTRAVENOUS | Status: DC | PRN
  Administered 2019-05-19 (×2): via INTRAVENOUS

## 2019-05-19 MED ORDER — ALBUTEROL SULFATE (2.5 MG/3ML) 0.083% IN NEBU: 2.5000 mg | INHALATION_SOLUTION | RESPIRATORY_TRACT | Status: DC | PRN

## 2019-05-19 MED ORDER — ONDANSETRON HCL 4 MG/2ML IJ SOLN: 4.0000 mg | Freq: Once | INTRAMUSCULAR | Status: DC | PRN

## 2019-05-19 MED ORDER — GLYCOPYRROLATE 0.2 MG/ML IJ SOLN
INTRAMUSCULAR | Status: DC | PRN
  Administered 2019-05-19: .2 mg via INTRAVENOUS

## 2019-05-19 MED ORDER — FENTANYL CITRATE (PF) 100 MCG/2ML IJ SOLN
INTRAMUSCULAR | Status: DC | PRN
  Administered 2019-05-19 (×3): 50 ug via INTRAVENOUS

## 2019-05-19 MED ORDER — PHENYLEPHRINE HCL (PRESSORS) 10 MG/ML IV SOLN
INTRAVENOUS | Status: DC | PRN
  Administered 2019-05-19 (×2): 100 ug via INTRAVENOUS

## 2019-05-19 MED ORDER — BUPIVACAINE LIPOSOME 1.3 % IJ SUSP
INTRAMUSCULAR | Status: DC | PRN
  Administered 2019-05-19: 20 mL

## 2019-05-19 MED ORDER — ONDANSETRON HCL 4 MG PO TABS: 4.0000 mg | ORAL_TABLET | Freq: Four times a day (QID) | ORAL | Status: DC | PRN

## 2019-05-19 MED ORDER — ACETAMINOPHEN 500 MG PO TABS
1000.0000 mg | ORAL_TABLET | Freq: Four times a day (QID) | ORAL | Status: DC
  Administered 2019-05-19 – 2019-05-20 (×3): 1000 mg via ORAL
  Filled 2019-05-19 (×3): qty 2

## 2019-05-19 MED ORDER — HEPARIN SODIUM (PORCINE) 10000 UNIT/ML IJ SOLN
INTRAMUSCULAR | Status: AC
  Filled 2019-05-19: qty 1

## 2019-05-19 MED ORDER — ONDANSETRON HCL 4 MG/2ML IJ SOLN: 4.0000 mg | Freq: Four times a day (QID) | INTRAMUSCULAR | Status: DC | PRN

## 2019-05-19 MED ORDER — SUGAMMADEX SODIUM 200 MG/2ML IV SOLN
INTRAVENOUS | Status: AC
  Filled 2019-05-19: qty 2

## 2019-05-19 MED ORDER — SUCCINYLCHOLINE CHLORIDE 20 MG/ML IJ SOLN
INTRAMUSCULAR | Status: DC | PRN
  Administered 2019-05-19: 120 mg via INTRAVENOUS

## 2019-05-19 MED ORDER — ROCURONIUM BROMIDE 100 MG/10ML IV SOLN
INTRAVENOUS | Status: DC | PRN
  Administered 2019-05-19: 40 mg via INTRAVENOUS
  Administered 2019-05-19: 20 mg via INTRAVENOUS
  Administered 2019-05-19 (×2): 10 mg via INTRAVENOUS
  Administered 2019-05-19: 20 mg via INTRAVENOUS

## 2019-05-19 MED ORDER — SODIUM CHLORIDE 0.9% FLUSH
3.0000 mL | INTRAVENOUS | Status: DC | PRN
  Administered 2019-05-19 – 2019-05-20 (×2): 3 mL via INTRAVENOUS

## 2019-05-19 MED ORDER — ACETAMINOPHEN 10 MG/ML IV SOLN
INTRAVENOUS | Status: DC | PRN
  Administered 2019-05-19: 1000 mg via INTRAVENOUS

## 2019-05-19 MED ORDER — SUGAMMADEX SODIUM 500 MG/5ML IV SOLN
INTRAVENOUS | Status: AC
  Filled 2019-05-19: qty 5

## 2019-05-19 MED ORDER — MONTELUKAST SODIUM 10 MG PO TABS
10.0000 mg | ORAL_TABLET | Freq: Every day | ORAL | Status: DC
  Administered 2019-05-19: 10 mg via ORAL
  Filled 2019-05-19 (×2): qty 1

## 2019-05-19 MED ORDER — LACTATED RINGERS IV SOLN: INTRAVENOUS | Status: DC

## 2019-05-19 MED ORDER — LORATADINE 10 MG PO TABS
10.0000 mg | ORAL_TABLET | Freq: Every day | ORAL | Status: DC
  Administered 2019-05-20: 10 mg via ORAL
  Filled 2019-05-19: qty 1

## 2019-05-19 MED ORDER — CYCLOBENZAPRINE HCL 10 MG PO TABS
15.0000 mg | ORAL_TABLET | Freq: Two times a day (BID) | ORAL | Status: DC
  Administered 2019-05-19 – 2019-05-20 (×2): 15 mg via ORAL
  Filled 2019-05-19 (×3): qty 1.5

## 2019-05-19 MED ORDER — ONDANSETRON HCL 4 MG/2ML IJ SOLN
INTRAMUSCULAR | Status: DC | PRN
  Administered 2019-05-19 (×2): 4 mg via INTRAVENOUS

## 2019-05-19 MED ORDER — EPHEDRINE SULFATE 50 MG/ML IJ SOLN
INTRAMUSCULAR | Status: DC | PRN
  Administered 2019-05-19: 10 mg via INTRAVENOUS
  Administered 2019-05-19: 5 mg via INTRAVENOUS
  Administered 2019-05-19: 10 mg via INTRAVENOUS
  Administered 2019-05-19: 5 mg via INTRAVENOUS

## 2019-05-19 MED ORDER — CEFAZOLIN SODIUM-DEXTROSE 2-4 GM/100ML-% IV SOLN
2.0000 g | INTRAVENOUS | Status: AC
  Administered 2019-05-19: 2 g via INTRAVENOUS

## 2019-05-19 MED ORDER — HYDROMORPHONE HCL 1 MG/ML IJ SOLN
0.2000 mg | INTRAMUSCULAR | Status: DC | PRN
  Administered 2019-05-19 – 2019-05-20 (×2): 0.5 mg via INTRAVENOUS
  Filled 2019-05-19 (×2): qty 1

## 2019-05-19 MED ORDER — BUPIVACAINE LIPOSOME 1.3 % IJ SUSP
INTRAMUSCULAR | Status: AC
  Filled 2019-05-19: qty 20

## 2019-05-19 MED ORDER — OXYCODONE HCL 5 MG PO TABS
5.0000 mg | ORAL_TABLET | ORAL | Status: DC | PRN
  Administered 2019-05-19: 10 mg via ORAL
  Filled 2019-05-19: qty 2

## 2019-05-19 MED ORDER — KETOROLAC TROMETHAMINE 30 MG/ML IJ SOLN
INTRAMUSCULAR | Status: DC | PRN
  Administered 2019-05-19: 30 mg via INTRAVENOUS

## 2019-05-19 MED ORDER — PROPOFOL 10 MG/ML IV BOLUS
INTRAVENOUS | Status: DC | PRN
  Administered 2019-05-19: 160 mg via INTRAVENOUS

## 2019-05-19 MED ORDER — SUGAMMADEX SODIUM 500 MG/5ML IV SOLN
INTRAVENOUS | Status: DC | PRN
  Administered 2019-05-19: 550 mg via INTRAVENOUS

## 2019-05-19 MED ORDER — DOXEPIN HCL 100 MG PO CAPS
100.0000 mg | ORAL_CAPSULE | Freq: Every day | ORAL | Status: DC
  Administered 2019-05-19: 100 mg via ORAL
  Filled 2019-05-19 (×2): qty 1

## 2019-05-19 MED ORDER — DOCUSATE SODIUM 100 MG PO CAPS
100.0000 mg | ORAL_CAPSULE | Freq: Two times a day (BID) | ORAL | Status: DC
  Administered 2019-05-19 – 2019-05-20 (×2): 100 mg via ORAL
  Filled 2019-05-19 (×2): qty 1

## 2019-05-19 MED ORDER — LEVOTHYROXINE SODIUM 200 MCG PO TABS
200.0000 ug | ORAL_TABLET | Freq: Every day | ORAL | Status: DC
  Filled 2019-05-19 (×2): qty 1

## 2019-05-19 MED ORDER — DOXAZOSIN MESYLATE 2 MG PO TABS
2.0000 mg | ORAL_TABLET | Freq: Every day | ORAL | Status: DC
  Administered 2019-05-19: 2 mg via ORAL
  Filled 2019-05-19 (×2): qty 1

## 2019-05-19 MED ORDER — DIPHENHYDRAMINE HCL 25 MG PO CAPS: 50.0000 mg | ORAL_CAPSULE | Freq: Four times a day (QID) | ORAL | Status: DC | PRN

## 2019-05-19 MED ORDER — ASPIRIN 81 MG PO TBEC
81.0000 mg | DELAYED_RELEASE_TABLET | Freq: Every day | ORAL | Status: DC
  Administered 2019-05-19: 81 mg via ORAL
  Filled 2019-05-19 (×2): qty 1

## 2019-05-19 MED ORDER — CEFAZOLIN SODIUM-DEXTROSE 2-4 GM/100ML-% IV SOLN
INTRAVENOUS | Status: AC
  Filled 2019-05-19: qty 100

## 2019-05-19 MED ORDER — TAMOXIFEN CITRATE 10 MG PO TABS
20.0000 mg | ORAL_TABLET | Freq: Every day | ORAL | Status: DC
  Administered 2019-05-19 – 2019-05-20 (×2): 20 mg via ORAL
  Filled 2019-05-19 (×2): qty 2

## 2019-05-19 MED ORDER — TRAZODONE HCL 50 MG PO TABS
50.0000 mg | ORAL_TABLET | Freq: Every evening | ORAL | Status: DC | PRN
  Filled 2019-05-19: qty 2

## 2019-05-19 MED ORDER — IBUPROFEN 600 MG PO TABS
600.0000 mg | ORAL_TABLET | Freq: Four times a day (QID) | ORAL | Status: DC
  Administered 2019-05-19: 600 mg via ORAL
  Filled 2019-05-19: qty 1

## 2019-05-19 MED ORDER — SODIUM CHLORIDE 0.9 % IV SOLN
INTRAVENOUS | Status: DC | PRN
  Administered 2019-05-19: 25 mL/h via INTRAVENOUS

## 2019-05-19 MED ORDER — FENTANYL CITRATE (PF) 100 MCG/2ML IJ SOLN
25.0000 ug | INTRAMUSCULAR | Status: DC | PRN
  Administered 2019-05-19 (×4): 25 ug via INTRAVENOUS

## 2019-05-19 MED ORDER — SIMETHICONE 80 MG PO CHEW
80.0000 mg | CHEWABLE_TABLET | Freq: Four times a day (QID) | ORAL | Status: DC | PRN
  Administered 2019-05-19: 16:00:00 80 mg via ORAL
  Filled 2019-05-19: qty 1

## 2019-05-19 MED ORDER — IBUPROFEN 600 MG PO TABS
600.0000 mg | ORAL_TABLET | Freq: Four times a day (QID) | ORAL | Status: DC
  Administered 2019-05-20 (×2): 600 mg via ORAL
  Filled 2019-05-19 (×2): qty 1

## 2019-05-19 MED ORDER — LISINOPRIL 20 MG PO TABS
40.0000 mg | ORAL_TABLET | ORAL | Status: DC
  Administered 2019-05-20: 40 mg via ORAL
  Filled 2019-05-19 (×2): qty 2

## 2019-05-19 MED ORDER — MIDAZOLAM HCL 2 MG/2ML IJ SOLN
INTRAMUSCULAR | Status: DC | PRN
  Administered 2019-05-19: 2 mg via INTRAVENOUS

## 2019-05-19 MED ORDER — DEXAMETHASONE SODIUM PHOSPHATE 10 MG/ML IJ SOLN
INTRAMUSCULAR | Status: DC | PRN
  Administered 2019-05-19: 10 mg via INTRAVENOUS

## 2019-05-19 MED ORDER — MIDAZOLAM HCL 2 MG/2ML IJ SOLN
INTRAMUSCULAR | Status: AC
  Filled 2019-05-19: qty 2

## 2019-05-19 MED ORDER — ACETAMINOPHEN NICU IV SYRINGE 10 MG/ML
INTRAVENOUS | Status: AC
  Filled 2019-05-19: qty 1

## 2019-05-19 SURGICAL SUPPLY — 55 items
BAG URINE DRAIN 2000ML AR STRL (UROLOGICAL SUPPLIES) ×4 IMPLANT
BLADE SURG SZ11 CARB STEEL (BLADE) ×4 IMPLANT
CANISTER SUCT 1200ML W/VALVE (MISCELLANEOUS) ×4 IMPLANT
CATH FOLEY 2WAY  5CC 16FR (CATHETERS) ×2
CATH URTH 16FR FL 2W BLN LF (CATHETERS) ×2 IMPLANT
CELL SAVER STANDBY W/O COLL (MISCELLANEOUS) ×4
CHLORAPREP W/TINT 26 (MISCELLANEOUS) ×4 IMPLANT
DEFOGGER SCOPE WARMER CLEARIFY (MISCELLANEOUS) ×4 IMPLANT
DERMABOND ADVANCED (GAUZE/BANDAGES/DRESSINGS) ×2
DERMABOND ADVANCED .7 DNX12 (GAUZE/BANDAGES/DRESSINGS) ×2 IMPLANT
DEVICE SUTURE ENDOST 10MM (ENDOMECHANICALS) ×4 IMPLANT
DRAPE CAMERA CLOSED 9X96 (DRAPES) ×4 IMPLANT
DRSG TEGADERM 2X2.25 PEDS (GAUZE/BANDAGES/DRESSINGS) ×16 IMPLANT
GLOVE BIOGEL PI IND STRL 6.5 (GLOVE) ×2 IMPLANT
GLOVE BIOGEL PI INDICATOR 6.5 (GLOVE) ×2
GLOVE SURG SYN 6.5 ES PF (GLOVE) ×12 IMPLANT
GOWN STRL REUS W/ TWL LRG LVL3 (GOWN DISPOSABLE) ×6 IMPLANT
GOWN STRL REUS W/TWL LRG LVL3 (GOWN DISPOSABLE) ×6
GRASPER SUT TROCAR 14GX15 (MISCELLANEOUS) IMPLANT
IRRIGATION STRYKERFLOW (MISCELLANEOUS) ×2 IMPLANT
IRRIGATOR STRYKERFLOW (MISCELLANEOUS) ×4
IV LACTATED RINGERS 1000ML (IV SOLUTION) ×8 IMPLANT
KIT PINK PAD W/HEAD ARE REST (MISCELLANEOUS) ×4
KIT PINK PAD W/HEAD ARM REST (MISCELLANEOUS) ×2 IMPLANT
KIT TURNOVER CYSTO (KITS) ×8 IMPLANT
MANIPULATOR VCARE LG CRV RETR (MISCELLANEOUS) IMPLANT
MANIPULATOR VCARE SML CRV RETR (MISCELLANEOUS) IMPLANT
MANIPULATOR VCARE STD CRV RETR (MISCELLANEOUS) ×4 IMPLANT
NS IRRIG 500ML POUR BTL (IV SOLUTION) ×4 IMPLANT
OCCLUDER COLPOPNEUMO (BALLOONS) ×4 IMPLANT
PACK GYN LAPAROSCOPIC (MISCELLANEOUS) ×4 IMPLANT
PAD OB MATERNITY 4.3X12.25 (PERSONAL CARE ITEMS) ×4 IMPLANT
PAD PREP 24X41 OB/GYN DISP (PERSONAL CARE ITEMS) ×4 IMPLANT
SCISSORS METZENBAUM CVD 33 (INSTRUMENTS) ×4 IMPLANT
SET CYSTO W/LG BORE CLAMP LF (SET/KITS/TRAYS/PACK) ×4 IMPLANT
SET TRI-LUMEN FLTR TB AIRSEAL (TUBING) ×4 IMPLANT
SHEARS HARMONIC ACE PLUS 36CM (ENDOMECHANICALS) ×4 IMPLANT
SLEEVE ENDOPATH XCEL 5M (ENDOMECHANICALS) ×4 IMPLANT
SPONGE GAUZE 2X2 8PLY STER LF (GAUZE/BANDAGES/DRESSINGS) ×4
SPONGE GAUZE 2X2 8PLY STRL LF (GAUZE/BANDAGES/DRESSINGS) ×12 IMPLANT
STANDBY W/O COLL CELL SAVER (MISCELLANEOUS) ×2 IMPLANT
SUT DVC VLOC 180 0 12IN GS21 (SUTURE) ×4
SUT ENDO VLOC 180-0-8IN (SUTURE) ×8 IMPLANT
SUT MNCRL 4-0 (SUTURE) ×2
SUT MNCRL 4-0 27XMFL (SUTURE) ×2
SUT VIC AB 0 CT1 36 (SUTURE) ×4 IMPLANT
SUTURE DVC VLC 180 0 12IN GS21 (SUTURE) ×2 IMPLANT
SUTURE MNCRL 4-0 27XMF (SUTURE) ×2 IMPLANT
SYR 10ML LL (SYRINGE) ×4 IMPLANT
SYR 50ML LL SCALE MARK (SYRINGE) ×4 IMPLANT
TROCAR 5M 150ML BLDLS (TROCAR) ×4 IMPLANT
TROCAR PORT AIRSEAL 8X100 (TROCAR) ×4 IMPLANT
TROCAR XCEL NON-BLD 5MMX100MML (ENDOMECHANICALS) ×4 IMPLANT
TUBING EVAC SMOKE HEATED PNEUM (TUBING) IMPLANT
V-loc 180 IMPLANT

## 2019-05-19 NOTE — Anesthesia Postprocedure Evaluation (Signed)
Anesthesia Post Note  Patient: [name]  Procedure(s) Performed: TOTAL LAPAROSCOPIC HYSTERECTOMY WITH SALPINGECTOMY/ TLH/BSO (N/A ) CYSTOSCOPY  Patient location during evaluation: PACU Anesthesia Type: General Level of consciousness: awake and alert and oriented Pain management: pain level controlled Vital Signs Assessment: post-procedure vital signs reviewed and stable Respiratory status: spontaneous breathing Cardiovascular status: blood pressure returned to baseline Anesthetic complications: no     Last Vitals:  Vitals:   05/19/19 1340 05/19/19 1345  BP: 129/61   Pulse: 96 91  Resp: (!) 22 10  Temp: 36.5 C   SpO2: 100% 99%    Last Pain:  Vitals:   05/19/19 1340  TempSrc:   PainSc: 0-No pain                 Damaree Sargent

## 2019-05-19 NOTE — Anesthesia Procedure Notes (Signed)
Procedure Name: Intubation Performed by: Fletcher-Harrison, Amritpal Shropshire, CRNA Pre-anesthesia Checklist: Patient identified, Emergency Drugs available, Suction available and Patient being monitored Patient Re-evaluated:Patient Re-evaluated prior to induction Oxygen Delivery Method: Circle system utilized Preoxygenation: Pre-oxygenation with 100% oxygen Induction Type: IV induction Ventilation: Mask ventilation without difficulty Laryngoscope Size: McGraph and 3 Grade View: Grade I Tube type: Oral Tube size: 7.0 mm Number of attempts: 1 Airway Equipment and Method: Stylet Placement Confirmation: ETT inserted through vocal cords under direct vision,  positive ETCO2,  CO2 detector and breath sounds checked- equal and bilateral Secured at: 21 cm Tube secured with: Tape Dental Injury: Teeth and Oropharynx as per pre-operative assessment        

## 2019-05-19 NOTE — Anesthesia Post-op Follow-up Note (Signed)
Anesthesia QCDR form completed.        

## 2019-05-19 NOTE — Discharge Instructions (Signed)

## 2019-05-19 NOTE — Interval H&P Note (Signed)
History and Physical Interval Note:  05/19/2019 10:20 AM  [name]  has presented today for surgery, with the diagnosis of BRCA 2.  The various methods of treatment have been discussed with the patient and family. After consideration of risks, benefits and other options for treatment, the patient has consented to  Procedure(s): TOTAL LAPAROSCOPIC HYSTERECTOMY WITH SALPINGECTOMY/ TLH/BSO (N/A) as a surgical intervention.  The patient's history has been reviewed, patient examined, no change in status, stable for surgery.  I have reviewed the patient's chart and labs.  Questions were answered to the patient's satisfaction.     [place]

## 2019-05-19 NOTE — Op Note (Signed)
Operative Report:  PRE-OP DIAGNOSIS: BRCA 2 , Postmenopausal uterine bleeding  POST-OP DIAGNOSIS: BRCA 2, Postmenopausal uterine bleeding  PROCEDURE: Procedure(s): TOTAL LAPAROSCOPIC HYSTERECTOMY WITH BILATERAL SALPINGO-OOPHORECTOMY, CYSTOSCOPY  SURGEON: Adrian Prows MD  ASSISTANT: Malachy Mood MD   ANESTHESIA: General endotracheal anesthesia  ESTIMATED BLOOD LOSS: 100 cc  SPECIMENS: Uterus, Tubes.  COMPLICATIONS: None  DISPOSITION: stable to PACU  FINDINGS: Intraabdominal adhesions were noted between the bowel and left ovary and cervix. Fatty infiltrate of liver. Grossly normal uterus, fallopian tubes and ovaries.  PROCEDURE:  . The patient was taken to the OR where anesthesia was administed. She was prepped and draped in the normal sterile fashion in the dorsal lithotomy position in the [place] stirrups. A time out was performed. A Graves speculum was inserted, the cervix was grasped with a single tooth tenaculum and the endometrial cavity was sounded. The cervix was progressively dilated to a size 18 [place] with [employer] dilators. A V-Care uterine manipulator was inserted in the usual fashion without incident. Gloves were changed and attention was turned to the abdomen.    An infraumbilical verticle 8 mm skin incision was made with the scalpel after local anesthesia applied to the skin. A 5 mm trocar was inserted under direct visualization.  A pneumoperitoneum was obtained by insufflation of CO2 (opening pressure of 83mHg) to 161mg. A diagnostic laparoscopy was performed yielding the previously described findings. Attention was turned to the left lower quadrant where after visualization of the inferior epigastric vessels a 31m29mkin incision was made with the scalpel. A 5 mm laparoscopic port was inserted. The umbilical port was exchanged for a 8 mm Airseal port. Attention was turned to the right lower quadrant where after visualization of the inferior epigastric vessels a 31mm52mkin incision was made with the scalpel. A 5 mm laparoscopic port was inserted.    . Filmy adhesions were excised using the harmonic scalpel between the bowel and left aspect of the ovary and uterus. Care was taken to avoid the bowel. Attention was then turned to the left IP ligament which was recognized by visualization of the ovary and fallopian tube. The left IP ligament was excised. Attention was turned to the left aspect of the uterus, where after visualization of the ureter, the round ligament was coagulated and transected using the 31mm 20mmonic Scapel. The anterior and posterior leafs of the broad ligament were dissected off as the anterior one was coagulated and transected in a caudal direction towards the cuff of the uterine manipulator.   The uterine-ovarian ligament and its blood vessels were carefully coagulated and transected using the Harmonic scapel.  Attention was turned to the right aspect of the uterus where the same procedure was performed.  The vesicouterine reflection of the peritoneum was dissected with the harmonic scapel and the bladder flap was created bluntly.  The uterine vessels were coagulated and transected bilaterally using first bipolar cautery and then the harmonic scapel. A 360 degree, circumferential colpotomy was done to completely amputate the uterus with cervix and tubes. Once the specimen was amputated it was delivered through the vagina.   . The colpotomy was repaired in a simple running fashion using a delayed absorbable suture with an endo-stitch device.  Vaginal exam confirmed complete closure.  The cavity was copiously irrigated. A survey of the pelvic cavity revealed adequate hemostasis and no injury to bowel, bladder, or ureter.   . A diagnostic cystoscopy was performed using saline distension of bladder with no lesions or injuries noted.  Bilateral  urine flow from each ureteral orifice was visualized.  . At this point the procedure was finalized. umbilical  fascia incision was closed with a vicryl suture using the fascia closure device. All the instruments were removed from the patient's body. Gas was expelled and patient is leveled.  Incisions are closed with 4-0 monocryl and skin adhesive.    . Patient was taken to the recovery room in stable condition.  All sponge, instrument, and needle counts are correct x2.    . Pathology was notified of the patient's BRCA2 status so that serial section of the ovary and fallopian tube could be performed.    Adrian Prows MD [facility] 05/19/2019 1:56 PM

## 2019-05-19 NOTE — Anesthesia Preprocedure Evaluation (Signed)
Anesthesia Evaluation  Patient identified by MRN, date of birth, ID band Patient awake    Reviewed: Allergy & Precautions, NPO status , Patient's Chart, lab work & pertinent test results, reviewed documented beta blocker date and time   History of Anesthesia Complications (+) Emergence Delirium and history of anesthetic complications  Airway Mallampati: III  TM Distance: <3 FB     Dental  (+) Caps, Chipped   Pulmonary asthma , sleep apnea and Continuous Positive Airway Pressure Ventilation ,    Pulmonary exam normal        Cardiovascular hypertension, Pt. on medications and Pt. on home beta blockers Normal cardiovascular exam+ Valvular Problems/Murmurs      Neuro/Psych negative neurological ROS  negative psych ROS   GI/Hepatic Neg liver ROS, hiatal hernia, PUD, GERD  Medicated,  Endo/Other  Hypothyroidism Morbid obesity  Renal/GU negative Renal ROS  Female GU complaint     Musculoskeletal  (+) Arthritis , Osteoarthritis,  Fibromyalgia -  Abdominal Normal abdominal exam  (+)   Peds negative pediatric ROS (+)  Hematology negative hematology ROS (+)   Anesthesia Other Findings   Reproductive/Obstetrics                             Anesthesia Physical  Anesthesia Plan  ASA: III  Anesthesia Plan: General   Post-op Pain Management:    Induction: Intravenous, Rapid sequence and Cricoid pressure planned  PONV Risk Score and Plan:   Airway Management Planned: Oral ETT  Additional Equipment:   Intra-op Plan:   Post-operative Plan: Extubation in OR  Informed Consent: I have reviewed the patients History and Physical, chart, labs and discussed the procedure including the risks, benefits and alternatives for the proposed anesthesia with the patient or authorized representative who has indicated his/her understanding and acceptance.     Dental advisory given  Plan Discussed with:  CRNA and Surgeon  Anesthesia Plan Comments: (Patient is a Jehovah witness and is refusing any  blood products, but will take non blood volume expanders.  Patient understands risks. )        Anesthesia Quick Evaluation

## 2019-05-19 NOTE — Transfer of Care (Signed)
Immediate Anesthesia Transfer of Care Note  Patient: [name]  Procedure(s) Performed: TOTAL LAPAROSCOPIC HYSTERECTOMY WITH SALPINGECTOMY/ TLH/BSO (N/A ) CYSTOSCOPY  Patient Location: PACU  Anesthesia Type:General  Level of Consciousness: awake, oriented, drowsy and patient cooperative  Airway & Oxygen Therapy: Patient Spontanous Breathing and Patient connected to face mask oxygen  Post-op Assessment: Report given to RN and Post -op Vital signs reviewed and stable  Post vital signs: Reviewed and stable  Last Vitals:  Vitals Value Taken Time  BP 129/61 05/19/19 1341  Temp 36.5 C 05/19/19 1340  Pulse 90 05/19/19 1345  Resp 12 05/19/19 1345  SpO2 99 % 05/19/19 1345  Vitals shown include unvalidated device data.  Last Pain:  Vitals:   05/19/19 0931  TempSrc: Oral         Complications: No apparent anesthesia complications

## 2019-05-20 DIAGNOSIS — N950 Postmenopausal bleeding: Secondary | ICD-10-CM | POA: Diagnosis not present

## 2019-05-20 LAB — GLUCOSE, CAPILLARY: Glucose-Capillary: 111 mg/dL — ABNORMAL HIGH (ref 70–99)

## 2019-05-20 MED ORDER — BLOOD GLUCOSE MONITOR KIT: PACK | 0 refills | Status: AC

## 2019-05-20 MED ORDER — HYDROMORPHONE HCL 2 MG PO TABS: 2.0000 mg | ORAL_TABLET | ORAL | Status: DC | PRN

## 2019-05-20 MED ORDER — IBUPROFEN 800 MG PO TABS: 800.0000 mg | ORAL_TABLET | Freq: Three times a day (TID) | ORAL | 1 refills | Status: DC | PRN

## 2019-05-20 MED ORDER — LEVOTHYROXINE SODIUM 100 MCG PO TABS
200.0000 ug | ORAL_TABLET | Freq: Every day | ORAL | Status: DC
  Administered 2019-05-20: 200 ug via ORAL
  Filled 2019-05-20: qty 1
  Filled 2019-05-20 (×2): qty 2

## 2019-05-20 MED ORDER — HYDROMORPHONE HCL 2 MG PO TABS: 2.0000 mg | ORAL_TABLET | ORAL | 0 refills | Status: DC | PRN

## 2019-05-20 NOTE — Discharge Summary (Signed)
Physician Discharge Summary  Patient ID: [name] MRN: [id number] DOB/AGE: [birth year] 58 y.o.  Admit date: 05/19/2019 Discharge date: 05/20/2019  Admission Diagnoses: Scheduled hysterectomy  Discharge Diagnoses:  Active Problems:   S/P hysterectomy with oophorectomy   Discharged Condition: good  Hospital Course: Uncomplicated surgery with benign postoperative course. Patient remained hemodynamically stable, afebrile, tolerated po, and reported good pain control through out.admission. Discharged on POD1  Consults: None  Significant Diagnostic Studies: Results for orders placed or performed during the hospital encounter of 05/19/19 (from the past 24 hour(s))  Glucose, capillary     Status: Abnormal   Collection Time: 05/19/19  9:25 AM  Result Value Ref Range   Glucose-Capillary 108 (H) 70 - 99 mg/dL  Glucose, capillary     Status: Abnormal   Collection Time: 05/19/19  1:52 PM  Result Value Ref Range   Glucose-Capillary 136 (H) 70 - 99 mg/dL   Comment 1 Notify RN    Comment 2 Document in Chart   CBC     Status: Abnormal   Collection Time: 05/19/19  4:02 PM  Result Value Ref Range   WBC 17.0 (H) 4.0 - 10.5 K/uL   RBC 4.04 3.87 - 5.11 MIL/uL   Hemoglobin 12.6 12.0 - 15.0 g/dL   HCT 37.4 36.0 - 46.0 %   MCV 92.6 80.0 - 100.0 fL   MCH 31.2 26.0 - 34.0 pg   MCHC 33.7 30.0 - 36.0 g/dL   RDW 12.3 11.5 - 15.5 %   Platelets 293 150 - 400 K/uL   nRBC 0.0 0.0 - 0.2 %  Glucose, capillary     Status: Abnormal   Collection Time: 05/19/19  5:41 PM  Result Value Ref Range   Glucose-Capillary 165 (H) 70 - 99 mg/dL  Glucose, capillary     Status: Abnormal   Collection Time: 05/19/19  9:48 PM  Result Value Ref Range   Glucose-Capillary 173 (H) 70 - 99 mg/dL  Glucose, capillary     Status: Abnormal   Collection Time: 05/20/19  7:39 AM  Result Value Ref Range   Glucose-Capillary 111 (H) 70 - 99 mg/dL     Treatments: surgery: TLH, BSO, cystoscopy  Discharge Exam: Blood  pressure (!) 120/55, pulse 80, temperature 98 F (36.7 C), temperature source Oral, resp. rate 18, SpO2 97 %. General appearance: alert, appears stated age and no distress Resp: clear to auscultation bilaterally Cardio: rrr GI: soft, non-tender; bowel sounds normal; no masses,  no organomegaly Extremities: extremities normal, atraumatic, no cyanosis or edema Incision/Wound:D/C/I  Disposition: Discharge disposition: 01-Home or Self Care       Discharge Instructions    Call MD for:   Complete by: As directed    Heavy vaginal bleeding greater than 1 pad an hour   Call MD for:  difficulty breathing, headache or visual disturbances   Complete by: As directed    Call MD for:  extreme fatigue   Complete by: As directed    Call MD for:  hives   Complete by: As directed    Call MD for:  persistant dizziness or light-headedness   Complete by: As directed    Call MD for:  persistant nausea and vomiting   Complete by: As directed    Call MD for:  redness, tenderness, or signs of infection (pain, swelling, redness, odor or green/yellow discharge around incision site)   Complete by: As directed    Call MD for:  severe uncontrolled pain   Complete by: As  directed    Call MD for:  temperature >100.4   Complete by: As directed    Diet general   Complete by: As directed    Discharge wound care:   Complete by: As directed    You may apply a light dressing for minor discharge from the incision or to keep waistbands of clothing from rubbing.  You may also have been discharge with a clear dressing in which case this will be removed at your postoperative clinic visit.  You may shower, use soap on your incision.  Avoid baths or soaking the incision in the first 6 weeks following your surgery..   Driving restriction   Complete by: As directed    Avoid driving for at least 2 weeks or while taking prescription narcotics.   Lifting restrictions   Complete by: As directed    Weight restriction of  10lbs for 6 weeks.     Allergies as of 05/20/2019      Reactions   Sulfa Antibiotics Other (See Comments)   seizure   Amlodipine Swelling   Leg swelling   Furosemide Swelling   Lyrica [pregabalin] Other (See Comments)   Dizziness and blurred vision   Hydrochlorothiazide Itching, Rash      Medication List    STOP taking these medications   acetaminophen 500 MG tablet Commonly known as: TYLENOL   meloxicam 15 MG tablet Commonly known as: MOBIC   multivitamin with minerals Tabs tablet   traMADol 50 MG tablet Commonly known as: ULTRAM     TAKE these medications   albuterol 108 (90 Base) MCG/ACT inhaler Commonly known as: VENTOLIN HFA Inhale 2 puffs into the lungs 4 (four) times daily as needed.   aspirin 81 MG EC tablet Take 81 mg by mouth at bedtime.   blood glucose meter kit and supplies Kit Dispense based on patient and insurance preference. Use up to four times daily as directed. (FOR ICD-9 250.00, 250.01).   Bystolic 10 MG tablet Generic drug: nebivolol TAKE 1 TABLET BY MOUTH DAILY What changed:   how much to take  when to take this   cetirizine 10 MG tablet Commonly known as: ZYRTEC Take 10 mg by mouth every morning.   cholestyramine light 4 g packet Commonly known as: PREVALITE MIX 1 PACKET AS DIRECTED AND DRINK ONCE DAILY What changed:   how much to take  how to take this  when to take this  additional instructions   clobetasol ointment 0.05 % Commonly known as: TEMOVATE Apply to affected area every night for 4 weeks, then every other day for 4 weeks and then twice a week for 4 weeks or until resolution. What changed:   how much to take  how to take this  when to take this  reasons to take this   cyclobenzaprine 5 MG tablet Commonly known as: FLEXERIL TAKE 3 TABLETS BY MOUTH TWO TIMES DAILY What changed: See the new instructions.   diphenhydrAMINE 25 MG tablet Commonly known as: BENADRYL Take 25-50 mg by mouth every 6 (six)  hours as needed (allergies/asthma).   doxazosin 2 MG tablet Commonly known as: CARDURA TAKE 1 TABLET (2 MG TOTAL) BY MOUTH DAILY. What changed: when to take this   doxepin 100 MG capsule Commonly known as: SINEQUAN TAKE 1 CAPSULE BY MOUTH ONCE DAILY AT BEDTIME What changed: See the new instructions.   esomeprazole 40 MG capsule Commonly known as: NEXIUM TAKE 1 CAPSULE (40 MG TOTAL) BY MOUTH AT BEDTIME. What changed:  when to take this  additional instructions   HYDROmorphone 2 MG tablet Commonly known as: Dilaudid Take 1 tablet (2 mg total) by mouth every 4 (four) hours as needed for severe pain.   ibuprofen 800 MG tablet Commonly known as: ADVIL Take 1 tablet (800 mg total) by mouth every 8 (eight) hours as needed for mild pain or cramping. What changed: reasons to take this   levothyroxine 200 MCG tablet Commonly known as: SYNTHROID TAKE 1 TABLET (200 MCG TOTAL) BY MOUTH DAILY. What changed: when to take this   lisinopril 40 MG tablet Commonly known as: ZESTRIL TAKE 1 TABLET BY MOUTH DAILY What changed: when to take this   loperamide 2 MG tablet Commonly known as: IMODIUM A-D Take 2-4 mg by mouth 4 (four) times daily as needed for diarrhea or loose stools.   montelukast 10 MG tablet Commonly known as: SINGULAIR TAKE 1 TABLET BY MOUTH NIGHTLY AT BEDTIME What changed: See the new instructions.   tamoxifen 20 MG tablet Commonly known as: NOLVADEX Take 1 tablet (20 mg total) by mouth daily. What changed: when to take this   traZODone 50 MG tablet Commonly known as: DESYREL Take 1-2 tablets (50-100 mg total) by mouth at bedtime as needed for sleep.   vitamin B-12 1000 MCG tablet Commonly known as: CYANOCOBALAMIN Take 1,000 mcg by mouth daily.            Discharge Care Instructions  (From admission, onward)         Start     Ordered   05/20/19 0000  Discharge wound care:    Comments: You may apply a light dressing for minor discharge from the  incision or to keep waistbands of clothing from rubbing.  You may also have been discharge with a clear dressing in which case this will be removed at your postoperative clinic visit.  You may shower, use soap on your incision.  Avoid baths or soaking the incision in the first 6 weeks following your surgery..   05/20/19 0044         Follow-up Information    Schuman, Stefanie Libel, MD In 1 week.   Specialty: Obstetrics and Gynecology Why: For incision check Contact information: [address] [place] [address] [phone]           Signed: Malachy Mood 05/20/2019, 7:45 AM

## 2019-05-20 NOTE — Progress Notes (Signed)
Patient discharged home with spouse. Discharge instructions and prescriptions given and reviewed with patient. Patient verbalized understanding. F/u appointment made for Dec 17. Escorted out by staff.

## 2019-05-23 ENCOUNTER — Other Ambulatory Visit: Payer: Self-pay | Admitting: *Deleted

## 2019-05-23 ENCOUNTER — Encounter: Payer: Self-pay | Admitting: *Deleted

## 2019-05-23 LAB — SURGICAL PATHOLOGY

## 2019-05-23 NOTE — Patient Outreach (Signed)
[facility]  05/23/2019  [name] 04-21-1961 [id number]   Transition of care call/case closure   Referral received:05/19/19 Initial outreach:05/23/19 Insurance: [name]    Subjective: Initial successful telephone call to patient's preferred number in order to complete transition of care assessment; 2 HIPAA identifiers verified. Explained purpose of call and completed transition of care assessment.  States she is doing okay,  denies post-operative problems, says surgical incisions are unremarkable, states surgical pain well managed with prescribed medications she is alternating dilaudid with Advil.  tolerating diet, denies bowel or bladder problems.  Spouse is  assisting with her  recovery.  She denies any ongoing health issues and says she does not need a referral to one of the [name] chronic disease [facility] programs.  She discussed having hypertension that is controlled.She discussed her blood sugars running higher after surgery, and she was prescribed a meter to monitor reading at home, she has been keeping a record ranges from 89 fasting to 142 after meal, she anticipates levels to come down and believes it could be related to stress of surgery. Encouraged to continue to keep a record and follow up with Dr. Army Jones sooner if concerns. She discussed attending classes on  Diabetes and nutrition with her spouse  education  She says she does have the hospital indemnity and has placed call to file a claim, she states they have submitted FMLA for spouse a [employer] employee for leave also.  She uses  [name].  She denies educational needs related to staying safe during the COVID 19 pandemic.    Objective:  [name]  was hospitalized at [facility] from 12/10-12/11/20  for  Total laparospopic hysterectomy with salpingectomy Comorbidities include: hypertension, prediabetes, 05/13/19 A1c 6.1.   She was discharged to home on 05/20/19  without the need for home health services or DME.   Assessment:  Patient voices good understanding of all discharge instructions.  See transition of care flowsheet for assessment details.   Plan:  Reviewed hospital discharge diagnosis of Total laparoscopic hysterectomy with salpingectomy    and discharge treatment plan using hospital discharge instructions, assessing medication adherence, reviewing problems requiring provider notification, and discussing the importance of follow up with surgeon, primary care provider and/or specialists as directed. Reviewed [name]'s announcements that all [place] members will receive the Healthy Lifestyle Premium rate in 2021.  No ongoing care Jones needs identified so will close case to [name] services and route successful outreach letter with [name] pamphlet and 24 Hour Nurse Line Magnet to [place] [facility] clinical pool to be mailed to patient's home address. Thanked patient for their services to [name].   Kaitlyn Draft, RN, [place] [facility] Coordinator  [phone]- Mobile [phone]- Toll Free Main Office

## 2019-05-24 ENCOUNTER — Other Ambulatory Visit: Payer: Self-pay | Admitting: Obstetrics and Gynecology

## 2019-05-24 NOTE — Progress Notes (Signed)
Patient doing well after surgery at home.  Urinating normally. Having normal bowel movements. Pain has been controlled with oral dilaudid and motrin.  Drinking large amounts of water.   Friday: 11:55 178- before lunch Saturday: 9:55 158- fasting Sunday: 7:50 89 fasting  12:20 105- before lunch  Monday: 12:20 142- before lunch 16:25 133- before dinner Tuesday 7: 30 fasting 103  13:10: 91 - before lunch   Continue to monitor, will address again at her upcoming visit.  Reviewed pathology result with the patient.   Adrian Prows MD [facility] 05/24/2019 5:21 PM

## 2019-05-26 ENCOUNTER — Other Ambulatory Visit: Payer: Self-pay

## 2019-05-26 ENCOUNTER — Encounter: Payer: Self-pay | Admitting: Obstetrics and Gynecology

## 2019-05-26 ENCOUNTER — Ambulatory Visit (INDEPENDENT_AMBULATORY_CARE_PROVIDER_SITE_OTHER): Payer: No Typology Code available for payment source | Admitting: Obstetrics and Gynecology

## 2019-05-26 VITALS — BP 134/84 | HR 82 | Ht 61.0 in | Wt 217.0 lb

## 2019-05-26 DIAGNOSIS — Z90710 Acquired absence of both cervix and uterus: Secondary | ICD-10-CM

## 2019-05-26 DIAGNOSIS — Z90721 Acquired absence of ovaries, unilateral: Secondary | ICD-10-CM

## 2019-05-26 DIAGNOSIS — Z0289 Encounter for other administrative examinations: Secondary | ICD-10-CM

## 2019-05-26 NOTE — Progress Notes (Signed)
  Postoperative Follow-up Patient presents post op from laparoscopic assisted vaginal hysterectomy bilateral salpingo-oophorectomy for BRCA2 +, 1 week ago.  Subjective: Patient reports marked improvement in her preop symptoms. Eating a regular diet without difficulty. Pain is controlled with current analgesics. Medications being used: acetaminophen and ibuprofen (OTC).  Activity: normal activities of daily living. Patient reports additional symptom's since surgery of Fatigue.  She has continued to check blood glucose and they have improved to the 90-120s.   Objective: BP 134/84   Pulse 82   Ht '5\' 1"'$  (1.549 m)   Wt 217 lb (98.4 kg)   BMI 41.00 kg/m  Physical Exam Constitutional:      Appearance: She is well-developed.  Genitourinary:     Vagina and uterus normal.     No lesions in the vagina.     No cervical motion tenderness.     No right or left adnexal mass present.  HENT:     Head: Normocephalic and atraumatic.  Neck:     Thyroid: No thyromegaly.  Cardiovascular:     Rate and Rhythm: Normal rate and regular rhythm.     Heart sounds: Normal heart sounds.  Pulmonary:     Effort: Pulmonary effort is normal.     Breath sounds: Normal breath sounds.  Chest:     Breasts:        Right: No inverted nipple, mass, nipple discharge or skin change.        Left: No inverted nipple, mass, nipple discharge or skin change.  Abdominal:     General: Abdomen is flat. Bowel sounds are normal. There is no distension.     Palpations: Abdomen is soft. There is no mass.     Comments: Incisions are healing well without induration or erythema. Intact, no drainage.  Musculoskeletal:     Cervical back: Neck supple.  Neurological:     Mental Status: She is alert and oriented to person, place, and time.  Skin:    General: Skin is warm and dry.  Psychiatric:        Behavior: Behavior normal.        Thought Content: Thought content normal.        Judgment: Judgment normal.  Vitals reviewed.     Assessment: s/p :   laparoscopic assisted vaginal hysterectomy bilateral salpingo-oophorectomy stable  Plan: Patient has done well after surgery with no apparent complications.  I have discussed the post-operative course to date, and the expected progress moving forward.  The patient understands what complications to be concerned about.  I will see the patient in routine follow up, or sooner if needed.    May discontinue blood glucose checks. Continue with healthy eating. Slowly increase activity.   Activity plan: No heavy lifting. Pelvic rest.  Jaryn Rosko R Dashaun Onstott 05/26/2019, 2:25 PM

## 2019-06-02 ENCOUNTER — Other Ambulatory Visit: Payer: Self-pay | Admitting: Internal Medicine

## 2019-06-02 DIAGNOSIS — K580 Irritable bowel syndrome with diarrhea: Secondary | ICD-10-CM

## 2019-06-07 ENCOUNTER — Other Ambulatory Visit: Payer: Self-pay | Admitting: Internal Medicine

## 2019-06-23 ENCOUNTER — Other Ambulatory Visit: Payer: Self-pay

## 2019-06-23 ENCOUNTER — Encounter: Payer: Self-pay | Admitting: Obstetrics and Gynecology

## 2019-06-23 ENCOUNTER — Ambulatory Visit (INDEPENDENT_AMBULATORY_CARE_PROVIDER_SITE_OTHER): Payer: No Typology Code available for payment source | Admitting: Obstetrics and Gynecology

## 2019-06-23 DIAGNOSIS — Z48816 Encounter for surgical aftercare following surgery on the genitourinary system: Secondary | ICD-10-CM

## 2019-06-23 DIAGNOSIS — Z90721 Acquired absence of ovaries, unilateral: Secondary | ICD-10-CM

## 2019-06-23 DIAGNOSIS — Z90710 Acquired absence of both cervix and uterus: Secondary | ICD-10-CM

## 2019-06-23 NOTE — Progress Notes (Signed)
Virtual Visit via Telephone Note  I connected with [name] on 06/23/19 at  2:10 PM EST by telephone and verified that I am speaking with the correct person using two identifiers.   I discussed the limitations, risks, security and privacy concerns of performing an evaluation and management service by telephone and the availability of in person appointments. I also discussed with the patient that there may be a patient responsible charge related to this service. The patient expressed understanding and agreed to proceed.  The patient was at home I spoke with the patient from my  office The names of people involved in this encounter were: [name] and Dr. Gilman Jones.   History of Present Illness: She has been doing well since the surgery. She is not taking pain medications. No problems with urination or bowel movements. No vaginal bleeding. Incisions have healed well. She can barely see them. One is a little tender.  She is having more hot flashes since about a week after her surgery.  Not having enough to desire treatment. Will consider if worsens.  She has been occasionally checking blood glucose levels and they have all been normal.    Observations/Objective:   Physical Exam could not be performed. Because of the COVID-19 outbreak this visit was performed over the phone and not in person.   Assessment and Plan: 59 yo s/p laparoscopic hysterectomy 4 weeks ago Doing well, continue current care.  Plan follow up at 12 weeks after her surgery for vaginal examination.   Follow Up Instructions:  12 weeks post op or as needed   I discussed the assessment and treatment plan with the patient. The patient was provided an opportunity to ask questions and all were answered. The patient agreed with the plan and demonstrated an understanding of the instructions.   The patient was advised to call back or seek an in-person evaluation if the symptoms worsen or if the condition fails to improve as  anticipated.  I provided 10 minutes of non-face-to-face time during this encounter.  Kaitlyn Prows MD [facility] 06/23/2019 2:20 PM

## 2019-07-01 ENCOUNTER — Other Ambulatory Visit: Payer: Self-pay

## 2019-07-01 ENCOUNTER — Encounter: Payer: Self-pay | Admitting: Plastic Surgery

## 2019-07-01 ENCOUNTER — Ambulatory Visit (INDEPENDENT_AMBULATORY_CARE_PROVIDER_SITE_OTHER): Payer: No Typology Code available for payment source | Admitting: Plastic Surgery

## 2019-07-01 VITALS — BP 157/92 | HR 84 | Temp 97.7°F | Ht 61.0 in | Wt 218.2 lb

## 2019-07-01 DIAGNOSIS — Z1509 Genetic susceptibility to other malignant neoplasm: Secondary | ICD-10-CM

## 2019-07-01 DIAGNOSIS — Z1501 Genetic susceptibility to malignant neoplasm of breast: Secondary | ICD-10-CM

## 2019-07-01 NOTE — Progress Notes (Signed)
Patient ID: [name], female    DOB: 11/21/1960, 59 y.o.   MRN: [id number]   Chief Complaint  Patient presents with  . Advice Only    for reconstruction  . Breast Problem    Patient is a 59 year old female here for evaluation of her breasts.  She underwent genetic testing which turned out to be positive for BRCA2.  She has a very long history of breast cancer and cancer in general.  When she was 79 she cared for her mother who had breast cancer and it was a very emotional time for her.  She is 5 feet 1 inch tall and weighs 218 pounds.  She weighed approximately 40 pounds more and has done very well to lose the weight in this past year.  She has multiple medical conditions including kidney stones, fibromyalgia, asthma, hypothyroidism and hypertension.  She is not ready to have mastectomies right now and is actually not 100% sure she wants to have them at all but she does not want to know what her options are.  Her current breast size is a 40 DDD.  She would like to be smaller.  I don't feel any concerning areas on her exam in general or breast exam.  She had a biopsy several years ago by Dr. Bary Castilla in [place]  Constitutional: Negative.  Negative for activity change and appetite change.  HENT: Negative.   Eyes: Negative.   Respiratory: Negative.  Negative for chest tightness.   Cardiovascular: Negative.   Gastrointestinal: Negative.  Negative for abdominal distention and abdominal pain.  Endocrine: Negative.   Genitourinary: Negative.   Musculoskeletal: Negative.   Hematological: Negative.   Psychiatric/Behavioral: Negative.     Past Medical History:  Diagnosis Date  . Allergic rhinitis   . Anemia   . Arthritis   . Asthma    well controlled  . Basal cell carcinoma   . BRCA2 gene mutation positive 12/2018   MyRisk BRCA 2 positive with special interpretation--mutation has less penetrance than usual BRCA 2 mutation  . Bursitis   . Complication of  anesthesia    thrashing and screaming after cholecystectomy  . Dermatitis, eczematoid 09/30/2011  . Duodenal ulcer   . Family history of breast cancer   . Fibromyalgia   . Gastritis   . GERD (gastroesophageal reflux disease)   . Headache    h/o migraines  . Heart murmur    asymptomatic  . Hiatal hernia   . History of kidney stones    h/o  . Hypertension   . Hypothyroidism   . IBS (irritable bowel syndrome)   . OSA (obstructive sleep apnea)    has CPAP-not using currently due to needing new mask as of 05-13-19  . Pneumonia 2009  . Psoriasis   . Rheumatic fever     Past Surgical History:  Procedure Laterality Date  . APPENDECTOMY    . BREAST BIOPSY Left 2007   benign, dr Bary Castilla  . CHOLECYSTECTOMY    . CYSTOSCOPY  05/19/2019   Procedure: CYSTOSCOPY;  Surgeon: Homero Fellers, MD;  Location: [facility] ORS;  Service: Gynecology;;  . HYSTEROSCOPY WITH D & C N/A 12/14/2018   Procedure: DILATATION AND CURETTAGE /HYSTEROSCOPY WITH MYOSURE, VULVAR BIOPSY;  Surgeon: Homero Fellers, MD;  Location: [facility] ORS;  Service: Gynecology;  Laterality: N/A;  . SKIN BIOPSY Left    Back on L leg   . SKIN CANCER EXCISION     BCCA  of face  . TONSILLECTOMY AND ADENOIDECTOMY    . TOTAL LAPAROSCOPIC HYSTERECTOMY WITH SALPINGECTOMY N/A 05/19/2019   Procedure: TOTAL LAPAROSCOPIC HYSTERECTOMY WITH SALPINGECTOMY/ TLH/BSO;  Surgeon: Homero Fellers, MD;  Location: [facility] ORS;  Service: Gynecology;  Laterality: N/A;      Current Outpatient Medications:  .  albuterol (PROVENTIL HFA;VENTOLIN HFA) 108 (90 BASE) MCG/ACT inhaler, Inhale 2 puffs into the lungs 4 (four) times daily as needed., Disp: 18 g, Rfl: 12 .  aspirin 81 MG EC tablet, Take 81 mg by mouth at bedtime. , Disp: , Rfl:  .  blood glucose meter kit and supplies KIT, Dispense based on patient and insurance preference. Use up to four times daily as directed. (FOR ICD-9 250.00, 250.01)., Disp: 1 each, Rfl: 0 .  BYSTOLIC 10 MG tablet, TAKE  1 TABLET BY MOUTH DAILY (Patient taking differently: Take 10 mg by mouth every evening. ), Disp: 90 tablet, Rfl: 1 .  cetirizine (ZYRTEC) 10 MG tablet, Take 10 mg by mouth every morning. , Disp: , Rfl:  .  cholestyramine light (PREVALITE) 4 g packet, MIX 1 PACKET AS DIRECTED AND DRINK ONCE DAILY, Disp: 90 packet, Rfl: 3 .  clobetasol ointment (TEMOVATE) 0.05 %, Apply to affected area every night for 4 weeks, then every other day for 4 weeks and then twice a week for 4 weeks or until resolution. (Patient taking differently: Apply 1 application topically as needed. Apply to affected area every night for 4 weeks, then every other day for 4 weeks and then twice a week for 4 weeks or until resolution.), Disp: 30 g, Rfl: 5 .  cyclobenzaprine (FLEXERIL) 5 MG tablet, TAKE 3 TABLETS BY MOUTH TWO TIMES DAILY, Disp: 540 tablet, Rfl: 0 .  diphenhydrAMINE (BENADRYL) 25 MG tablet, Take 25-50 mg by mouth every 6 (six) hours as needed (allergies/asthma)., Disp: , Rfl:  .  doxazosin (CARDURA) 2 MG tablet, TAKE 1 TABLET (2 MG TOTAL) BY MOUTH DAILY. (Patient taking differently: Take 2 mg by mouth at bedtime. ), Disp: 90 tablet, Rfl: 3 .  doxepin (SINEQUAN) 100 MG capsule, TAKE 1 CAPSULE BY MOUTH ONCE DAILY AT BEDTIME (Patient taking differently: Take 100 mg by mouth at bedtime. ), Disp: 90 capsule, Rfl: 3 .  esomeprazole (NEXIUM) 40 MG capsule, TAKE 1 CAPSULE (40 MG TOTAL) BY MOUTH AT BEDTIME. (Patient taking differently: Take 40 mg by mouth daily before supper. (1700)), Disp: 90 capsule, Rfl: 3 .  ibuprofen (ADVIL) 800 MG tablet, Take 1 tablet (800 mg total) by mouth every 8 (eight) hours as needed for mild pain or cramping., Disp: 30 tablet, Rfl: 1 .  levothyroxine (SYNTHROID) 200 MCG tablet, TAKE 1 TABLET (200 MCG TOTAL) BY MOUTH DAILY. (Patient taking differently: Take 200 mcg by mouth daily before breakfast. ), Disp: 90 tablet, Rfl: 3 .  lisinopril (ZESTRIL) 40 MG tablet, TAKE 1 TABLET BY MOUTH DAILY, Disp: 90 tablet,  Rfl: 3 .  loperamide (IMODIUM A-D) 2 MG tablet, Take 2-4 mg by mouth 4 (four) times daily as needed for diarrhea or loose stools. , Disp: , Rfl:  .  montelukast (SINGULAIR) 10 MG tablet, TAKE 1 TABLET BY MOUTH NIGHTLY AT BEDTIME (Patient taking differently: Take 10 mg by mouth at bedtime. ), Disp: 90 tablet, Rfl: 3 .  OVER THE COUNTER MEDICATION, Woman's multvitamin-Take 1 gummie daily., Disp: , Rfl:  .  tamoxifen (NOLVADEX) 20 MG tablet, Take 1 tablet (20 mg total) by mouth daily. (Patient taking differently: Take 20 mg by mouth  every morning. ), Disp: 30 tablet, Rfl: 11 .  traMADol (ULTRAM) 50 MG tablet, Take 50 mg by mouth every 6 (six) hours as needed for moderate pain., Disp: , Rfl:  .  traZODone (DESYREL) 50 MG tablet, Take 1-2 tablets (50-100 mg total) by mouth at bedtime as needed for sleep., Disp: 180 tablet, Rfl: 1 .  vitamin B-12 (CYANOCOBALAMIN) 1000 MCG tablet, Take 1500 mcg-2 gummie daily., Disp: , Rfl:    Objective:   Vitals:   07/01/19 1159  BP: (!) 157/92  Pulse: 84  Temp: 97.7 F (36.5 C)  SpO2: 95%    Physical Exam Vitals and nursing note reviewed.  Constitutional:      Appearance: Normal appearance.  HENT:     Head: Normocephalic and atraumatic.  Cardiovascular:     Rate and Rhythm: Normal rate.  Pulmonary:     Effort: Pulmonary effort is normal. No respiratory distress.  Abdominal:     General: Abdomen is flat. There is no distension.  Neurological:     General: No focal deficit present.     Mental Status: She is alert and oriented to person, place, and time.  Psychiatric:        Mood and Affect: Mood normal.        Behavior: Behavior normal.        Thought Content: Thought content normal.     Assessment & Plan:  BRCA2 gene mutation positive  We had a detailed conversation about the patient's options for breast reconstruction. Several reconstruction options were explained to the patient.  It is important to remember that breast reconstruction is an  optional procedure. Reconstruction often requires several stages of surgery and this means more than one operation.  The surgeries are often done several months apart.  The entire process from start to finish can take a year or more. The major goal of breast reconstruction is to look normal in clothing. There will always be scars and a difference noticeable without clothes.  This is true for asymmetries where both breasts will not be identical.  Surgery may be needed or desired to the non-cancerous breast in order to achieve better symmetry and satisfactory results.  Regardless of the reconstructive method, there is always risks and the possibility that the procedure will fail or have complications.  This couls required additional surgeries.    We discussed the available methods of breast reconstruction and included:  1. Tissue expander with Acellular dermal matrix followed by implant based reconstruction. This can be done as one surgery or multiple surgeries.  2. Autologous reconstruction can include using a muscle or tissue from another area of the body for the reconstruction.  3. Combined procedures like the latissismus dorsi flaps that often uses the muscle with an expander or implant.  For each of the method discussed the risks, benefits, scars and recovery time were discussed in detail. Specific risks included bleeding, infection, hematoma, seroma, scarring, pain, wound healing complications, flap loss, fat necrosis, capsular contracture, need for implant removal, donor site complications, bulge, hernia, umbilical necrosis, need for urgent reoperation, and need for dressing changes.   After the options were discussed we focused on the patient's desires and the procedure that was best for her based on all the information.  A total of 50 minutes of face-to-face time was spent in this encounter, of which >50% was spent in counseling.   She is going to think things over.  I also encouraged her to work on  decreasing her body  mass index.  This would make it more likely that she will have all options available to her for reconstruction if and when she decides to have the mastectomies  Pictures were obtained of the patient and placed in the chart with the patient's or guardian's permission.  Wallace Going, DO   The 21st Century Cures Act was signed into law in 2016 which includes the topic of electronic health records.  This provides immediate access to information in MyChart.  This includes consultation notes, operative notes, office notes, lab results and pathology reports.  If you have any questions about what you read please let us know at your next visit or call us at the office.  We are right here with you.

## 2019-07-09 ENCOUNTER — Other Ambulatory Visit: Payer: Self-pay | Admitting: Internal Medicine

## 2019-08-10 ENCOUNTER — Other Ambulatory Visit: Payer: Self-pay

## 2019-08-10 ENCOUNTER — Other Ambulatory Visit: Payer: Self-pay | Admitting: Obstetrics and Gynecology

## 2019-08-10 ENCOUNTER — Encounter: Payer: Self-pay | Admitting: Obstetrics and Gynecology

## 2019-08-10 ENCOUNTER — Ambulatory Visit (INDEPENDENT_AMBULATORY_CARE_PROVIDER_SITE_OTHER): Payer: No Typology Code available for payment source | Admitting: Obstetrics and Gynecology

## 2019-08-10 VITALS — BP 150/80 | Ht 61.0 in | Wt 222.0 lb

## 2019-08-10 DIAGNOSIS — B9689 Other specified bacterial agents as the cause of diseases classified elsewhere: Secondary | ICD-10-CM | POA: Diagnosis not present

## 2019-08-10 DIAGNOSIS — N760 Acute vaginitis: Secondary | ICD-10-CM

## 2019-08-10 DIAGNOSIS — Z1509 Genetic susceptibility to other malignant neoplasm: Secondary | ICD-10-CM

## 2019-08-10 DIAGNOSIS — Z1501 Genetic susceptibility to malignant neoplasm of breast: Secondary | ICD-10-CM

## 2019-08-10 DIAGNOSIS — N950 Postmenopausal bleeding: Secondary | ICD-10-CM

## 2019-08-10 DIAGNOSIS — Z298 Encounter for other specified prophylactic measures: Secondary | ICD-10-CM

## 2019-08-10 MED ORDER — TAMOXIFEN CITRATE 20 MG PO TABS: 20.0000 mg | ORAL_TABLET | Freq: Every day | ORAL | 3 refills | Status: DC

## 2019-08-10 MED ORDER — METRONIDAZOLE 500 MG PO TABS: 500.0000 mg | ORAL_TABLET | Freq: Two times a day (BID) | ORAL | 0 refills | Status: AC

## 2019-08-10 NOTE — Progress Notes (Signed)
  Postoperative Follow-up Patient presents post op from laparoscopic assisted vaginal hysterectomy bilateral salpingo-oophorectomy for BRCA2 +,  3 months ago.  Subjective: Patient reports marked improvement in her preop symptoms. Eating a regular diet without difficulty. The patient is not having any pain.  Activity: normal activities of daily living. Patient reports additional symptom's since surgery of foul, fishy thin discharge. She is also having worsening of her hot flashes.   Objective: BP (!) 150/80   Ht '5\' 1"'$  (1.549 m)   Wt 222 lb (100.7 kg)   BMI 41.95 kg/m  Physical Exam Constitutional:      Appearance: She is well-developed.  Genitourinary:     Vagina and uterus normal.     No lesions in the vagina.     No cervical motion tenderness.     No right or left adnexal mass present.     Genitourinary Comments: Well healed vaginal incision. Small amount of suture visible.  Intact on bimanual exam.  HENT:     Head: Normocephalic and atraumatic.  Neck:     Thyroid: No thyromegaly.  Cardiovascular:     Rate and Rhythm: Normal rate and regular rhythm.     Heart sounds: Normal heart sounds.  Pulmonary:     Effort: Pulmonary effort is normal.     Breath sounds: Normal breath sounds.  Chest:     Breasts:        Right: No inverted nipple, mass, nipple discharge or skin change.        Left: No inverted nipple, mass, nipple discharge or skin change.  Abdominal:     General: Bowel sounds are normal. There is no distension.     Palpations: Abdomen is soft. There is no mass.  Musculoskeletal:     Cervical back: Neck supple.  Neurological:     Mental Status: She is alert and oriented to person, place, and time.  Skin:    General: Skin is warm and dry.  Psychiatric:        Behavior: Behavior normal.        Thought Content: Thought content normal.        Judgment: Judgment normal.  Vitals reviewed.    Assessment: s/p :laparoscopic assisted vaginal hysterectomy bilateral  salpingo-oophorectomy for BRCA2 +  Plan: Patient has done well after surgery with no apparent complications.  I have discussed the post-operative course to date, and the expected progress moving forward.  The patient understands what complications to be concerned about.  I will see the patient in routine follow up, or sooner if needed.    Discussed options with the patient for management of hot flashes.  Can consider therapy with gabapentin in the future if desired.  Will treat for bacterial vaginosis Rx sent for Flagyl.  Modification of surgical admission orders for inpatient surgery attempted in the medical record.  Activity plan: No restriction.  Joylyn Duggin R Liat Mayol 08/10/2019, 2:08 PM

## 2019-09-02 ENCOUNTER — Ambulatory Visit: Payer: No Typology Code available for payment source | Attending: Internal Medicine

## 2019-09-02 DIAGNOSIS — Z23 Encounter for immunization: Secondary | ICD-10-CM

## 2019-09-02 NOTE — Progress Notes (Signed)
   Covid-19 Vaccination Clinic  Name:  [name]    MRN: [id number] DOB: [birth year]  09/02/2019  [name] was observed post Covid-19 immunization for 15 minutes without incident. She was provided with Vaccine Information Sheet and instruction to access the V-Safe system.   [name] was instructed to call 911 with any severe reactions post vaccine: Marland Kitchen Difficulty breathing  . Swelling of face and throat  . A fast heartbeat  . A bad rash all over body  . Dizziness and weakness   Immunizations Administered    Name Date Dose VIS Date Route   Pfizer COVID-19 Vaccine 09/02/2019 12:05 PM 0.3 mL 05/20/2019 Intramuscular   Manufacturer: [place]   Lot: [id number]   [place]: [id number]

## 2019-09-05 ENCOUNTER — Other Ambulatory Visit: Payer: Self-pay | Admitting: Internal Medicine

## 2019-09-14 ENCOUNTER — Other Ambulatory Visit: Payer: Self-pay | Admitting: Internal Medicine

## 2019-09-28 ENCOUNTER — Ambulatory Visit: Payer: No Typology Code available for payment source | Attending: Internal Medicine

## 2019-09-28 DIAGNOSIS — Z23 Encounter for immunization: Secondary | ICD-10-CM

## 2019-09-28 NOTE — Progress Notes (Signed)
   Covid-19 Vaccination Clinic  Name:  KACY PROVO    MRN: [id number] DOB: 1960-07-16  09/28/2019  Ms. Secker was observed post Covid-19 immunization for 30 minutes based on pre-vaccination screening without incident. She was provided with Vaccine Information Sheet and instruction to access the V-Safe system.   Ms. Bishara was instructed to call 911 with any severe reactions post vaccine: Marland Kitchen Difficulty breathing  . Swelling of face and throat  . A fast heartbeat  . A bad rash all over body  . Dizziness and weakness   Immunizations Administered    Name Date Dose VIS Date Route   Pfizer COVID-19 Vaccine 09/28/2019 11:34 AM 0.3 mL 08/03/2018 Intramuscular   Manufacturer: [place]   Lot: [id number]   [place]: [id number]

## 2019-09-29 ENCOUNTER — Other Ambulatory Visit: Payer: Self-pay | Admitting: Internal Medicine

## 2019-09-29 NOTE — Telephone Encounter (Signed)
Requested medication (s) are due for refill today: yes  Requested medication (s) are on the active medication list: yes  Last refill:  02/04/2019  Future visit scheduled: yes  Notes to clinic: not delegated; historical provider    Requested Prescriptions  Pending Prescriptions Disp Refills   traMADol (ULTRAM) 50 MG tablet [Pharmacy Med Name: traMADol HCL 50 MG TABS 50 Tablet] 60 tablet 0    Sig: TAKE 1 TABLET BY MOUTH EVERY 12 HOURS AS NEEDED      Not Delegated - Analgesics:  Opioid Agonists Failed - 09/29/2019  7:32 AM      Failed - This refill cannot be delegated      Failed - Urine Drug Screen completed in last 360 days.      Passed - Valid encounter within last 6 months    Recent Outpatient Visits           4 months ago Essential (primary) hypertension   [facility] Glean Hess, MD   10 months ago Annual physical exam   [facility] Glean Hess, MD   1 year ago Strain of neck muscle, initial encounter   [facility] Glean Hess, MD   1 year ago Essential (primary) hypertension   [facility] Glean Hess, MD   1 year ago Fibromyalgia syndrome   [facility] Glean Hess, MD       Future Appointments             In 1 month Army Melia Jesse Sans, MD [facility]

## 2019-11-09 ENCOUNTER — Other Ambulatory Visit: Payer: Self-pay | Admitting: Internal Medicine

## 2019-11-21 ENCOUNTER — Other Ambulatory Visit: Payer: Self-pay | Admitting: Internal Medicine

## 2019-11-21 ENCOUNTER — Encounter: Payer: Self-pay | Admitting: Internal Medicine

## 2019-11-21 ENCOUNTER — Ambulatory Visit (INDEPENDENT_AMBULATORY_CARE_PROVIDER_SITE_OTHER): Payer: No Typology Code available for payment source | Admitting: Internal Medicine

## 2019-11-21 ENCOUNTER — Other Ambulatory Visit: Payer: Self-pay

## 2019-11-21 VITALS — BP 138/90 | HR 75 | Temp 98.1°F | Ht 61.0 in | Wt 226.0 lb

## 2019-11-21 DIAGNOSIS — Z0000 Encounter for general adult medical examination without abnormal findings: Secondary | ICD-10-CM | POA: Diagnosis not present

## 2019-11-21 DIAGNOSIS — Z1509 Genetic susceptibility to other malignant neoplasm: Secondary | ICD-10-CM

## 2019-11-21 DIAGNOSIS — I10 Essential (primary) hypertension: Secondary | ICD-10-CM

## 2019-11-21 DIAGNOSIS — R7303 Prediabetes: Secondary | ICD-10-CM

## 2019-11-21 DIAGNOSIS — Z6841 Body Mass Index (BMI) 40.0 and over, adult: Secondary | ICD-10-CM | POA: Diagnosis not present

## 2019-11-21 DIAGNOSIS — Z1501 Genetic susceptibility to malignant neoplasm of breast: Secondary | ICD-10-CM

## 2019-11-21 DIAGNOSIS — K277 Chronic peptic ulcer, site unspecified, without hemorrhage or perforation: Secondary | ICD-10-CM

## 2019-11-21 DIAGNOSIS — Z1231 Encounter for screening mammogram for malignant neoplasm of breast: Secondary | ICD-10-CM

## 2019-11-21 DIAGNOSIS — Z23 Encounter for immunization: Secondary | ICD-10-CM

## 2019-11-21 DIAGNOSIS — E039 Hypothyroidism, unspecified: Secondary | ICD-10-CM | POA: Diagnosis not present

## 2019-11-21 DIAGNOSIS — E782 Mixed hyperlipidemia: Secondary | ICD-10-CM | POA: Diagnosis not present

## 2019-11-21 DIAGNOSIS — J4520 Mild intermittent asthma, uncomplicated: Secondary | ICD-10-CM

## 2019-11-21 DIAGNOSIS — G4733 Obstructive sleep apnea (adult) (pediatric): Secondary | ICD-10-CM

## 2019-11-21 LAB — POCT URINALYSIS DIPSTICK
Bilirubin, UA: NEGATIVE
Blood, UA: NEGATIVE
Glucose, UA: NEGATIVE
Ketones, UA: NEGATIVE
Leukocytes, UA: NEGATIVE
Nitrite, UA: NEGATIVE
Protein, UA: NEGATIVE
Spec Grav, UA: 1.015 (ref 1.010–1.025)
Urobilinogen, UA: 0.2 E.U./dL
pH, UA: 6 (ref 5.0–8.0)

## 2019-11-21 MED ORDER — SHINGRIX 50 MCG/0.5ML IM SUSR: 0.5000 mL | Freq: Once | INTRAMUSCULAR | 1 refills | Status: DC

## 2019-11-21 MED ORDER — FLUTICASONE PROPIONATE 50 MCG/ACT NA SUSP: 2.0000 | Freq: Every day | NASAL | 6 refills | Status: DC

## 2019-11-21 MED ORDER — DOXAZOSIN MESYLATE 2 MG PO TABS: 2.0000 mg | ORAL_TABLET | Freq: Every day | ORAL | 1 refills | Status: DC

## 2019-11-21 NOTE — Progress Notes (Signed)
Date:  11/21/2019   Name:  [name]   DOB:  1961/01/16   MRN:  [id number]   Chief Complaint: Annual Exam (Breast Exam. No pap- 2 more years. Had to stop taking trazodone due to taking tamoxifen. Wants to know if there is an alternative. ) and Asthma (Benadryl helps stops asthma attacks. Wants to know if there is an alternative for this as well. ) [name] is a 59 y.o. female who presents today for her Complete Annual Exam. She feels fairly well. She reports exercising none at this time. She reports she is sleeping poorly. Had to stop trazodone due to possible interaction with tamoxifen.    Mammogram  12/2018 Pap discontinued Colonoscopy  06/2012 repeat 2024 Immunization History  Administered Date(s) Administered  . Influenza,inj,Quad PF,6+ Mos 03/19/2015, 03/02/2017, 07/19/2018, 05/13/2019  . PFIZER SARS-COV-2 Vaccination 09/02/2019, 09/28/2019  . Pneumococcal Conjugate-13 01/17/2016  . Pneumococcal Polysaccharide-23 06/10/2010    Hypertension This is a chronic problem. The problem is controlled. Associated symptoms include headaches and shortness of breath. Pertinent negatives include no chest pain or palpitations. Identifiable causes of hypertension include a thyroid problem.  Hyperlipidemia Associated symptoms include shortness of breath. Pertinent negatives include no chest pain.  Thyroid Problem Presents for follow-up visit. Symptoms include diarrhea. Patient reports no anxiety, constipation, fatigue, palpitations or tremors. Her past medical history is significant for hyperlipidemia.  Diabetes She presents for her follow-up diabetic visit. Diabetes type: prediabetes. Hypoglycemia symptoms include headaches. Pertinent negatives for hypoglycemia include no dizziness, nervousness/anxiousness or tremors. Pertinent negatives for diabetes include no chest pain, no fatigue, no polydipsia and no polyuria. Symptoms are stable. She monitors blood glucose at home 1-2 x per week.  Her home blood glucose trend is decreasing steadily. Her breakfast blood glucose is taken between 7-8 am. Her breakfast blood glucose range is generally 110-130 mg/dl. An ACE inhibitor/angiotensin II receptor blocker is being taken.    Lab Results  Component Value Date   CREATININE 0.53 05/13/2019   BUN 15 05/13/2019   NA 141 05/13/2019   K 4.0 05/13/2019   CL 102 05/13/2019   CO2 28 05/13/2019   Lab Results  Component Value Date   CHOL 200 (H) 11/18/2018   HDL 49 11/18/2018   LDLCALC 114 (H) 11/18/2018   TRIG 183 (H) 11/18/2018   CHOLHDL 4.1 11/18/2018   Lab Results  Component Value Date   TSH 3.530 11/18/2018   Lab Results  Component Value Date   HGBA1C 6.1 (H) 05/13/2019   Lab Results  Component Value Date   WBC 17.0 (H) 05/19/2019   HGB 12.6 05/19/2019   HCT 37.4 05/19/2019   MCV 92.6 05/19/2019   PLT 293 05/19/2019   Lab Results  Component Value Date   ALT 15 11/18/2018   AST 21 11/18/2018   ALKPHOS 101 11/18/2018   BILITOT 0.4 11/18/2018     Review of Systems  Constitutional: Negative for chills, fatigue and fever.  HENT: Positive for sinus pain (Sinus headaches on and off). Negative for congestion, hearing loss, tinnitus, trouble swallowing and voice change.   Eyes: Negative for visual disturbance.  Respiratory: Positive for chest tightness (Asthma with activity.) and shortness of breath. Negative for cough and wheezing.   Cardiovascular: Negative for chest pain, palpitations and leg swelling.  Gastrointestinal: Positive for abdominal pain and diarrhea. Negative for constipation and vomiting.  Endocrine: Negative for polydipsia and polyuria.  Genitourinary: Negative for dysuria, frequency, genital sores, vaginal bleeding and vaginal discharge.  Musculoskeletal: Positive for arthralgias. Negative for gait problem and joint swelling.  Skin: Negative for color change and rash.  Neurological: Positive for headaches. Negative for dizziness, tremors and  light-headedness.  Hematological: Negative for adenopathy. Does not bruise/bleed easily.  Psychiatric/Behavioral: Positive for sleep disturbance. Negative for dysphoric mood. The patient is not nervous/anxious.     Patient Active Problem List   Diagnosis Date Noted  . S/P hysterectomy with oophorectomy 05/19/2019  . BRCA2 gene mutation positive 03/03/2019  . Pre-diabetes 09/16/2016  . Insomnia 09/16/2016  . Arthritis of carpometacarpal (CMC) joint of thumb 05/29/2016  . Venous insufficiency of both lower extremities 01/17/2016  . Acquired hypothyroidism 11/10/2014  . Essential (primary) hypertension 11/10/2014  . Fibromyalgia syndrome 11/10/2014  . Mixed hyperlipidemia 11/10/2014  . Irritable bowel syndrome with diarrhea 11/10/2014  . Asthma, mild intermittent 11/10/2014  . BMI 40.0-44.9, adult ([place]) 11/10/2014  . Chronic peptic ulcer 11/10/2014  . Psoriasis 11/10/2014  . Obstructive apnea 11/10/2014  . Chest pain 07/19/2012  . Environmental and seasonal allergies 09/08/2011    Allergies  Allergen Reactions  . Sulfa Antibiotics Other (See Comments)    seizure  . Amlodipine Swelling    Leg swelling  . Furosemide Swelling  . Lyrica [Pregabalin] Other (See Comments)    Dizziness and blurred vision  . Hydrochlorothiazide Itching and Rash    Past Surgical History:  Procedure Laterality Date  . APPENDECTOMY    . BREAST BIOPSY Left 2007   benign, dr Bary Castilla  . CHOLECYSTECTOMY    . CYSTOSCOPY  05/19/2019   Procedure: CYSTOSCOPY;  Surgeon: Homero Fellers, MD;  Location: [facility] ORS;  Service: Gynecology;;  . HYSTEROSCOPY WITH D & C N/A 12/14/2018   Procedure: DILATATION AND CURETTAGE /HYSTEROSCOPY WITH MYOSURE, VULVAR BIOPSY;  Surgeon: Homero Fellers, MD;  Location: [facility] ORS;  Service: Gynecology;  Laterality: N/A;  . SKIN BIOPSY Left    Back on L leg   . SKIN CANCER EXCISION     BCCA of face  . TONSILLECTOMY AND ADENOIDECTOMY    . TOTAL LAPAROSCOPIC HYSTERECTOMY  WITH SALPINGECTOMY N/A 05/19/2019   Procedure: TOTAL LAPAROSCOPIC HYSTERECTOMY WITH SALPINGECTOMY/ TLH/BSO;  Surgeon: Homero Fellers, MD;  Location: [facility] ORS;  Service: Gynecology;  Laterality: N/A;    Social History   Tobacco Use  . Smoking status: Never Smoker  . Smokeless tobacco: Never Used  Vaping Use  . Vaping Use: Never used  Substance Use Topics  . Alcohol use: Yes    Alcohol/week: 2.0 standard drinks    Types: 2 Standard drinks or equivalent per week    Comment: rarely  . Drug use: No     Medication list has been reviewed and updated.  Current Meds  Medication Sig  . albuterol (PROVENTIL HFA;VENTOLIN HFA) 108 (90 BASE) MCG/ACT inhaler Inhale 2 puffs into the lungs 4 (four) times daily as needed.  Marland Kitchen aspirin 81 MG EC tablet Take 81 mg by mouth at bedtime.   . blood glucose meter kit and supplies KIT Dispense based on patient and insurance preference. Use up to four times daily as directed. (FOR ICD-9 250.00, 250.01).  . BYSTOLIC 10 MG tablet TAKE 1 TABLET BY MOUTH DAILY  . cetirizine (ZYRTEC) 10 MG tablet Take 10 mg by mouth every morning.   . cholestyramine light (PREVALITE) 4 g packet MIX 1 PACKET AS DIRECTED AND DRINK ONCE DAILY  . clobetasol ointment (TEMOVATE) 0.05 % Apply to affected area every night for 4 weeks, then every other day for  4 weeks and then twice a week for 4 weeks or until resolution. (Patient taking differently: Apply 1 application topically as needed. Apply to affected area every night for 4 weeks, then every other day for 4 weeks and then twice a week for 4 weeks or until resolution.)  . cyclobenzaprine (FLEXERIL) 5 MG tablet TAKE 3 TABLETS BY MOUTH TWO TIMES DAILY  . diphenhydrAMINE (BENADRYL) 25 MG tablet Take 25-50 mg by mouth every 6 (six) hours as needed (allergies/asthma).  . doxazosin (CARDURA) 2 MG tablet TAKE 1 TABLET (2 MG TOTAL) BY MOUTH DAILY.  Marland Kitchen doxepin (SINEQUAN) 100 MG capsule TAKE 1 CAPSULE BY MOUTH ONCE DAILY AT BEDTIME  .  esomeprazole (NEXIUM) 40 MG capsule Take 1 capsule (40 mg total) by mouth daily before supper. (1700)  . FREESTYLE LITE test strip   . ibuprofen (ADVIL) 800 MG tablet Take 1 tablet (800 mg total) by mouth every 8 (eight) hours as needed for mild pain or cramping.  . Lancets (FREESTYLE) lancets 1 each 4 (four) times daily.  Marland Kitchen levothyroxine (SYNTHROID) 200 MCG tablet TAKE 1 TABLET (200 MCG TOTAL) BY MOUTH DAILY. (Patient taking differently: Take 200 mcg by mouth daily before breakfast. )  . lisinopril (ZESTRIL) 40 MG tablet TAKE 1 TABLET BY MOUTH DAILY  . loperamide (IMODIUM A-D) 2 MG tablet Take 2-4 mg by mouth 4 (four) times daily as needed for diarrhea or loose stools.   . meloxicam (MOBIC) 15 MG tablet Take 15 mg by mouth daily.  . montelukast (SINGULAIR) 10 MG tablet TAKE 1 TABLET BY MOUTH NIGHTLY AT BEDTIME  . OVER THE COUNTER MEDICATION Woman's multvitamin-Take 1 gummie daily.  . tamoxifen (NOLVADEX) 20 MG tablet Take 1 tablet (20 mg total) by mouth daily.  . traMADol (ULTRAM) 50 MG tablet TAKE 1 TABLET BY MOUTH EVERY 12 HOURS AS NEEDED  . vitamin B-12 (CYANOCOBALAMIN) 1000 MCG tablet Take 1500 mcg-2 gummie daily.    PHQ 2/9 Scores 11/21/2019 05/13/2019 11/18/2018 07/19/2018  PHQ - 2 Score 2 0 0 4  PHQ- 9 Score 8 - 2 11    GAD 7 : Generalized Anxiety Score 11/21/2019  Nervous, Anxious, on Edge 0  Control/stop worrying 0  Worry too much - different things 0  Trouble relaxing 0  Restless 0  Easily annoyed or irritable 0  Afraid - awful might happen 0  Total GAD 7 Score 0  Anxiety Difficulty Not difficult at all    BP Readings from Last 3 Encounters:  11/21/19 138/90  08/10/19 (!) 150/80  07/01/19 (!) 157/92    Physical Exam Vitals and nursing note reviewed.  Constitutional:      General: She is not in acute distress.    Appearance: She is well-developed. She is obese.  HENT:     Head: Normocephalic and atraumatic.     Right Ear: Tympanic membrane and ear canal normal.      Left Ear: Tympanic membrane and ear canal normal.     Nose:     Right Sinus: No maxillary sinus tenderness.     Left Sinus: No maxillary sinus tenderness.  Eyes:     General: No scleral icterus.       Right eye: No discharge.        Left eye: No discharge.     Conjunctiva/sclera: Conjunctivae normal.  Neck:     Thyroid: No thyromegaly.     Vascular: No carotid bruit.  Cardiovascular:     Rate and Rhythm: Normal rate and regular  rhythm.     Pulses: Normal pulses.     Heart sounds: Normal heart sounds.  Pulmonary:     Effort: Pulmonary effort is normal. No respiratory distress.     Breath sounds: No wheezing.  Chest:     Breasts:        Right: No mass, nipple discharge, skin change or tenderness.        Left: No mass, nipple discharge, skin change or tenderness.  Abdominal:     General: Bowel sounds are normal.     Palpations: Abdomen is soft.     Tenderness: There is no abdominal tenderness.  Musculoskeletal:        General: Normal range of motion.     Cervical back: Normal range of motion. No erythema.  Lymphadenopathy:     Cervical: No cervical adenopathy.  Skin:    General: Skin is warm and dry.     Findings: No rash.  Neurological:     Mental Status: She is alert and oriented to person, place, and time.     Cranial Nerves: No cranial nerve deficit.     Sensory: No sensory deficit.     Deep Tendon Reflexes: Reflexes are normal and symmetric.  Psychiatric:        Speech: Speech normal.        Behavior: Behavior normal.        Thought Content: Thought content normal.     Wt Readings from Last 3 Encounters:  11/21/19 226 lb (102.5 kg)  08/10/19 222 lb (100.7 kg)  07/01/19 218 lb 3.2 oz (99 kg)    BP 138/90 (BP Location: Right Arm, Patient Position: Sitting, Cuff Size: Large)   Pulse 75   Temp 98.1 F (36.7 C) (Oral)   Ht '5\' 1"'$  (1.549 m)   Wt 226 lb (102.5 kg)   SpO2 95%   BMI 42.70 kg/m   Assessment and Plan: 1. Annual physical exam Continue diet  efforts and exercise as able Can take Trazodone for sleep - no interaction with Tamoxifen found - POCT urinalysis dipstick  2. Encounter for screening mammogram for breast cancer Oncology recommend a breast MRI due to BRAC2 gene mutation She will contact Onc or GYN  3. Essential (primary) hypertension Clinically stable exam with well controlled BP on bystolic and cardura. Tolerating medications without side effects at this time. Pt to continue current regimen and low sodium diet; benefits of regular exercise as able discussed. - CBC with Differential/Platelet - Comprehensive metabolic panel - doxazosin (CARDURA) 2 MG tablet; Take 1 tablet (2 mg total) by mouth daily.  Dispense: 90 tablet; Refill: 1  4. Pre-diabetes BS have been checked intermittently and are borderline around 110-120. Will check A1C to rule out DM - Hemoglobin A1c  5. Mixed hyperlipidemia - Lipid panel  6. Acquired hypothyroidism supplemented - TSH + free T4  7. BMI 40.0-44.9, adult ([place])  8. BRCA2 gene mutation positive Seen by GYN and Oncology  9. Chronic peptic ulcer Symptoms well controlled on daily PPI No red flag signs such as weight loss, n/v, melena Will continue nexium.  10. Obstructive apnea Has not been using CPAP for some time. Now has daytime sleepiness, poor sleep during the night, AM headaches Advised she will need a new sleep study - Ambulatory referral to Sleep Studies  11. Need for shingles vaccine First dose today - Varicella-zoster vaccine IM  12. Mild intermittent asthma without complication Most sx triggered by allergies and drainage Will add Flonase spray She uses  albuterol intermittently Can continue to use Benadryl PRN   Partially dictated using Editor, commissioning. Any errors are unintentional.  Halina Maidens, MD [facility]  11/21/2019

## 2019-11-21 NOTE — Telephone Encounter (Signed)
Requested Prescriptions  Pending Prescriptions Disp Refills  . doxazosin (CARDURA) 2 MG tablet [Pharmacy Med Name: DOXAZOSIN MESYLATE 2 MG TAB 2 Tablet] 90 tablet 3    Sig: TAKE 1 TABLET (2 MG TOTAL) BY MOUTH DAILY.     Cardiovascular:  Alpha Blockers Failed - 11/21/2019  7:29 AM      Failed - Last BP in normal range    BP Readings from Last 1 Encounters:  08/10/19 (!) 150/80         Failed - Valid encounter within last 6 months    Recent Outpatient Visits          6 months ago Essential (primary) hypertension   [facility] Glean Hess, MD   1 year ago Annual physical exam   [facility] Glean Hess, MD   1 year ago Strain of neck muscle, initial encounter   [facility] Glean Hess, MD   1 year ago Essential (primary) hypertension   [facility] Glean Hess, MD   1 year ago Fibromyalgia syndrome   [facility] Glean Hess, MD      Future Appointments            Today Glean Hess, MD [facility], [place]

## 2019-11-22 LAB — CBC WITH DIFFERENTIAL/PLATELET
Basophils Absolute: 0.1 10*3/uL (ref 0.0–0.2)
Basos: 1 %
EOS (ABSOLUTE): 0.3 10*3/uL (ref 0.0–0.4)
Eos: 3 %
Hematocrit: 39.7 % (ref 34.0–46.6)
Hemoglobin: 13.4 g/dL (ref 11.1–15.9)
Immature Grans (Abs): 0.1 10*3/uL (ref 0.0–0.1)
Immature Granulocytes: 1 %
Lymphocytes Absolute: 3.7 10*3/uL — ABNORMAL HIGH (ref 0.7–3.1)
Lymphs: 38 %
MCH: 31.5 pg (ref 26.6–33.0)
MCHC: 33.8 g/dL (ref 31.5–35.7)
MCV: 93 fL (ref 79–97)
Monocytes Absolute: 0.5 10*3/uL (ref 0.1–0.9)
Monocytes: 5 %
Neutrophils Absolute: 5 10*3/uL (ref 1.4–7.0)
Neutrophils: 52 %
Platelets: 345 10*3/uL (ref 150–450)
RBC: 4.25 x10E6/uL (ref 3.77–5.28)
RDW: 12 % (ref 11.7–15.4)
WBC: 9.6 10*3/uL (ref 3.4–10.8)

## 2019-11-22 LAB — COMPREHENSIVE METABOLIC PANEL
ALT: 17 IU/L (ref 0–32)
AST: 20 IU/L (ref 0–40)
Albumin/Globulin Ratio: 1.5 (ref 1.2–2.2)
Albumin: 3.7 g/dL — ABNORMAL LOW (ref 3.8–4.9)
Alkaline Phosphatase: 82 IU/L (ref 48–121)
BUN/Creatinine Ratio: 21 (ref 12–28)
BUN: 11 mg/dL (ref 8–27)
Bilirubin Total: 0.4 mg/dL (ref 0.0–1.2)
CO2: 25 mmol/L (ref 20–29)
Calcium: 8.9 mg/dL (ref 8.7–10.3)
Chloride: 102 mmol/L (ref 96–106)
Creatinine, Ser: 0.53 mg/dL — ABNORMAL LOW (ref 0.57–1.00)
GFR calc Af Amer: 119 mL/min/{1.73_m2} (ref >59)
GFR calc non Af Amer: 104 mL/min/{1.73_m2} (ref >59)
Globulin, Total: 2.5 g/dL (ref 1.5–4.5)
Glucose: 109 mg/dL — ABNORMAL HIGH (ref 65–99)
Potassium: 4 mmol/L (ref 3.5–5.2)
Sodium: 141 mmol/L (ref 134–144)
Total Protein: 6.2 g/dL (ref 6.0–8.5)

## 2019-11-22 LAB — LIPID PANEL
Chol/HDL Ratio: 3.8 ratio (ref 0.0–4.4)
Cholesterol, Total: 185 mg/dL (ref 100–199)
HDL: 49 mg/dL (ref >39)
LDL Chol Calc (NIH): 100 mg/dL — ABNORMAL HIGH (ref 0–99)
Triglycerides: 212 mg/dL — ABNORMAL HIGH (ref 0–149)
VLDL Cholesterol Cal: 36 mg/dL (ref 5–40)

## 2019-11-22 LAB — HEMOGLOBIN A1C
Est. average glucose Bld gHb Est-mCnc: 128 mg/dL
Hgb A1c MFr Bld: 6.1 % — ABNORMAL HIGH (ref 4.8–5.6)

## 2019-11-22 LAB — TSH+FREE T4
Free T4: 1.4 ng/dL (ref 0.82–1.77)
TSH: 2.87 u[IU]/mL (ref 0.450–4.500)

## 2019-12-05 ENCOUNTER — Other Ambulatory Visit: Payer: Self-pay | Admitting: Internal Medicine

## 2019-12-05 NOTE — Telephone Encounter (Signed)
Requested medication (s) are due for refill today: yes  Requested medication (s) are on the active medication list: yes  Last refill:  09/05/19  Future visit scheduled: yes  Notes to clinic: historical provider    Requested Prescriptions  Pending Prescriptions Disp Refills   meloxicam (MOBIC) 15 MG tablet [Pharmacy Med Name: MELOXICAM 15 MG TABLET 15 Tablet] 90 tablet 3    Sig: TAKE 1 TABLET BY MOUTH DAILY.      Analgesics:  COX2 Inhibitors Failed - 12/05/2019  7:58 AM      Failed - Cr in normal range and within 360 days    Creatinine  Date Value Ref Range Status  08/21/2013 0.66 0.60 - 1.30 mg/dL Final   Creatinine, Ser  Date Value Ref Range Status  11/21/2019 0.53 (L) 0.57 - 1.00 mg/dL Final          Passed - HGB in normal range and within 360 days    Hemoglobin  Date Value Ref Range Status  11/21/2019 13.4 11.1 - 15.9 g/dL Final          Passed - Patient is not pregnant      Passed - Valid encounter within last 12 months    Recent Outpatient Visits           2 weeks ago Annual physical exam   [facility] Glean Hess, MD   6 months ago Essential (primary) hypertension   [facility] Glean Hess, MD   1 year ago Annual physical exam   [facility] Glean Hess, MD   1 year ago Strain of neck muscle, initial encounter   [facility] Glean Hess, MD   1 year ago Essential (primary) hypertension   [facility] Glean Hess, MD       Future Appointments             In 5 months Army Melia Jesse Sans, MD [facility]             Signed Prescriptions Disp Refills   levothyroxine (SYNTHROID) 200 MCG tablet 90 tablet 3    Sig: TAKE 1 TABLET (200 MCG TOTAL) BY MOUTH DAILY.      Endocrinology:  Hypothyroid Agents Failed - 12/05/2019  7:58 AM      Failed - TSH needs to be rechecked within 3 months after an abnormal result. Refill until TSH is due.      Passed - TSH in normal  range and within 360 days    TSH  Date Value Ref Range Status  11/21/2019 2.870 0.450 - 4.500 uIU/mL Final          Passed - Valid encounter within last 12 months    Recent Outpatient Visits           2 weeks ago Annual physical exam   [facility] Glean Hess, MD   6 months ago Essential (primary) hypertension   [facility] Glean Hess, MD   1 year ago Annual physical exam   [facility] Glean Hess, MD   1 year ago Strain of neck muscle, initial encounter   [facility] Glean Hess, MD   1 year ago Essential (primary) hypertension   [facility] Glean Hess, MD       Future Appointments             In 5 months Army Melia Jesse Sans, MD [facility], [facility]

## 2019-12-05 NOTE — Telephone Encounter (Signed)
Requested Prescriptions  Pending Prescriptions Disp Refills  . meloxicam (MOBIC) 15 MG tablet [Pharmacy Med Name: MELOXICAM 15 MG TABLET 15 Tablet] 90 tablet 3    Sig: TAKE 1 TABLET BY MOUTH DAILY.     Analgesics:  COX2 Inhibitors Failed - 12/05/2019  7:58 AM      Failed - Cr in normal range and within 360 days    Creatinine  Date Value Ref Range Status  08/21/2013 0.66 0.60 - 1.30 mg/dL Final   Creatinine, Ser  Date Value Ref Range Status  11/21/2019 0.53 (L) 0.57 - 1.00 mg/dL Final         Passed - HGB in normal range and within 360 days    Hemoglobin  Date Value Ref Range Status  11/21/2019 13.4 11.1 - 15.9 g/dL Final         Passed - Patient is not pregnant      Passed - Valid encounter within last 12 months    Recent Outpatient Visits          2 weeks ago Annual physical exam   [facility] Glean Hess, MD   6 months ago Essential (primary) hypertension   [facility] Glean Hess, MD   1 year ago Annual physical exam   [facility] Glean Hess, MD   1 year ago Strain of neck muscle, initial encounter   [facility] Glean Hess, MD   1 year ago Essential (primary) hypertension   [facility] Glean Hess, MD      Future Appointments            In 5 months Glean Hess, MD [facility], [facility]           . levothyroxine (SYNTHROID) 200 MCG tablet [Pharmacy Med Name: LEVOTHYROXINE 200 MCG TAB 200 Tablet] 90 tablet 3    Sig: TAKE 1 TABLET (200 MCG TOTAL) BY MOUTH DAILY.     Endocrinology:  Hypothyroid Agents Failed - 12/05/2019  7:58 AM      Failed - TSH needs to be rechecked within 3 months after an abnormal result. Refill until TSH is due.      Passed - TSH in normal range and within 360 days    TSH  Date Value Ref Range Status  11/21/2019 2.870 0.450 - 4.500 uIU/mL Final         Passed - Valid encounter within last 12 months    Recent Outpatient Visits          2 weeks  ago Annual physical exam   [facility] Glean Hess, MD   6 months ago Essential (primary) hypertension   [facility] Glean Hess, MD   1 year ago Annual physical exam   [facility] Glean Hess, MD   1 year ago Strain of neck muscle, initial encounter   [facility] Glean Hess, MD   1 year ago Essential (primary) hypertension   [facility] Glean Hess, MD      Future Appointments            In 5 months Army Melia Jesse Sans, MD [facility], [facility]

## 2020-02-15 ENCOUNTER — Other Ambulatory Visit: Payer: Self-pay

## 2020-02-15 ENCOUNTER — Ambulatory Visit (INDEPENDENT_AMBULATORY_CARE_PROVIDER_SITE_OTHER): Payer: No Typology Code available for payment source

## 2020-02-15 DIAGNOSIS — Z23 Encounter for immunization: Secondary | ICD-10-CM

## 2020-03-05 ENCOUNTER — Other Ambulatory Visit: Payer: Self-pay | Admitting: Internal Medicine

## 2020-03-05 NOTE — Telephone Encounter (Signed)
Requested Prescriptions  Pending Prescriptions Disp Refills  . BYSTOLIC 10 MG tablet [Pharmacy Med Name: BYSTOLIC 10 MG TABLET 10 Tablet] 90 tablet 1    Sig: TAKE 1 TABLET BY MOUTH DAILY     Cardiovascular:  Beta Blockers Failed - 03/05/2020  7:42 AM      Failed - Last BP in normal range    BP Readings from Last 1 Encounters:  11/21/19 138/90         Passed - Last Heart Rate in normal range    Pulse Readings from Last 1 Encounters:  11/21/19 75         Passed - Valid encounter within last 6 months    Recent Outpatient Visits          3 months ago Annual physical exam   [facility] Glean Hess, MD   9 months ago Essential (primary) hypertension   [facility] Glean Hess, MD   1 year ago Annual physical exam   [facility] Glean Hess, MD   1 year ago Strain of neck muscle, initial encounter   [facility] Glean Hess, MD   1 year ago Essential (primary) hypertension   [facility] Glean Hess, MD      Future Appointments            In 2 months Army Melia Jesse Sans, MD [facility], [facility]

## 2020-03-08 ENCOUNTER — Ambulatory Visit: Payer: No Typology Code available for payment source | Attending: Neurology

## 2020-03-08 DIAGNOSIS — Z6841 Body Mass Index (BMI) 40.0 and over, adult: Secondary | ICD-10-CM | POA: Diagnosis not present

## 2020-03-08 DIAGNOSIS — G4700 Insomnia, unspecified: Secondary | ICD-10-CM | POA: Insufficient documentation

## 2020-03-08 DIAGNOSIS — G4733 Obstructive sleep apnea (adult) (pediatric): Secondary | ICD-10-CM | POA: Insufficient documentation

## 2020-03-08 DIAGNOSIS — I10 Essential (primary) hypertension: Secondary | ICD-10-CM | POA: Insufficient documentation

## 2020-03-09 ENCOUNTER — Other Ambulatory Visit: Payer: Self-pay

## 2020-03-12 ENCOUNTER — Other Ambulatory Visit: Payer: Self-pay | Admitting: Internal Medicine

## 2020-03-20 ENCOUNTER — Telehealth: Payer: Self-pay | Admitting: Internal Medicine

## 2020-03-20 ENCOUNTER — Other Ambulatory Visit: Payer: Self-pay | Admitting: Internal Medicine

## 2020-03-20 ENCOUNTER — Telehealth: Payer: Self-pay

## 2020-03-20 DIAGNOSIS — M62838 Other muscle spasm: Secondary | ICD-10-CM

## 2020-03-20 MED ORDER — CYCLOBENZAPRINE HCL 10 MG PO TABS: 15.0000 mg | ORAL_TABLET | Freq: Two times a day (BID) | ORAL | 0 refills | Status: DC

## 2020-03-20 NOTE — Telephone Encounter (Signed)
Please Advise.  KP

## 2020-03-20 NOTE — Telephone Encounter (Signed)
Called pt told her that a new prescription was sent in. Pt verbalized understanding.  KP

## 2020-03-20 NOTE — Telephone Encounter (Signed)
Called pt with results. Pt has moderate apnea and significant drops in oxygen while sleeping.  An order for the CPAP titration and the lab should be calling soon to schedule. Pt verbalized understanding.  KP

## 2020-03-20 NOTE — Telephone Encounter (Signed)
New Rx sent to [facility]

## 2020-03-20 NOTE — Telephone Encounter (Signed)
Called pt she said that she can see on Mychart where the results have been sent out twice but the results are blank on both results.   KP

## 2020-03-20 NOTE — Telephone Encounter (Signed)
Pt called stating that the pharmacy states that the insurance is no longer willing to cover the pts cyclobenzaprine at 5mg  tablets. She states that the insurance is not willing to prescribe that many pills in one day and is requesting to have an updated prescription. Pt is completely out of medication. Please advise.     [place] [place] [address]  Phone: [phone] Fax: [phone]  Hours: Not open 24 hours

## 2020-03-20 NOTE — Telephone Encounter (Signed)
Copied from [place] [id number]. Topic: General - Other >> Mar 20, 2020  9:29 AM Celene Kras wrote: Reason for CRM: Pt called stating that the first half of her sleep study is finished, she is requesting to have a call back to go over it. Please advise.

## 2020-03-20 NOTE — Telephone Encounter (Signed)
She has moderate apnea and significant drops in oxygen while sleeping.  I just sent in an order for the CPAP titration and the lab should be calling soon to schedule.

## 2020-04-03 ENCOUNTER — Encounter: Payer: No Typology Code available for payment source | Admitting: Dermatology

## 2020-04-04 ENCOUNTER — Telehealth: Payer: Self-pay

## 2020-04-04 NOTE — Telephone Encounter (Signed)
Copied from [place] [id number]. Topic: Quick Communication - See Telephone Encounter >> Apr 04, 2020 11:55 AM Loma [place] wrote: CRM for notification. See Telephone encounter for: 04/04/20. Pls FU with [facility] (scheduling) Lovena Le. Has been waiting for Authoization for a while and was just told was approved and being sent to both provider and to [facility], they have not received, wanting to see if office received before calling back. CB Taylor at [phone] ext [phone]

## 2020-04-04 NOTE — Telephone Encounter (Signed)
Sent over authorization to [place] and informed her.  CM

## 2020-04-05 ENCOUNTER — Ambulatory Visit: Payer: No Typology Code available for payment source | Attending: Neurology

## 2020-04-05 DIAGNOSIS — G4733 Obstructive sleep apnea (adult) (pediatric): Secondary | ICD-10-CM | POA: Insufficient documentation

## 2020-04-06 ENCOUNTER — Other Ambulatory Visit: Payer: Self-pay

## 2020-04-17 ENCOUNTER — Encounter: Payer: Self-pay | Admitting: Internal Medicine

## 2020-04-17 NOTE — Telephone Encounter (Signed)
Please Advise.  KP

## 2020-04-18 ENCOUNTER — Encounter: Payer: Self-pay | Admitting: Dermatology

## 2020-04-18 ENCOUNTER — Ambulatory Visit: Payer: No Typology Code available for payment source | Admitting: Dermatology

## 2020-04-18 ENCOUNTER — Other Ambulatory Visit: Payer: Self-pay

## 2020-04-18 DIAGNOSIS — L578 Other skin changes due to chronic exposure to nonionizing radiation: Secondary | ICD-10-CM

## 2020-04-18 DIAGNOSIS — D229 Melanocytic nevi, unspecified: Secondary | ICD-10-CM | POA: Diagnosis not present

## 2020-04-18 DIAGNOSIS — D2271 Melanocytic nevi of right lower limb, including hip: Secondary | ICD-10-CM

## 2020-04-18 DIAGNOSIS — L918 Other hypertrophic disorders of the skin: Secondary | ICD-10-CM

## 2020-04-18 DIAGNOSIS — L820 Inflamed seborrheic keratosis: Secondary | ICD-10-CM | POA: Diagnosis not present

## 2020-04-18 DIAGNOSIS — L821 Other seborrheic keratosis: Secondary | ICD-10-CM

## 2020-04-18 DIAGNOSIS — L304 Erythema intertrigo: Secondary | ICD-10-CM

## 2020-04-18 DIAGNOSIS — L409 Psoriasis, unspecified: Secondary | ICD-10-CM | POA: Diagnosis not present

## 2020-04-18 DIAGNOSIS — D1800 Hemangioma unspecified site: Secondary | ICD-10-CM

## 2020-04-18 DIAGNOSIS — Z85828 Personal history of other malignant neoplasm of skin: Secondary | ICD-10-CM

## 2020-04-18 DIAGNOSIS — Z1283 Encounter for screening for malignant neoplasm of skin: Secondary | ICD-10-CM

## 2020-04-18 DIAGNOSIS — L814 Other melanin hyperpigmentation: Secondary | ICD-10-CM

## 2020-04-18 DIAGNOSIS — L738 Other specified follicular disorders: Secondary | ICD-10-CM

## 2020-04-18 MED ORDER — OTEZLA 30 MG PO TABS: 30.0000 mg | ORAL_TABLET | Freq: Two times a day (BID) | ORAL | 5 refills | Status: DC

## 2020-04-18 MED ORDER — OTEZLA 30 MG PO TABS: 30.0000 mg | ORAL_TABLET | Freq: Two times a day (BID) | ORAL | 12 refills | Status: DC

## 2020-04-18 MED ORDER — ECONAZOLE NITRATE 1 % EX CREA: TOPICAL_CREAM | CUTANEOUS | 2 refills | Status: DC

## 2020-04-18 NOTE — Progress Notes (Signed)
Follow-Up Visit   Subjective  [name] is a 59 y.o. female who presents for the following: Annual Exam (Hx BCC - right paranasal) and Growths (face, neck, inframammary. Some are irritated.). Patient had genetic testing for melanoma, Positive BRCA2. Patient's mother with hx of melanoma, died from breast cancer. Hx LSC of the vaginal area, biopsy proven by Dr Gilman Schmidt, in the past. She has used Clobetasol Ointment in the past. She has a history of psoriasis of the scalp, ears, hands. She also has arthritis in hands, knees. She has used TMC, Calcipotriene cream, Clobetasol, and Prednisone in past for psoriasis.  She has rash under breast and groin area.  She has itchy growths on her face and side.  The following portions of the chart were reviewed this encounter and updated as appropriate:      Review of Systems:  No other skin or systemic complaints except as noted in HPI or Assessment and Plan.  Objective  Well appearing patient in no apparent distress; mood and affect are within normal limits.  A full examination was performed including scalp, head, eyes, ears, nose, lips, neck, chest, axillae, abdomen, back, buttocks, bilateral upper extremities, bilateral lower extremities, hands, feet, fingers, toes, fingernails, and toenails. All findings within normal limits unless otherwise noted below.  Objective  Right Foot, 1st Webspace: 4.0 x 3.73m brown papule  Objective  Inframammary, Inguinal: Mild erythema with small pink papules.  Objective  Bil Ear Canals, scalp, hands: Erythema with scale on bil ear canals; pink scaly plaque on the L 3rd finger; small patches on bil hand dorsum; pink scaly patches on frontal and temple hairlines.  Objective  Left Flank x 2, L lat cheek x 2 (4): Erythematous keratotic or waxy stuck-on papule    Assessment & Plan   Skin cancer screening performed today.  Actinic Damage - chronic, secondary to cumulative UV radiation exposure/sun  exposure over time - diffuse scaly erythematous macules with underlying dyspigmentation - Recommend daily broad spectrum sunscreen SPF 30+ to sun-exposed areas, reapply every 2 hours as needed.  - Call for new or changing lesions.  History of Basal Cell Carcinoma of the Skin - No evidence of recurrence today of the right paranasal. - Recommend regular full body skin exams - Recommend daily broad spectrum sunscreen SPF 30+ to sun-exposed areas, reapply every 2 hours as needed.  - Call if any new or changing lesions are noted between office visits  Sebaceous Hyperplasia - Small yellow papules with a central dell - Benign - Observe -Discussed cosmetic procedure, noncovered.  $60 for 1st lesion and $15 for each additional lesion if done on the same day.  Maximum charge $350.  One touch-up treatment included no charge. Discussed risks of treatment including dyspigmentation, small scar, and/or recurrence.   Seborrheic Keratoses - Stuck-on, waxy, tan-brown papules and plaques  - Discussed benign etiology and prognosis. - Observe - Call for any changes  Lentigines - Scattered tan macules - Discussed due to sun exposure - Benign, observe - Call for any changes  Melanocytic Nevi - Tan-brown and/or pink-flesh-colored symmetric macules and papules - Benign appearing on exam today - Observation - Call clinic for new or changing moles - Recommend daily use of broad spectrum spf 30+ sunscreen to sun-exposed areas.   Hemangiomas - Red papules - Discussed benign nature - Observe - Call for any changes  Acrochordons (Skin Tags) - Fleshy, skin-colored pedunculated papules - Benign appearing.  - Observe. - If desired, they can be removed  with an in office procedure that is not covered by insurance. - Please call the clinic if you notice any new or changing lesions.   Nevus Right Foot, 1st Webspace  Benign-appearing.  Stable from previous visit. Observation.  Call clinic for new or  changing moles.  Recommend daily use of broad spectrum spf 30+ sunscreen to sun-exposed areas.    Erythema intertrigo Inframammary, Inguinal  Chronic, with flare  Start Econazole Cream Apply to AAs qd/bid prn.  Start Zeasorb AF powder daily as preventative.  econazole nitrate 1 % cream - Inframammary, Inguinal  Psoriasis Bil Ear Canals, scalp, hands  With arthritis, probable PsA  Start Halog Solution Apply qd/bid ears/hairline prn itch. Sample given. Risk atrophy. Start Clobetasol ointment qd/bid to aas hands, pt has.  Discussed Otezla and side effects. Sample starter pack given today as well as information pamphet Will plan to start pending insurance approval. Sent to [place].  Side effects of Otezla (apremilast) include diarrhea, nausea, headache, upper respiratory infection, depression, and weight decrease (5-10%). It should only be taken by pregnant women after a discussion regarding risks and benefits with their doctor. Goal is control of skin condition, not cure.  The use of [place] requires long term medication management, including periodic office visits.   Apremilast (OTEZLA) 30 MG TABS - Bil Ear Canals, scalp, hands  Apremilast (OTEZLA) 30 MG TABS - Bil Ear Canals, scalp, hands  Inflamed seborrheic keratosis (4) Left Flank x 2, L lat cheek x 2  Destruction of lesion - Left Flank x 2, L lat cheek x 2  Destruction method: cryotherapy   Informed consent: discussed and consent obtained   Lesion destroyed using liquid nitrogen: Yes   Region frozen until ice ball extended beyond lesion: Yes   Outcome: patient tolerated procedure well with no complications   Post-procedure details: wound care instructions given    Return in about 4 weeks (around 05/16/2020) for psoriasis. Treat SH with ED, cosmetic.Lindi Adie, CMA, am acting as scribe for Brendolyn Patty, MD .  Documentation: I have reviewed the above documentation for accuracy and completeness, and I agree  with the above.  Brendolyn Patty MD

## 2020-04-18 NOTE — Patient Instructions (Addendum)
Halog Solution - Apply to psoriasis in ears/hairline 1-2 times a day. Avoid face. Do not use daily for a long period of time.  Cryotherapy Aftercare  . Wash gently with soap and water everyday.   Marland Kitchen Apply Vaseline and Band-Aid daily until healed.   Start [place] - take as directed.  Side effects of Otezla (apremilast) include diarrhea, nausea, headache, upper respiratory infection, depression, and weight decrease (5-10%). It should only be taken by pregnant women after a discussion regarding risks and benefits with their doctor. Goal is control of skin condition, not cure.  The use of [place] requires long term medication management, including periodic office visits.  Econazole Cream - Apply under breasts and in groin creases 1-2 times a day. Twice daily for flares and once daily for maintenance.  Zeasorb AF powder - Apply daily after shower.

## 2020-04-26 ENCOUNTER — Encounter: Payer: Self-pay | Admitting: Pharmacist

## 2020-04-26 ENCOUNTER — Other Ambulatory Visit: Payer: Self-pay

## 2020-04-26 ENCOUNTER — Ambulatory Visit (HOSPITAL_BASED_OUTPATIENT_CLINIC_OR_DEPARTMENT_OTHER): Payer: No Typology Code available for payment source | Admitting: Pharmacist

## 2020-04-26 ENCOUNTER — Other Ambulatory Visit: Payer: Self-pay | Admitting: Internal Medicine

## 2020-04-26 DIAGNOSIS — L409 Psoriasis, unspecified: Secondary | ICD-10-CM

## 2020-04-26 DIAGNOSIS — Z79899 Other long term (current) drug therapy: Secondary | ICD-10-CM

## 2020-04-26 MED ORDER — OTEZLA 30 MG PO TABS: 30.0000 mg | ORAL_TABLET | Freq: Two times a day (BID) | ORAL | 6 refills | Status: DC

## 2020-04-26 NOTE — Progress Notes (Signed)
  S: Patient presents for review of their specialty medication therapy.  Patient is currently taking Otezla for psoriasis. Patient is managed by Dr. Rochele Pages for this. Given starter pack and is completing this. She is beginning 30 mg BID today.   Adherence: reported  Efficacy: still completing her starter pack.   Dosing:  Active psoriatic arthritis or plaque psoriasis (moderate to severe): Oral: Initial: 10 mg in the morning. Titrate upward by additional 10 mg per day on days 2 to 5 as follows: Day 2: 10 mg twice daily; Day 3: 10 mg in the morning and 20 mg in the evening; Day 4: 20 mg twice daily; Day 5: 20 mg in the morning and 30 mg in the evening. Maintenance dose: 30 mg twice daily starting on day 6  Current adverse effects: Headache: none  GI upset: reports nausea that was managed with slow dose titration. Taking the medication with food helps with this as well.  Weight loss: none  Neuropsychiatric effects: none  O: Lab Results  Component Value Date   WBC 9.6 11/21/2019   HGB 13.4 11/21/2019   HCT 39.7 11/21/2019   MCV 93 11/21/2019   PLT 345 11/21/2019      Chemistry      Component Value Date/Time   NA 141 11/21/2019 1137   NA 138 08/21/2013 1550   K 4.0 11/21/2019 1137   K 3.7 08/21/2013 1550   CL 102 11/21/2019 1137   CL 104 08/21/2013 1550   CO2 25 11/21/2019 1137   CO2 30 08/21/2013 1550   BUN 11 11/21/2019 1137   BUN 7 08/21/2013 1550   CREATININE 0.53 (L) 11/21/2019 1137   CREATININE 0.66 08/21/2013 1550      Component Value Date/Time   CALCIUM 8.9 11/21/2019 1137   CALCIUM 8.7 08/21/2013 1550   ALKPHOS 82 11/21/2019 1137   ALKPHOS 135 03/07/2013 1143   AST 20 11/21/2019 1137   AST 21 03/07/2013 1143   ALT 17 11/21/2019 1137   ALT 26 03/07/2013 1143   BILITOT 0.4 11/21/2019 1137   BILITOT 0.5 03/07/2013 1143       A/P: 1. Medication review: patient is currently on [place] for psoriasis and is tolerating it okay. She did experience some nausea but  is managing this with slow dose titration. Reviewed the medication including the following: apremilast inhibits phosphodiesterase 4 (PDE4) specific for cyclic adenosine monophosphate (cAMP) which results in increased intracellular cAMP levels and regulation of numerous inflammatory mediators (eg, decreased expression of nitric oxide synthase, TNF-alpha, and interleukin [IL]-23, as well as increased IL-10. Patient educated on purpose, proper use and potential adverse effects of Otezla. Possible adverse effects include weight loss, GI upset, headache, and mood changes. Renal function should be routinely monitored. Administer without regard to food. Do not crush, chew, or split tablets. No recommendations for any changes at this time.  Benard Halsted, PharmD, [place] [phone]

## 2020-05-02 ENCOUNTER — Encounter: Payer: Self-pay | Admitting: Internal Medicine

## 2020-05-02 MED FILL — OTEZLA 30 MG TABS: 30 | 30 days supply | Qty: 60 | Fill #0

## 2020-05-09 ENCOUNTER — Ambulatory Visit (INDEPENDENT_AMBULATORY_CARE_PROVIDER_SITE_OTHER): Payer: No Typology Code available for payment source | Admitting: Internal Medicine

## 2020-05-09 ENCOUNTER — Other Ambulatory Visit: Payer: Self-pay | Admitting: Internal Medicine

## 2020-05-09 ENCOUNTER — Encounter: Payer: Self-pay | Admitting: Internal Medicine

## 2020-05-09 ENCOUNTER — Other Ambulatory Visit: Payer: Self-pay

## 2020-05-09 VITALS — BP 142/98 | HR 80 | Temp 98.1°F | Ht 61.0 in | Wt 227.0 lb

## 2020-05-09 DIAGNOSIS — Z23 Encounter for immunization: Secondary | ICD-10-CM | POA: Diagnosis not present

## 2020-05-09 DIAGNOSIS — R7303 Prediabetes: Secondary | ICD-10-CM | POA: Diagnosis not present

## 2020-05-09 DIAGNOSIS — Z1509 Genetic susceptibility to other malignant neoplasm: Secondary | ICD-10-CM

## 2020-05-09 DIAGNOSIS — I10 Essential (primary) hypertension: Secondary | ICD-10-CM

## 2020-05-09 DIAGNOSIS — Z1231 Encounter for screening mammogram for malignant neoplasm of breast: Secondary | ICD-10-CM

## 2020-05-09 DIAGNOSIS — G4733 Obstructive sleep apnea (adult) (pediatric): Secondary | ICD-10-CM

## 2020-05-09 DIAGNOSIS — Z1501 Genetic susceptibility to malignant neoplasm of breast: Secondary | ICD-10-CM

## 2020-05-09 DIAGNOSIS — L409 Psoriasis, unspecified: Secondary | ICD-10-CM

## 2020-05-09 DIAGNOSIS — M797 Fibromyalgia: Secondary | ICD-10-CM

## 2020-05-09 MED ORDER — TRAMADOL HCL 50 MG PO TABS: 50.0000 mg | ORAL_TABLET | Freq: Two times a day (BID) | ORAL | 0 refills | Status: DC | PRN

## 2020-05-09 MED ORDER — NEBIVOLOL HCL 20 MG PO TABS: 20.0000 mg | ORAL_TABLET | Freq: Every day | ORAL | 1 refills | Status: DC

## 2020-05-09 MED ORDER — DOXAZOSIN MESYLATE 2 MG PO TABS: 2.0000 mg | ORAL_TABLET | Freq: Every day | ORAL | 1 refills | Status: DC

## 2020-05-09 MED ORDER — ALBUTEROL SULFATE HFA 108 (90 BASE) MCG/ACT IN AERS: 2.0000 | INHALATION_SPRAY | Freq: Four times a day (QID) | RESPIRATORY_TRACT | 5 refills | Status: DC | PRN

## 2020-05-09 NOTE — Patient Instructions (Signed)
Increase the bystolic to 20 mg per day

## 2020-05-09 NOTE — Progress Notes (Addendum)
Date:  05/09/2020   Name:  [name]   DOB:  Nov 22, 1960   MRN:  [id number]   Chief Complaint: Prediabetes (f/u) and Hypertension  Diabetes She presents for her follow-up diabetic visit. Diabetes type: prediabetes. Her disease course has been stable. Pertinent negatives for hypoglycemia include no headaches, nervousness/anxiousness or tremors. Associated symptoms include fatigue. Pertinent negatives for diabetes include no chest pain, no polydipsia and no polyuria. Current diabetic treatment includes diet.  Hypertension This is a chronic problem. The problem is controlled. Pertinent negatives include no chest pain, headaches, palpitations or shortness of breath. Past treatments include ACE inhibitors, alpha 1 blockers and beta blockers. The current treatment provides significant improvement. There are no compliance problems.   OSA - she is still waiting to hear from [facility] about her equipment.  The orders were faxed about three weeks ago.  She will continue to call them every few days. Fibromyalgia - somewhat worse recently - likely due to poor sleep.  She takes flexeril 30 mg per day - insurance would not pay for 5 mg tabs so has to try to cut them in half.  I suggest trying 10 mg tid rather than 15 mg bid.   She has only used 40 tramadol in the past 6 months. BRCA2 mutation - she was advised by GYN and Oncology to have a breast MRI.  However, neither of them ordered it.  She is overdue for her annual mammogram.  She did have a complete hysterectomy and considered a bilateral mastectomy.   She denies breast issues.   Immunization History  Administered Date(s) Administered  . Influenza,inj,Quad PF,6+ Mos 03/19/2015, 03/02/2017, 07/19/2018, 05/13/2019  . PFIZER SARS-COV-2 Vaccination 09/02/2019, 09/28/2019  . Pneumococcal Conjugate-13 01/17/2016  . Pneumococcal Polysaccharide-23 06/10/2010  . Zoster Recombinat (Shingrix) 11/21/2019, 02/15/2020    Lab Results  Component Value Date    CREATININE 0.53 (L) 11/21/2019   BUN 11 11/21/2019   NA 141 11/21/2019   K 4.0 11/21/2019   CL 102 11/21/2019   CO2 25 11/21/2019   Lab Results  Component Value Date   CHOL 185 11/21/2019   HDL 49 11/21/2019   LDLCALC 100 (H) 11/21/2019   TRIG 212 (H) 11/21/2019   CHOLHDL 3.8 11/21/2019   Lab Results  Component Value Date   TSH 2.870 11/21/2019   Lab Results  Component Value Date   HGBA1C 6.1 (H) 11/21/2019   Lab Results  Component Value Date   WBC 9.6 11/21/2019   HGB 13.4 11/21/2019   HCT 39.7 11/21/2019   MCV 93 11/21/2019   PLT 345 11/21/2019   Lab Results  Component Value Date   ALT 17 11/21/2019   AST 20 11/21/2019   ALKPHOS 82 11/21/2019   BILITOT 0.4 11/21/2019     Review of Systems  Constitutional: Positive for fatigue. Negative for appetite change and fever.  HENT: Negative for tinnitus and trouble swallowing.   Eyes: Negative for visual disturbance.  Respiratory: Positive for apnea and wheezing. Negative for cough, chest tightness and shortness of breath.   Cardiovascular: Negative for chest pain, palpitations and leg swelling.  Gastrointestinal: Negative for abdominal pain, constipation and diarrhea.  Endocrine: Negative for polydipsia and polyuria.  Genitourinary: Negative for dysuria and hematuria.  Musculoskeletal: Positive for arthralgias and myalgias. Negative for gait problem and joint swelling.  Skin: Positive for rash.  Neurological: Negative for tremors, numbness and headaches.  Psychiatric/Behavioral: Positive for sleep disturbance. Negative for dysphoric mood. The patient is not nervous/anxious.  Patient Active Problem List   Diagnosis Date Noted  . S/P hysterectomy with oophorectomy 05/19/2019  . BRCA2 gene mutation positive 03/03/2019  . Pre-diabetes 09/16/2016  . Insomnia 09/16/2016  . Arthritis of carpometacarpal (CMC) joint of thumb 05/29/2016  . Venous insufficiency of both lower extremities 01/17/2016  . Acquired  hypothyroidism 11/10/2014  . Essential (primary) hypertension 11/10/2014  . Fibromyalgia syndrome 11/10/2014  . Mixed hyperlipidemia 11/10/2014  . Irritable bowel syndrome with diarrhea 11/10/2014  . Asthma, mild intermittent 11/10/2014  . BMI 40.0-44.9, adult ([place]) 11/10/2014  . Chronic peptic ulcer 11/10/2014  . Psoriasis 11/10/2014  . Obstructive apnea 11/10/2014  . Chest pain 07/19/2012  . Environmental and seasonal allergies 09/08/2011    Allergies  Allergen Reactions  . Sulfa Antibiotics Other (See Comments)    seizure  . Amlodipine Swelling    Leg swelling  . Furosemide Swelling  . Lyrica [Pregabalin] Other (See Comments)    Dizziness and blurred vision  . Hydrochlorothiazide Itching and Rash    Past Surgical History:  Procedure Laterality Date  . APPENDECTOMY    . BREAST BIOPSY Left 2007   benign, dr Bary Castilla  . CHOLECYSTECTOMY    . CYSTOSCOPY  05/19/2019   Procedure: CYSTOSCOPY;  Surgeon: Homero Fellers, MD;  Location: [facility] ORS;  Service: Gynecology;;  . HYSTEROSCOPY WITH D & C N/A 12/14/2018   Procedure: DILATATION AND CURETTAGE /HYSTEROSCOPY WITH MYOSURE, VULVAR BIOPSY;  Surgeon: Homero Fellers, MD;  Location: [facility] ORS;  Service: Gynecology;  Laterality: N/A;  . SKIN BIOPSY Left    Back on L leg   . SKIN CANCER EXCISION     BCCA of face  . TONSILLECTOMY AND ADENOIDECTOMY    . TOTAL LAPAROSCOPIC HYSTERECTOMY WITH SALPINGECTOMY N/A 05/19/2019   Procedure: TOTAL LAPAROSCOPIC HYSTERECTOMY WITH SALPINGECTOMY/ TLH/BSO;  Surgeon: Homero Fellers, MD;  Location: [facility] ORS;  Service: Gynecology;  Laterality: N/A;    Social History   Tobacco Use  . Smoking status: Never Smoker  . Smokeless tobacco: Never Used  Vaping Use  . Vaping Use: Never used  Substance Use Topics  . Alcohol use: Yes    Alcohol/week: 2.0 standard drinks    Types: 2 Standard drinks or equivalent per week    Comment: rarely  . Drug use: No     Medication list has been  reviewed and updated.  Current Meds  Medication Sig  . albuterol (PROVENTIL HFA;VENTOLIN HFA) 108 (90 BASE) MCG/ACT inhaler Inhale 2 puffs into the lungs 4 (four) times daily as needed.  Marland Kitchen Apremilast (OTEZLA) 30 MG TABS Take 1 tablet (30 mg total) by mouth 2 (two) times daily.  Marland Kitchen aspirin 81 MG EC tablet Take 81 mg by mouth at bedtime.   . blood glucose meter kit and supplies KIT Dispense based on patient and insurance preference. Use up to four times daily as directed. (FOR ICD-9 250.00, 250.01).  . BYSTOLIC 10 MG tablet TAKE 1 TABLET BY MOUTH DAILY  . cetirizine (ZYRTEC) 10 MG tablet Take 10 mg by mouth every morning.   . cholestyramine light (PREVALITE) 4 g packet MIX 1 PACKET AS DIRECTED AND DRINK ONCE DAILY  . cyclobenzaprine (FLEXERIL) 10 MG tablet Take 1.5 tablets (15 mg total) by mouth in the morning and at bedtime.  . diphenhydrAMINE (BENADRYL) 25 MG tablet Take 25-50 mg by mouth every 6 (six) hours as needed (allergies/asthma).  . doxazosin (CARDURA) 2 MG tablet Take 1 tablet (2 mg total) by mouth daily.  Marland Kitchen doxepin (SINEQUAN)  100 MG capsule TAKE 1 CAPSULE BY MOUTH ONCE DAILY AT BEDTIME  . econazole nitrate 1 % cream Apply under breasts and in groin creases 1-2 times a day as needed.  Marland Kitchen esomeprazole (NEXIUM) 40 MG capsule Take 1 capsule (40 mg total) by mouth daily before supper. (1700)  . fluticasone (FLONASE) 50 MCG/ACT nasal spray Place 2 sprays into both nostrils daily.  Marland Kitchen FREESTYLE LITE test strip   . Lancets (FREESTYLE) lancets 1 each 4 (four) times daily.  Marland Kitchen levothyroxine (SYNTHROID) 200 MCG tablet TAKE 1 TABLET (200 MCG TOTAL) BY MOUTH DAILY.  Marland Kitchen lisinopril (ZESTRIL) 40 MG tablet TAKE 1 TABLET BY MOUTH DAILY  . loperamide (IMODIUM A-D) 2 MG tablet Take 2-4 mg by mouth 4 (four) times daily as needed for diarrhea or loose stools.   . meloxicam (MOBIC) 15 MG tablet TAKE 1 TABLET BY MOUTH DAILY.  . montelukast (SINGULAIR) 10 MG tablet TAKE 1 TABLET BY MOUTH NIGHTLY AT BEDTIME  .  OVER THE COUNTER MEDICATION Woman's multvitamin-Take 1 gummie daily.  . tamoxifen (NOLVADEX) 20 MG tablet Take 1 tablet (20 mg total) by mouth daily.  . traMADol (ULTRAM) 50 MG tablet TAKE 1 TABLET BY MOUTH EVERY 12 HOURS AS NEEDED  . vitamin B-12 (CYANOCOBALAMIN) 1000 MCG tablet Take 1500 mcg-2 gummie daily.    PHQ 2/9 Scores 05/09/2020 11/21/2019 05/13/2019 11/18/2018  PHQ - 2 Score 0 2 0 0  PHQ- 9 Score 3 8 - 2    GAD 7 : Generalized Anxiety Score 05/09/2020 11/21/2019  Nervous, Anxious, on Edge 0 0  Control/stop worrying 0 0  Worry too much - different things 0 0  Trouble relaxing 0 0  Restless 0 0  Easily annoyed or irritable 0 0  Afraid - awful might happen 0 0  Total GAD 7 Score 0 0  Anxiety Difficulty - Not difficult at all    BP Readings from Last 3 Encounters:  05/09/20 (!) 142/98  11/21/19 138/90  08/10/19 (!) 150/80    Physical Exam Vitals and nursing note reviewed.  Constitutional:      General: She is not in acute distress.    Appearance: She is well-developed. She is obese.  HENT:     Head: Normocephalic and atraumatic.  Cardiovascular:     Rate and Rhythm: Normal rate and regular rhythm.     Pulses: Normal pulses.     Heart sounds: No murmur heard.   Pulmonary:     Effort: Pulmonary effort is normal. No respiratory distress.     Breath sounds: No wheezing or rhonchi.  Lymphadenopathy:     Cervical: No cervical adenopathy.  Skin:    General: Skin is warm and dry.     Capillary Refill: Capillary refill takes less than 2 seconds.  Neurological:     General: No focal deficit present.     Mental Status: She is alert and oriented to person, place, and time.  Psychiatric:        Mood and Affect: Mood normal.        Behavior: Behavior normal.     Wt Readings from Last 3 Encounters:  05/09/20 227 lb (103 kg)  11/21/19 226 lb (102.5 kg)  08/10/19 222 lb (100.7 kg)    BP (!) 142/98   Pulse 80   Temp 98.1 F (36.7 C) (Oral)   Ht $R'5\' 1"'RH$  (1.549 m)   Wt  227 lb (103 kg)   SpO2 96%   BMI 42.89 kg/m   Assessment and  Plan: 1. Essential (primary) hypertension BP not well controlled - will increase dose of Bystolic Follow up in 4 months - doxazosin (CARDURA) 2 MG tablet; Take 1 tablet (2 mg total) by mouth daily.  Dispense: 90 tablet; Refill: 1 - nebivolol 20 MG TABS; Take 1 tablet (20 mg total) by mouth daily.  Dispense: 90 tablet; Refill: 1  2. Pre-diabetes Weight stable; continue low carb diet choices - Hemoglobin A1c  3. Encounter for screening mammogram for breast cancer Recommended to have breast MRI rather than screening mammogram - MM 3D SCREEN BREAST BILATERAL; Future - cancel - MR BREAST BILATERAL W WO CONTRAST INC CAD; Future  4. Obstructive apnea Still has not heard from the CPAP supplier She will continue to call them since the order has been faxed several weeks ago - they may be delayed due to the holidays. - albuterol (VENTOLIN HFA) 108 (90 Base) MCG/ACT inhaler; Inhale 2 puffs into the lungs 4 (four) times daily as needed.  Dispense: 18 g; Refill: 5  5. Fibromyalgia syndrome Change flexeril to 10 mg tid Continue Tramadol PRN severe pain - traMADol (ULTRAM) 50 MG tablet; Take 1 tablet (50 mg total) by mouth every 12 (twelve) hours as needed.  Dispense: 60 tablet; Refill: 0  6. Need for immunization against influenza - Flu Vaccine QUAD 36+ mos IM  7. BRCA2 gene mutation positive - MR BREAST BILATERAL W WO CONTRAST INC CAD; Future   Partially dictated using Editor, commissioning. Any errors are unintentional.  Halina Maidens, MD [facility]  05/09/2020

## 2020-05-10 LAB — HEMOGLOBIN A1C
Est. average glucose Bld gHb Est-mCnc: 131 mg/dL
Hgb A1c MFr Bld: 6.2 % — ABNORMAL HIGH (ref 4.8–5.6)

## 2020-05-16 ENCOUNTER — Other Ambulatory Visit: Payer: Self-pay

## 2020-05-16 ENCOUNTER — Ambulatory Visit: Payer: No Typology Code available for payment source | Admitting: Dermatology

## 2020-05-16 DIAGNOSIS — L409 Psoriasis, unspecified: Secondary | ICD-10-CM

## 2020-05-16 DIAGNOSIS — L820 Inflamed seborrheic keratosis: Secondary | ICD-10-CM | POA: Diagnosis not present

## 2020-05-16 DIAGNOSIS — M1990 Unspecified osteoarthritis, unspecified site: Secondary | ICD-10-CM | POA: Diagnosis not present

## 2020-05-16 DIAGNOSIS — D1801 Hemangioma of skin and subcutaneous tissue: Secondary | ICD-10-CM

## 2020-05-16 DIAGNOSIS — L738 Other specified follicular disorders: Secondary | ICD-10-CM | POA: Diagnosis not present

## 2020-05-16 MED ORDER — FLUOCINOLONE ACETONIDE 0.01 % OT OIL: TOPICAL_OIL | OTIC | 2 refills | Status: AC

## 2020-05-16 NOTE — Progress Notes (Addendum)
Follow-Up Visit   Subjective  [name] is a 59 y.o. female who presents for the following: Psoriasis (Some improvement of the hairline, ears with Halog solution. She hasn't used clobetasol ointment on hands, but has at home. She is taking Otezla 30mg  BID, she had GI upset when she first started. Not much improvement with arthritis yet.), Sebacous Hyperplasia (face, cosmetic treatment today with ED), and growth (post upper arm, peeling, irritated).  Also neck.   The following portions of the chart were reviewed this encounter and updated as appropriate:      Review of Systems:  No other skin or systemic complaints except as noted in HPI or Assessment and Plan.  Objective  Well appearing patient in no apparent distress; mood and affect are within normal limits.  A focused examination was performed including face, scalp, ears. Relevant physical exam findings are noted in the Assessment and Plan.  Objective  Ears, scalp, hands: Mild erythema and scale of the ear canals; mild scaling on frontal scalp.  Objective  forehead, chin, cheeks, nose: Yellow papules. Few scattered red papules  Images          Objective  Left Hand - Anterior, R neck x 2, R post upper arm x 1 (3): Erythematous keratotic or waxy stuck-on papule     Assessment & Plan  Psoriasis Ears, scalp, hands  With arthritis, prob PsA, some improvement noted, decreased itching, not at goal  Continue Otezla 30mg  1 po BID as tolerated. Start Clobetasol ointment qd/bid hands prn. Avoid face, groin, axilla. Continue Halog Solution qd/bid ears PRN flares until finished with sample.  Start Fluocinolone Oil Apply 1-2 drops qd/bid prn dsp 66mL 2Rf.  Side effects of Otezla (apremilast) include diarrhea, nausea, headache, upper respiratory infection, depression, and weight decrease (5-10%). It should only be taken by pregnant women after a discussion regarding risks and benefits with their doctor. Goal is  control of skin condition, not cure.  The use of [place] Nail requires long term medication management, including periodic office visits.  Topical steroids (such as triamcinolone, fluocinolone, fluocinonide, mometasone, clobetasol, halobetasol, betamethasone, hydrocortisone) can cause thinning and lightening of the skin if they are used for too long in the same area. Your physician has selected the right strength medicine for your problem and area affected on the body. Please use your medication only as directed by your physician to prevent side effects.    Fluocinolone Acetonide 0.01 % OIL - Ears, scalp, hands  Other Related Medications Apremilast (OTEZLA) 30 MG TABS  Sebaceous hyperplasia forehead, chin, cheeks, nose  And hemangiomas Discussed ED cosmetic procedure, noncovered.  $60 for 1st lesion and $15 for each additional lesion if done on the same day.  Maximum charge $350.  One touch-up treatment included no charge. Discussed risks of treatment including dyspigmentation, small scar, and/or recurrence.  ED performed today x >21 (Leupp and Hemagiomas)  Destruction of lesion - forehead, chin, cheeks, nose  Destruction method: electrodesiccation and curettage   Destruction method comment:  Electrodessication only Outcome: patient tolerated procedure well with no complications   Post-procedure details: wound care instructions given    Inflamed seborrheic keratosis (4) Left Hand - Anterior; R neck x 2, R post upper arm x 1 (3)  Destruction of lesion - R neck x 2, R post upper arm x 1  Destruction method: cryotherapy   Informed consent: discussed and consent obtained   Lesion destroyed using liquid nitrogen: Yes   Region frozen until ice ball extended beyond  lesion: Yes   Outcome: patient tolerated procedure well with no complications   Post-procedure details: wound care instructions given    Return in about 3 months (around 08/14/2020) for psoriasis.   Documentation: I have reviewed the  above documentation for accuracy and completeness, and I agree with the above.  Brendolyn Patty MD

## 2020-05-16 NOTE — Patient Instructions (Addendum)
Clobetasol Ointment - Spot treat affected areas psoriasis on hands 1-2 times a day as needed. Avoid face, groin, underarms.  Topical steroids (such as triamcinolone, fluocinolone, fluocinonide, mometasone, clobetasol, halobetasol, betamethasone, hydrocortisone) can cause thinning and lightening of the skin if they are used for too long in the same area. Your physician has selected the right strength medicine for your problem and area affected on the body. Please use your medication only as directed by your physician to prevent side effects.   Continue Otezla 30mg  twice daily as tolerated. Side effects of Otezla (apremilast) include diarrhea, nausea, headache, upper respiratory infection, depression, and weight decrease (5-10%). It should only be taken by pregnant women after a discussion regarding risks and benefits with their doctor. Goal is control of skin condition, not cure.  The use of [place] Nail requires long term medication management, including periodic office visits.  Continue Halog Solution 1-2 times a day ears as needed (until finished with sample). Then start Fluocinolone Oil Apply 1-2 drops 1-2 times daily to ears as needed.

## 2020-05-30 MED FILL — OTEZLA 30 MG TABS: 30 | 30 days supply | Qty: 60 | Fill #1

## 2020-06-15 ENCOUNTER — Other Ambulatory Visit: Payer: Self-pay | Admitting: Internal Medicine

## 2020-06-15 DIAGNOSIS — M62838 Other muscle spasm: Secondary | ICD-10-CM

## 2020-06-28 MED FILL — OTEZLA 30 MG TABS: 30 | 30 days supply | Qty: 60 | Fill #2

## 2020-07-13 ENCOUNTER — Other Ambulatory Visit: Payer: Self-pay | Admitting: Internal Medicine

## 2020-07-13 DIAGNOSIS — M797 Fibromyalgia: Secondary | ICD-10-CM

## 2020-07-13 DIAGNOSIS — G4700 Insomnia, unspecified: Secondary | ICD-10-CM

## 2020-07-13 NOTE — Telephone Encounter (Signed)
Requested medication (s) are due for refill today: no  Requested medication (s) are on the active medication list: yes  Last refill:  09/29/2019  Future visit scheduled: yes  Notes to clinic:  this refill cannot be delegated    Requested Prescriptions  Pending Prescriptions Disp Refills   traMADol (ULTRAM) 50 MG tablet [Pharmacy Med Name: traMADol HCL 50 MG TABS 50 Tablet] 60 tablet 0    Sig: TAKE 1 TABLET BY MOUTH EVERY 12 HOURS AS NEEDED      Not Delegated - Analgesics:  Opioid Agonists Failed - 07/13/2020  8:44 AM      Failed - This refill cannot be delegated      Failed - Urine Drug Screen completed in last 360 days      Passed - Valid encounter within last 6 months    Recent Outpatient Visits           2 months ago Essential (primary) hypertension   [facility]an [facility], [facility]   2 months ago Encounter for medication review   [place], [name]   7 months ago Annual physical exam   [facility] Glean Hess, MD   1 year ago Essential (primary) hypertension   [facility] Glean Hess, [facility]   1 year ago Annual physical exam   [facility] Glean Hess, MD       Future Appointments             In 1 month Brendolyn Patty, MD [place]   In 1 month Glean Hess, MD [facility]               traZODone (DESYREL) 50 MG tablet [Pharmacy Med Name: traZODone HCL 50 MG TABS 50 Tablet] 180 tablet 1    Sig: TAKE 1 TO 2 TABLETS BY MOUTH AT BEDTIME AS NEEDED FOR SLEEP.      Psychiatry: Antidepressants - Serotonin Modulator Passed - 07/13/2020  8:44 AM      Passed - Valid encounter within last 6 months    Recent Outpatient Visits           2 months ago Essential (primary) hypertension   [facility] Glean Hess, MD   2 months ago Encounter for medication review   [place], [name]   7 months ago Annual physical exam   [facility] Glean Hess, MD   1 year ago Essential (primary) hypertension   [facility] Glean Hess, [facility]   1 year ago Annual physical exam   [facility] Glean Hess, MD       Future Appointments             In 1 month Brendolyn Patty, MD [place]   In 1 month Glean Hess, MD [facility]              Signed Prescriptions Disp Refills   montelukast (SINGULAIR) 10 MG tablet 90 tablet 1    Sig: TAKE 1 TABLET BY MOUTH NIGHTLY AT BEDTIME      Pulmonology:  Leukotriene Inhibitors Passed - 07/13/2020  8:44 AM      Passed - Valid encounter within last 12 months    Recent Outpatient Visits           2 months ago Essential (  primary) hypertension   [facility] Glean Hess, MD   2 months ago Encounter for medication review   [place], [name]   7 months ago Annual physical exam   [facility] Glean Hess, MD   1 year ago Essential (primary) hypertension   [facility] Glean Hess, MD   1 year ago Annual physical exam   [facility] Glean Hess, MD       Future Appointments             In 1 month Brendolyn Patty, MD [place]   In 1 month Glean Hess, MD [facility]               doxepin (SINEQUAN) 100 MG capsule 90 capsule 1    Sig: TAKE 1 CAPSULE BY MOUTH ONCE DAILY AT BEDTIME      Psychiatry:  Antidepressants - Heterocyclics (TCAs) Passed - 07/13/2020  8:44 AM      Passed - Valid encounter within last 6 months    Recent Outpatient Visits           2 months ago Essential (primary) hypertension   [facility] Glean Hess, MD   2 months ago Encounter for medication review   [place], [name]   7 months ago Annual  physical exam   [facility] Glean [facility], [facility]   1 year ago Essential (primary) hypertension   [facility] Glean Hess, MD   1 year ago Annual physical exam   [facility] Glean [facility], MD       Future Appointments             In 1 month Brendolyn Patty, MD [place]   In 1 month Army Melia, Jesse Sans, MD [facility]

## 2020-07-16 ENCOUNTER — Other Ambulatory Visit: Payer: Self-pay | Admitting: Internal Medicine

## 2020-07-16 DIAGNOSIS — M797 Fibromyalgia: Secondary | ICD-10-CM

## 2020-07-16 NOTE — Telephone Encounter (Signed)
Please review. Last office visit 05/2020.  KP

## 2020-07-23 MED FILL — OTEZLA 30 MG TABS: 30 | 30 days supply | Qty: 60 | Fill #3

## 2020-07-26 ENCOUNTER — Telehealth: Payer: Self-pay | Admitting: Internal Medicine

## 2020-07-26 DIAGNOSIS — G4700 Insomnia, unspecified: Secondary | ICD-10-CM

## 2020-07-26 NOTE — Telephone Encounter (Signed)
Patient would like the nurse to call her regarding her sleep medication.  She stated that the doctor told her to go back on the medication, but the pharmacy said that the script has not been sent.  Please advise and call patient to discuss at [phone]

## 2020-07-26 NOTE — Telephone Encounter (Signed)
I need you to call the patient and see what exactly she is talking about.  Her last note does not mention anything about sleep meds.  Her Doxepin was refilled 07/13/20.  I can not find any notes where I told her to resume it.

## 2020-07-27 ENCOUNTER — Other Ambulatory Visit: Payer: Self-pay

## 2020-07-27 MED ORDER — TRAZODONE HCL 50 MG PO TABS: 100.0000 mg | ORAL_TABLET | Freq: Every day | ORAL | 2 refills | Status: DC

## 2020-07-27 NOTE — Telephone Encounter (Signed)
Called and spoke with patient. She said she took Doxepin and Trazodone together for years and at the end of the last visit she discussed with Dr. Army Melia "going out the door" about her not sleeping and going back on trazodone and Dr. Army Melia told her its okay to start back on it. Dr. Army Melia gave the okay verbally to refill patients 100mg  at bedtime today. Patient informed.

## 2020-08-27 ENCOUNTER — Ambulatory Visit: Payer: No Typology Code available for payment source | Admitting: Dermatology

## 2020-08-31 ENCOUNTER — Other Ambulatory Visit (HOSPITAL_COMMUNITY): Payer: Self-pay

## 2020-09-05 ENCOUNTER — Telehealth: Payer: Self-pay | Admitting: Internal Medicine

## 2020-09-05 NOTE — Telephone Encounter (Signed)
Pt had to cancel her appt for Monday.  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 Pt states if Dr Carolin Coy needs to see her for meds, it will need to be virtual, because she does not know when she is coming back.  Also pt needs a prior auth on her nexium.  Pt changed to the choice [name] plan this year.  She is going to send [name] with new card info front and back.

## 2020-09-06 NOTE — Telephone Encounter (Signed)
Sent patient a message requesting her new insurance card to be sent because I cannot do a PA until I receive her new insurance info.

## 2020-09-10 ENCOUNTER — Ambulatory Visit: Payer: No Typology Code available for payment source | Admitting: Internal Medicine

## 2020-09-19 ENCOUNTER — Other Ambulatory Visit: Payer: Self-pay

## 2020-10-15 ENCOUNTER — Other Ambulatory Visit: Payer: Self-pay

## 2020-10-16 ENCOUNTER — Encounter: Payer: Self-pay | Admitting: Internal Medicine

## 2020-10-16 ENCOUNTER — Other Ambulatory Visit: Payer: Self-pay | Admitting: Internal Medicine

## 2020-10-16 ENCOUNTER — Encounter: Payer: Self-pay | Admitting: Dermatology

## 2020-10-16 DIAGNOSIS — Z1231 Encounter for screening mammogram for malignant neoplasm of breast: Secondary | ICD-10-CM

## 2020-10-17 ENCOUNTER — Other Ambulatory Visit: Payer: Self-pay | Admitting: Obstetrics and Gynecology

## 2020-10-17 ENCOUNTER — Telehealth: Payer: Self-pay

## 2020-10-17 DIAGNOSIS — Z1509 Genetic susceptibility to other malignant neoplasm: Secondary | ICD-10-CM

## 2020-10-17 DIAGNOSIS — Z1501 Genetic susceptibility to malignant neoplasm of breast: Secondary | ICD-10-CM

## 2020-10-17 DIAGNOSIS — N950 Postmenopausal bleeding: Secondary | ICD-10-CM

## 2020-10-17 DIAGNOSIS — Z298 Encounter for other specified prophylactic measures: Secondary | ICD-10-CM

## 2020-10-17 MED ORDER — TAMOXIFEN CITRATE 20 MG PO TABS: 20.0000 mg | ORAL_TABLET | Freq: Every day | ORAL | 3 refills | Status: DC

## 2020-10-17 NOTE — Telephone Encounter (Signed)
I sent the Rx. She needs an annual appointment since it has been a year since I last saw her.

## 2020-10-17 NOTE — Telephone Encounter (Signed)
Pt aware.

## 2020-10-17 NOTE — Telephone Encounter (Signed)
Completed PA on patients Nexium medication.  [name]  PA Case ID: [id number]  Awaiting outcome from [facility].

## 2020-10-17 NOTE — Telephone Encounter (Signed)
Patient is scheduled for 01/08/21 with CRS

## 2020-10-17 NOTE — Telephone Encounter (Signed)
Pt calling; needs new rx for a 90d supply of Tamoxifen with refills; is now in [place]; [facility] so [facility]; they won't contact us b/c they haven't filled it for her before.  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  Asked pt for [facility] info there so rx could be sent directly to them.  [place], [place], [place].  Pharm changed in chart. Pt aware CRS not in office today but will be in tomorrow.

## 2020-10-18 NOTE — Telephone Encounter (Signed)
PA was DENIED. Will send patient a message to inform.

## 2020-11-07 ENCOUNTER — Other Ambulatory Visit (HOSPITAL_COMMUNITY): Payer: Self-pay

## 2020-11-21 ENCOUNTER — Telehealth: Payer: Self-pay

## 2020-11-21 NOTE — Telephone Encounter (Signed)
Patient [place] currently out of [place]. Patient is unable to complete a VV with Dr Army Melia because she is out of state per our office guidelines.   We received patients compliance report from her CPAP.  Compliance report shows patient has used her CPAP 78 out of 90 days. On average she is using her CPAP 7 hrs and 18 mins a night.   I will send this note along with compliance report to [employer] to see if they will approve continuing patient CPAP supplies.

## 2020-12-05 ENCOUNTER — Other Ambulatory Visit: Payer: Self-pay

## 2020-12-07 ENCOUNTER — Other Ambulatory Visit: Payer: Self-pay

## 2020-12-07 ENCOUNTER — Other Ambulatory Visit: Payer: Self-pay | Admitting: Internal Medicine

## 2020-12-07 DIAGNOSIS — I10 Essential (primary) hypertension: Secondary | ICD-10-CM

## 2020-12-07 MED ORDER — LEVOTHYROXINE SODIUM 200 MCG PO TABS
200.0000 ug | ORAL_TABLET | Freq: Every day | ORAL | 0 refills | Status: DC
  Filled 2020-12-07: qty 30, 30d supply, fill #0

## 2020-12-07 MED ORDER — NEBIVOLOL HCL 20 MG PO TABS
20.0000 mg | ORAL_TABLET | Freq: Every day | ORAL | 0 refills | Status: DC
  Filled 2020-12-07: qty 30, 30d supply, fill #0

## 2020-12-07 MED ORDER — MELOXICAM 15 MG PO TABS
15.0000 mg | ORAL_TABLET | Freq: Every day | ORAL | 0 refills | Status: DC
  Filled 2020-12-07: qty 30, 30d supply, fill #0

## 2020-12-07 MED ORDER — DOXAZOSIN MESYLATE 2 MG PO TABS
2.0000 mg | ORAL_TABLET | Freq: Every day | ORAL | 0 refills | Status: DC
  Filled 2020-12-07: qty 30, 30d supply, fill #0

## 2020-12-07 NOTE — Telephone Encounter (Signed)
Notes to clinic:  Patient requesting refills Looks  like attempt was made to schedule appt but patient couldn't at the time    Requested Prescriptions  Pending Prescriptions Disp Refills   Nebivolol HCl 20 MG TABS 90 tablet 1    Sig: Take 1 tablet (20 mg total) by mouth daily.      Cardiovascular:  Beta Blockers Failed - 12/07/2020  2:45 PM      Failed - Last BP in normal range    BP Readings from Last 1 Encounters:  05/09/20 (!) 142/98          Failed - Valid encounter within last 6 months    Recent Outpatient Visits           7 months ago Essential (primary) hypertension   [facility] Glean Hess, MD   7 months ago Encounter for medication review   [place], [name]   1 year ago Annual physical exam   [facility] Glean Hess, MD   1 year ago Essential (primary) hypertension   [facility] Glean Hess, MD   2 years ago Annual physical exam   [facility] Glean Hess, MD                Passed - Last Heart Rate in normal range    Pulse Readings from Last 1 Encounters:  05/09/20 80            meloxicam (MOBIC) 15 MG tablet 90 tablet 3    Sig: Take 1 tablet (15 mg total) by mouth daily.      Analgesics:  COX2 Inhibitors Failed - 12/07/2020  2:45 PM      Failed - HGB in normal range and within 360 days    Hemoglobin  Date Value Ref Range Status  11/21/2019 13.4 11.1 - 15.9 g/dL Final          Failed - Cr in normal range and within 360 days    Creatinine  Date Value Ref Range Status  08/21/2013 0.66 0.60 - 1.30 mg/dL Final   Creatinine, Ser  Date Value Ref Range Status  11/21/2019 0.53 (L) 0.57 - 1.00 mg/dL Final          Passed - Patient is not pregnant      Passed - Valid encounter within last 12 months    Recent Outpatient Visits           7 months ago Essential (primary) hypertension   [facility] Glean Hess,  MD   7 months ago Encounter for medication review   [place], [name]   1 year ago Annual physical exam   [facility] Glean Hess, MD   1 year ago Essential (primary) hypertension   [facility] Glean Hess, MD   2 years ago Annual physical exam   [facility] Glean Hess, MD                  levothyroxine (SYNTHROID) 200 MCG tablet 90 tablet 3    Sig: Take 1 tablet (200 mcg total) by mouth daily.      Endocrinology:  Hypothyroid Agents Failed - 12/07/2020  2:45 PM      Failed - TSH needs to be rechecked within 3 months after an abnormal result. Refill until TSH is due.  Failed - TSH in normal range and within 360 days    TSH  Date Value Ref Range Status  11/21/2019 2.870 0.450 - 4.500 uIU/mL Final          Passed - Valid encounter within last 12 months    Recent Outpatient Visits           7 months ago Essential (primary) hypertension   [facility] Glean Hess, MD   7 months ago Encounter for medication review   [place], [name]   1 year ago Annual physical exam   [facility] Glean Hess, MD   1 year ago Essential (primary) hypertension   [facility] Glean Hess, MD   2 years ago Annual physical exam   [facility] Glean Hess, MD                  doxazosin (CARDURA) 2 MG tablet 90 tablet 1    Sig: Take 1 tablet (2 mg total) by mouth daily.      Cardiovascular:  Alpha Blockers Failed - 12/07/2020  2:45 PM      Failed - Last BP in normal range    BP Readings from Last 1 Encounters:  05/09/20 (!) 142/98          Failed - Valid encounter within last 6 months    Recent Outpatient Visits           7 months ago Essential (primary) hypertension   [facility] Glean Hess, MD   7 months ago Encounter for medication  review   [place], [name]   1 year ago Annual physical exam   [facility] Glean Hess, MD   1 year ago Essential (primary) hypertension   [facility] Glean Hess, [facility]   2 years ago Annual physical exam   [facility] Glean Hess, MD

## 2020-12-07 NOTE — Telephone Encounter (Signed)
Medication Refill - Medication: meloxicam (MOBIC) 15 MG tablet nebivolol 20 MG TABS  levothyroxine (SYNTHROID) 200 MCG tablet doxazosin (CARDURA) 2 MG tablet    Pt called to report that the pharmacy has sent fax requests for refills that have been returned with nothing specified. Pt is completely out of her current supply for over a week and a half.   Has the patient contacted their pharmacy? Yes.   (Agent: If no, request that the patient contact the pharmacy for the refill.) (Agent: If yes, when and what did the pharmacy advise?)  Preferred Pharmacy (with phone number or street name):  [facility] [place] [address]  Phone: [phone] Fax: [phone]    Agent: Please be advised that RX refills may take up to 3 business days. We ask that you follow-up with your pharmacy.

## 2020-12-11 ENCOUNTER — Other Ambulatory Visit: Payer: Self-pay | Admitting: Internal Medicine

## 2020-12-11 ENCOUNTER — Other Ambulatory Visit: Payer: Self-pay

## 2020-12-11 DIAGNOSIS — M797 Fibromyalgia: Secondary | ICD-10-CM

## 2020-12-11 DIAGNOSIS — I10 Essential (primary) hypertension: Secondary | ICD-10-CM

## 2020-12-11 MED ORDER — TRAMADOL HCL 50 MG PO TABS: ORAL_TABLET | Freq: Two times a day (BID) | ORAL | 0 refills | Status: DC | PRN

## 2020-12-11 NOTE — Telephone Encounter (Signed)
Notes to clinic:  Requesting 90 day supply   Requested Prescriptions  Pending Prescriptions Disp Refills   levothyroxine (SYNTHROID) 200 MCG tablet [Pharmacy Med Name: LEVOTHYROXINE 0.2MG  (200MCG) TAB] 90 tablet     Sig: TAKE 1 TABLET BY MOUTH EVERY DAY      Endocrinology:  Hypothyroid Agents Failed - 12/11/2020 12:32 PM      Failed - TSH needs to be rechecked within 3 months after an abnormal result. Refill until TSH is due.      Failed - TSH in normal range and within 360 days    TSH  Date Value Ref Range Status  11/21/2019 2.870 0.450 - 4.500 uIU/mL Final          Passed - Valid encounter within last 12 months    Recent Outpatient Visits           7 months ago Essential (primary) hypertension   [facility] Glean Hess, MD   7 months ago Encounter for medication review   [place], [name]   1 year ago Annual physical exam   [facility] Glean Hess, MD   1 year ago Essential (primary) hypertension   [facility] Glean Hess, [facility]   2 years ago Annual physical exam   [facility] Glean Hess, MD                  Nebivolol HCl 20 MG TABS [Pharmacy Med Name: NEBIVOLOL 20MG  TABLETS] 90 tablet     Sig: TAKE 1 TABLET BY MOUTH EVERY DAY      Cardiovascular:  Beta Blockers Failed - 12/11/2020 12:32 PM      Failed - Last BP in normal range    BP Readings from Last 1 Encounters:  05/09/20 (!) 142/98          Failed - Valid encounter within last 6 months    Recent Outpatient Visits           7 months ago Essential (primary) hypertension   [facility] Glean Hess, MD   7 months ago Encounter for medication review   [place], [name]   1 year ago Annual physical exam   [facility] Glean Hess, MD   1 year ago Essential (primary) hypertension   [facility]  Glean Hess, MD   2 years ago Annual physical exam   [facility] Glean Hess, MD                Passed - Last Heart Rate in normal range    Pulse Readings from Last 1 Encounters:  05/09/20 80            doxazosin (CARDURA) 2 MG tablet [Pharmacy Med Name: DOXAZOSIN 2MG  TABLETS] 90 tablet     Sig: TAKE 1 TABLET BY MOUTH EVERY DAY      Cardiovascular:  Alpha Blockers Failed - 12/11/2020 12:32 PM      Failed - Last BP in normal range    BP Readings from Last 1 Encounters:  05/09/20 (!) 142/98          Failed - Valid encounter within last 6 months    Recent Outpatient Visits           7 months ago Essential (primary) hypertension   [facility] Glean Hess, MD  7 months ago Encounter for medication review   [place], [name]   1 year ago Annual physical exam   [facility] Glean Hess, MD   1 year ago Essential (primary) hypertension   [facility] Glean Hess, MD   2 years ago Annual physical exam   [facility] Glean Hess, MD

## 2020-12-11 NOTE — Telephone Encounter (Signed)
Please review. Last office visit 10/16/2020.  KP

## 2020-12-20 ENCOUNTER — Other Ambulatory Visit: Payer: Self-pay | Admitting: Internal Medicine

## 2020-12-20 DIAGNOSIS — M62838 Other muscle spasm: Secondary | ICD-10-CM

## 2020-12-20 MED ORDER — CYCLOBENZAPRINE HCL 10 MG PO TABS
ORAL_TABLET | ORAL | 0 refills | Status: DC
Start: 2020-12-20 — End: 2021-03-25

## 2021-01-02 ENCOUNTER — Other Ambulatory Visit (HOSPITAL_COMMUNITY): Payer: Self-pay

## 2021-01-02 MED FILL — Apremilast Tab 30 MG: ORAL | 30 days supply | Qty: 60 | Fill #0 | Status: CN

## 2021-01-04 ENCOUNTER — Ambulatory Visit
Admission: RE | Admit: 2021-01-04 | Discharge: 2021-01-04 | Disposition: A | Discharge status: Home / Self Care | Payer: Commercial Managed Care - PPO | Source: Ambulatory Visit | Attending: Internal Medicine | Admitting: Internal Medicine

## 2021-01-04 ENCOUNTER — Other Ambulatory Visit: Payer: Self-pay

## 2021-01-04 DIAGNOSIS — Z1231 Encounter for screening mammogram for malignant neoplasm of breast: Secondary | ICD-10-CM | POA: Insufficient documentation

## 2021-01-07 ENCOUNTER — Other Ambulatory Visit: Payer: Self-pay | Admitting: Internal Medicine

## 2021-01-07 DIAGNOSIS — I10 Essential (primary) hypertension: Secondary | ICD-10-CM

## 2021-01-07 NOTE — Telephone Encounter (Signed)
Requested medication (s) are due for refill today: Yes  Requested medication (s) are on the active medication list: Yes  Last refill:  12/07/20  Future visit scheduled: Yes  Notes to clinic: Unable to refill per protocol, appointment needed, courtesy refill given    Requested Prescriptions  Pending Prescriptions Disp Refills   Nebivolol HCl 20 MG TABS [Pharmacy Med Name: NEBIVOLOL '20MG'$  TABLETS] 90 tablet     Sig: TAKE 1 TABLET BY MOUTH EVERY DAY      Cardiovascular:  Beta Blockers Failed - 01/07/2021 11:26 AM      Failed - Last BP in normal range    BP Readings from Last 1 Encounters:  05/09/20 (!) 142/98          Failed - Valid encounter within last 6 months    Recent Outpatient Visits           8 months ago Essential (primary) hypertension   [facility] Glean Hess, MD   8 months ago Encounter for medication review   [place], [name]   1 year ago Annual physical exam   [facility] Glean Hess, MD   1 year ago Essential (primary) hypertension   [facility] Glean Hess, MD   2 years ago Annual physical exam   [facility], [facility]       Future Appointments             In 2 days Glean Hess, MD [facility]             Passed - Last Heart Rate in normal range    Pulse Readings from Last 1 Encounters:  05/09/20 80            levothyroxine (SYNTHROID) 200 MCG tablet [Pharmacy Med Name: LEVOTHYROXINE 0.'2MG'$  (200MCG) TAB] 90 tablet     Sig: TAKE 1 TABLET BY MOUTH EVERY DAY      Endocrinology:  Hypothyroid Agents Failed - 01/07/2021 11:26 AM      Failed - TSH needs to be rechecked within 3 months after an abnormal result. Refill until TSH is due.      Failed - TSH in normal range and within 360 days    TSH  Date Value Ref Range Status  11/21/2019 2.870 0.450 - 4.500 uIU/mL Final          Passed - Valid encounter  within last 12 months    Recent Outpatient Visits           8 months ago Essential (primary) hypertension   [facility] Glean Hess, MD   8 months ago Encounter for medication review   [place], [name]   1 year ago Annual physical exam   [facility] Glean Hess, MD   1 year ago Essential (primary) hypertension   [facility] Glean Hess, MD   2 years ago Annual physical exam   [facility] Glean Hess, MD       Future Appointments             In 2 days Glean Hess, MD [facility]

## 2021-01-08 ENCOUNTER — Ambulatory Visit: Payer: No Typology Code available for payment source | Admitting: Obstetrics and Gynecology

## 2021-01-09 ENCOUNTER — Ambulatory Visit (INDEPENDENT_AMBULATORY_CARE_PROVIDER_SITE_OTHER): Payer: Self-pay | Admitting: Internal Medicine

## 2021-01-09 ENCOUNTER — Encounter: Payer: Self-pay | Admitting: Internal Medicine

## 2021-01-09 ENCOUNTER — Other Ambulatory Visit: Payer: Self-pay

## 2021-01-09 VITALS — BP 136/84 | HR 75 | Ht 61.0 in | Wt 221.0 lb

## 2021-01-09 DIAGNOSIS — E782 Mixed hyperlipidemia: Secondary | ICD-10-CM | POA: Diagnosis not present

## 2021-01-09 DIAGNOSIS — M797 Fibromyalgia: Secondary | ICD-10-CM

## 2021-01-09 DIAGNOSIS — K580 Irritable bowel syndrome with diarrhea: Secondary | ICD-10-CM

## 2021-01-09 DIAGNOSIS — I10 Essential (primary) hypertension: Secondary | ICD-10-CM

## 2021-01-09 DIAGNOSIS — J3089 Other allergic rhinitis: Secondary | ICD-10-CM

## 2021-01-09 DIAGNOSIS — E039 Hypothyroidism, unspecified: Secondary | ICD-10-CM

## 2021-01-09 DIAGNOSIS — Z6841 Body Mass Index (BMI) 40.0 and over, adult: Secondary | ICD-10-CM

## 2021-01-09 DIAGNOSIS — L409 Psoriasis, unspecified: Secondary | ICD-10-CM

## 2021-01-09 DIAGNOSIS — G4733 Obstructive sleep apnea (adult) (pediatric): Secondary | ICD-10-CM

## 2021-01-09 DIAGNOSIS — R7303 Prediabetes: Secondary | ICD-10-CM | POA: Diagnosis not present

## 2021-01-09 LAB — POCT URINALYSIS DIPSTICK
Bilirubin, UA: NEGATIVE
Blood, UA: NEGATIVE
Glucose, UA: NEGATIVE
Ketones, UA: NEGATIVE
Leukocytes, UA: NEGATIVE
Nitrite, UA: NEGATIVE
Protein, UA: NEGATIVE
Spec Grav, UA: 1.02 (ref 1.010–1.025)
Urobilinogen, UA: 0.2 E.U./dL
pH, UA: 6 (ref 5.0–8.0)

## 2021-01-09 MED ORDER — ESOMEPRAZOLE MAGNESIUM 40 MG PO CPDR: 40.0000 mg | DELAYED_RELEASE_CAPSULE | Freq: Every day | ORAL | 5 refills | Status: DC

## 2021-01-09 MED ORDER — LEVOTHYROXINE SODIUM 200 MCG PO TABS: 200.0000 ug | ORAL_TABLET | Freq: Every day | ORAL | 5 refills | Status: DC

## 2021-01-09 MED ORDER — NEBIVOLOL HCL 20 MG PO TABS: 20.0000 mg | ORAL_TABLET | Freq: Every day | ORAL | 5 refills | Status: DC

## 2021-01-09 MED ORDER — MELOXICAM 15 MG PO TABS: 15.0000 mg | ORAL_TABLET | Freq: Every day | ORAL | 5 refills | Status: DC

## 2021-01-09 MED ORDER — DOXAZOSIN MESYLATE 2 MG PO TABS: 2.0000 mg | ORAL_TABLET | Freq: Every day | ORAL | 5 refills | Status: DC

## 2021-01-09 NOTE — Progress Notes (Signed)
Date:  01/09/2021   Name:  [name]   DOB:  Oct 13, 1960   MRN:  [id number]   Chief Complaint: Hypertension, Hypothyroidism, and Diabetes  Hypertension This is a chronic problem. The problem is controlled. Pertinent negatives include no chest pain, headaches, palpitations or shortness of breath. Past treatments include ACE inhibitors, central alpha agonists and beta blockers. There are no compliance problems.  Identifiable causes of hypertension include a thyroid problem.  Thyroid Problem Presents for follow-up visit. Symptoms include diarrhea. Patient reports no anxiety, constipation, fatigue or palpitations. The symptoms have been stable.  Diabetes She presents for her follow-up diabetic visit. Diabetes type: prediabetes. Her disease course has been stable. Pertinent negatives for hypoglycemia include no dizziness, headaches or nervousness/anxiousness. Pertinent negatives for diabetes include no chest pain, no fatigue and no weakness.  Gastroesophageal Reflux She reports no abdominal pain, no chest pain, no coughing or no wheezing. Pertinent negatives include no fatigue.  OSA - using CPap regularly.  She feels more rested in the morning when she is able to sleep.  Sleep is disrupted by chronic pain from fibromyalgia and suspected psoriatic arthritis. Review of download - Used for more than 4 hours on 78/90 days.  Avg use 6 hr, 20 min. Fibromyalgia - did well with horizant but not covered.  Now on gabapentin but it is not helping as much as previously.  She takes tramadol occasionally. Psoriasis - on Otezla and has good control of skin disease.  She thinks that it also helps her joint pain although she is still very limited and sedentary in activity.   Lab Results  Component Value Date   CREATININE 0.53 (L) 11/21/2019   BUN 11 11/21/2019   NA 141 11/21/2019   K 4.0 11/21/2019   CL 102 11/21/2019   CO2 25 11/21/2019   Lab Results  Component Value Date   CHOL 185 11/21/2019    HDL 49 11/21/2019   LDLCALC 100 (H) 11/21/2019   TRIG 212 (H) 11/21/2019   CHOLHDL 3.8 11/21/2019   Lab Results  Component Value Date   TSH 2.870 11/21/2019   Lab Results  Component Value Date   HGBA1C 6.2 (H) 05/09/2020   Lab Results  Component Value Date   WBC 9.6 11/21/2019   HGB 13.4 11/21/2019   HCT 39.7 11/21/2019   MCV 93 11/21/2019   PLT 345 11/21/2019   Lab Results  Component Value Date   ALT 17 11/21/2019   AST 20 11/21/2019   ALKPHOS 82 11/21/2019   BILITOT 0.4 11/21/2019     Review of Systems  Constitutional:  Negative for appetite change, fatigue and unexpected weight change.  HENT:  Positive for rhinorrhea and sneezing. Negative for nosebleeds.   Eyes:  Negative for visual disturbance.  Respiratory:  Negative for cough, chest tightness, shortness of breath and wheezing.   Cardiovascular:  Negative for chest pain, palpitations and leg swelling.  Gastrointestinal:  Positive for diarrhea. Negative for abdominal pain and constipation.  Musculoskeletal:  Positive for arthralgias, gait problem and myalgias.  Allergic/Immunologic: Positive for environmental allergies.  Neurological:  Negative for dizziness, weakness, light-headedness and headaches.  Psychiatric/Behavioral:  Positive for sleep disturbance. Negative for dysphoric mood. The patient is not nervous/anxious.    Patient Active Problem List   Diagnosis Date Noted   S/P hysterectomy with oophorectomy 05/19/2019   BRCA2 gene mutation positive 03/03/2019   Pre-diabetes 09/16/2016   Insomnia 09/16/2016   Arthritis of carpometacarpal (CMC) joint of thumb 05/29/2016  Venous insufficiency of both lower extremities 01/17/2016   Acquired hypothyroidism 11/10/2014   Essential (primary) hypertension 11/10/2014   Fibromyalgia syndrome 11/10/2014   Mixed hyperlipidemia 11/10/2014   Irritable bowel syndrome with diarrhea 11/10/2014   Asthma, mild intermittent 11/10/2014   BMI 40.0-44.9, adult ([place])  11/10/2014   Chronic peptic ulcer 11/10/2014   Psoriasis 11/10/2014   Obstructive apnea 11/10/2014   Chest pain 07/19/2012   Environmental and seasonal allergies 09/08/2011    Allergies  Allergen Reactions   Sulfa Antibiotics Other (See Comments)    seizure   Amlodipine Swelling    Leg swelling   Furosemide Swelling   Lyrica [Pregabalin] Other (See Comments)    Dizziness and blurred vision   Hydrochlorothiazide Itching and Rash    Past Surgical History:  Procedure Laterality Date   APPENDECTOMY     BREAST BIOPSY Left 2007   benign, dr Bary Castilla   CHOLECYSTECTOMY     CYSTOSCOPY  05/19/2019   Procedure: CYSTOSCOPY;  Surgeon: Homero Fellers, MD;  Location: [facility] ORS;  Service: Gynecology;;   HYSTEROSCOPY WITH D & C N/A 12/14/2018   Procedure: DILATATION AND CURETTAGE /HYSTEROSCOPY WITH MYOSURE, VULVAR BIOPSY;  Surgeon: Homero Fellers, MD;  Location: [facility] ORS;  Service: Gynecology;  Laterality: N/A;   SKIN BIOPSY Left    Back on L leg    SKIN CANCER EXCISION     BCCA of face   TONSILLECTOMY AND ADENOIDECTOMY     TOTAL LAPAROSCOPIC HYSTERECTOMY WITH SALPINGECTOMY N/A 05/19/2019   Procedure: TOTAL LAPAROSCOPIC HYSTERECTOMY WITH SALPINGECTOMY/ TLH/BSO;  Surgeon: Homero Fellers, MD;  Location: [facility] ORS;  Service: Gynecology;  Laterality: N/A;    Social History   Tobacco Use   Smoking status: Never   Smokeless tobacco: Never  Vaping Use   Vaping Use: Never used  Substance Use Topics   Alcohol use: Yes    Alcohol/week: 2.0 standard drinks    Types: 2 Standard drinks or equivalent per week    Comment: rarely   Drug use: No     Medication list has been reviewed and updated.  Current Meds  Medication Sig   albuterol (VENTOLIN HFA) 108 (90 Base) MCG/ACT inhaler Inhale 2 puffs into the lungs 4 (four) times daily as needed.   Apremilast 30 MG TABS TAKE 1 TABLET BY MOUTH 2 TIMES DAILY. (Patient taking differently: Take 1 tablet by mouth 2 (two) times daily.  OTEZLA)   aspirin 81 MG EC tablet Take 81 mg by mouth at bedtime.    blood glucose meter kit and supplies KIT Dispense based on patient and insurance preference. Use up to four times daily as directed. (FOR ICD-9 250.00, 250.01).   cetirizine (ZYRTEC) 10 MG tablet Take 10 mg by mouth every morning.    cyclobenzaprine (FLEXERIL) 10 MG tablet TAKE 1 & 1/2 TABLETS (15 MG) BY MOUTH IN THE MORNING AND AT BEDTIME.   diphenhydrAMINE (BENADRYL) 25 MG tablet Take 25-50 mg by mouth every 6 (six) hours as needed (allergies/asthma).   doxazosin (CARDURA) 2 MG tablet Take 1 tablet (2 mg total) by mouth daily.   doxepin (SINEQUAN) 100 MG capsule TAKE 1 CAPSULE BY MOUTH ONCE DAILY AT BEDTIME   econazole nitrate 1 % cream Apply under breasts and in groin creases 1-2 times a day as needed.   esomeprazole (NEXIUM) 40 MG capsule Take 1 capsule (40 mg total) by mouth daily before supper. (1700)   Fluocinolone Acetonide 0.01 % OIL Apply 1-2 drops to ear canals 1-2 times  daily as needed for psoriasis.   fluticasone (FLONASE) 50 MCG/ACT nasal spray Place 2 sprays into both nostrils daily.   FREESTYLE LITE test strip    Lancets (FREESTYLE) lancets 1 each 4 (four) times daily.   levothyroxine (SYNTHROID) 200 MCG tablet Take 1 tablet (200 mcg total) by mouth daily.   lisinopril (ZESTRIL) 40 MG tablet TAKE 1 TABLET BY MOUTH DAILY   loperamide (IMODIUM A-D) 2 MG tablet Take 2-4 mg by mouth 4 (four) times daily as needed for diarrhea or loose stools.    meloxicam (MOBIC) 15 MG tablet Take 1 tablet (15 mg total) by mouth daily.   montelukast (SINGULAIR) 10 MG tablet TAKE 1 TABLET BY MOUTH NIGHTLY AT BEDTIME   Nebivolol HCl 20 MG TABS Take 1 tablet (20 mg total) by mouth daily.   OVER THE COUNTER MEDICATION Woman's multvitamin-Take 1 gummie daily.   tamoxifen (NOLVADEX) 20 MG tablet Take 1 tablet (20 mg total) by mouth daily.   traMADol (ULTRAM) 50 MG tablet TAKE 1 TABLET BY MOUTH EVERY 12 HOURS AS NEEDED   traZODone  (DESYREL) 50 MG tablet Take 2 tablets (100 mg total) by mouth at bedtime.   vitamin B-12 (CYANOCOBALAMIN) 1000 MCG tablet Take 1500 mcg-2 gummie daily.    PHQ 2/9 Scores 01/09/2021 05/09/2020 11/21/2019 05/13/2019  PHQ - 2 Score 1 0 2 0  PHQ- 9 Score 7 3 8 -    GAD 7 : Generalized Anxiety Score 01/09/2021 05/09/2020 11/21/2019  Nervous, Anxious, on Edge 0 0 0  Control/stop worrying 0 0 0  Worry too much - different things 0 0 0  Trouble relaxing 1 0 0  Restless 0 0 0  Easily annoyed or irritable 1 0 0  Afraid - awful might happen 0 0 0  Total GAD 7 Score 2 0 0  Anxiety Difficulty Not difficult at all - Not difficult at all    BP Readings from Last 3 Encounters:  01/09/21 (!) 138/96  05/09/20 (!) 142/98  11/21/19 138/90    Physical Exam Vitals and nursing note reviewed.  Constitutional:      General: She is not in acute distress.    Appearance: She is well-developed. She is obese.  HENT:     Head: Normocephalic and atraumatic.  Cardiovascular:     Rate and Rhythm: Normal rate and regular rhythm.     Pulses: Normal pulses.  Pulmonary:     Effort: Pulmonary effort is normal. No respiratory distress.     Breath sounds: No wheezing or rhonchi.  Musculoskeletal:     Cervical back: Normal range of motion.     Right lower leg: No edema.     Left lower leg: No edema.  Lymphadenopathy:     Cervical: No cervical adenopathy.  Skin:    General: Skin is warm and dry.     Capillary Refill: Capillary refill takes less than 2 seconds.     Findings: No rash.  Neurological:     Mental Status: She is alert and oriented to person, place, and time.  Psychiatric:        Mood and Affect: Mood normal.        Behavior: Behavior normal.    Wt Readings from Last 3 Encounters:  01/09/21 221 lb (100.2 kg)  05/09/20 227 lb (103 kg)  11/21/19 226 lb (102.5 kg)    BP (!) 138/96 (BP Location: Right Arm, Patient Position: Sitting, Cuff Size: Large)   Pulse 75   Ht 5' 1" (1.549   m)   Wt 221 lb  (100.2 kg)   SpO2 95%   BMI 41.76 kg/m   Assessment and Plan: 1. Essential (primary) hypertension Clinically stable exam with well controlled BP. Tolerating medications without side effects at this time. Pt to continue current regimen and low sodium diet; benefits of regular exercise as able discussed. - CBC with Differential/Platelet - POCT urinalysis dipstick - Nebivolol HCl 20 MG TABS; Take 1 tablet (20 mg total) by mouth daily.  Dispense: 30 tablet; Refill: 5 - doxazosin (CARDURA) 2 MG tablet; Take 1 tablet (2 mg total) by mouth daily.  Dispense: 30 tablet; Refill: 5  2. Acquired hypothyroidism supplemented - TSH + free T4 - levothyroxine (SYNTHROID) 200 MCG tablet; Take 1 tablet (200 mcg total) by mouth daily.  Dispense: 30 tablet; Refill: 5  3. Pre-diabetes Continue work on diet and weight loss - Comprehensive metabolic panel - Hemoglobin A1c  4. Mixed hyperlipidemia Labs today - will advise - Lipid panel  5. BMI 40.0-44.9, adult [facility]) Chronic medical issues interfere with ability to exericse  6. Irritable bowel syndrome with diarrhea Stopped questran.  Symptoms are stable at this time.  7. Psoriasis On Otezla  8. Fibromyalgia syndrome Continue Tramadol PRN Check on coverage of Horizant with insurance. - meloxicam (MOBIC) 15 MG tablet; Take 1 tablet (15 mg total) by mouth daily.  Dispense: 30 tablet; Refill: 5   Partially dictated using Editor, commissioning. Any errors are unintentional.  Halina Maidens, MD [facility]  01/09/2021

## 2021-01-10 ENCOUNTER — Encounter: Payer: Self-pay | Admitting: Internal Medicine

## 2021-01-10 LAB — CBC WITH DIFFERENTIAL/PLATELET
Basophils Absolute: 0.1 10*3/uL (ref 0.0–0.2)
Basos: 1 %
EOS (ABSOLUTE): 0.3 10*3/uL (ref 0.0–0.4)
Eos: 3 %
Hematocrit: 41.9 % (ref 34.0–46.6)
Hemoglobin: 13.8 g/dL (ref 11.1–15.9)
Immature Grans (Abs): 0.1 10*3/uL (ref 0.0–0.1)
Immature Granulocytes: 1 %
Lymphocytes Absolute: 3.8 10*3/uL — ABNORMAL HIGH (ref 0.7–3.1)
Lymphs: 36 %
MCH: 30.3 pg (ref 26.6–33.0)
MCHC: 32.9 g/dL (ref 31.5–35.7)
MCV: 92 fL (ref 79–97)
Monocytes Absolute: 0.6 10*3/uL (ref 0.1–0.9)
Monocytes: 5 %
Neutrophils Absolute: 5.8 10*3/uL (ref 1.4–7.0)
Neutrophils: 54 %
Platelets: 338 10*3/uL (ref 150–450)
RBC: 4.56 x10E6/uL (ref 3.77–5.28)
RDW: 11.9 % (ref 11.7–15.4)
WBC: 10.6 10*3/uL (ref 3.4–10.8)

## 2021-01-10 LAB — LIPID PANEL
Chol/HDL Ratio: 3.8 ratio (ref 0.0–4.4)
Cholesterol, Total: 200 mg/dL — ABNORMAL HIGH (ref 100–199)
HDL: 52 mg/dL (ref >39)
LDL Chol Calc (NIH): 108 mg/dL — ABNORMAL HIGH (ref 0–99)
Triglycerides: 231 mg/dL — ABNORMAL HIGH (ref 0–149)
VLDL Cholesterol Cal: 40 mg/dL (ref 5–40)

## 2021-01-10 LAB — COMPREHENSIVE METABOLIC PANEL
ALT: 14 IU/L (ref 0–32)
AST: 21 IU/L (ref 0–40)
Albumin/Globulin Ratio: 1.3 (ref 1.2–2.2)
Albumin: 4 g/dL (ref 3.8–4.8)
Alkaline Phosphatase: 95 IU/L (ref 44–121)
BUN/Creatinine Ratio: 21 (ref 12–28)
BUN: 13 mg/dL (ref 8–27)
Bilirubin Total: 0.3 mg/dL (ref 0.0–1.2)
CO2: 25 mmol/L (ref 20–29)
Calcium: 9.5 mg/dL (ref 8.7–10.3)
Chloride: 99 mmol/L (ref 96–106)
Creatinine, Ser: 0.62 mg/dL (ref 0.57–1.00)
Globulin, Total: 3 g/dL (ref 1.5–4.5)
Glucose: 112 mg/dL — ABNORMAL HIGH (ref 65–99)
Potassium: 4.6 mmol/L (ref 3.5–5.2)
Sodium: 141 mmol/L (ref 134–144)
Total Protein: 7 g/dL (ref 6.0–8.5)
eGFR: 101 mL/min/{1.73_m2} (ref >59)

## 2021-01-10 LAB — HEMOGLOBIN A1C
Est. average glucose Bld gHb Est-mCnc: 134 mg/dL
Hgb A1c MFr Bld: 6.3 % — ABNORMAL HIGH (ref 4.8–5.6)

## 2021-01-10 LAB — TSH+FREE T4
Free T4: 1.72 ng/dL (ref 0.82–1.77)
TSH: 0.172 u[IU]/mL — ABNORMAL LOW (ref 0.450–4.500)

## 2021-01-14 ENCOUNTER — Other Ambulatory Visit: Payer: Self-pay

## 2021-01-14 MED ORDER — MONTELUKAST SODIUM 10 MG PO TABS: ORAL_TABLET | ORAL | 1 refills | Status: DC

## 2021-01-14 MED ORDER — DOXEPIN HCL 100 MG PO CAPS: 100.0000 mg | ORAL_CAPSULE | Freq: Every day | ORAL | 1 refills | Status: DC

## 2021-01-17 ENCOUNTER — Other Ambulatory Visit: Payer: Self-pay

## 2021-01-17 ENCOUNTER — Encounter: Payer: Self-pay | Admitting: Internal Medicine

## 2021-01-17 MED ORDER — MONTELUKAST SODIUM 10 MG PO TABS: ORAL_TABLET | ORAL | 1 refills | Status: DC

## 2021-01-17 MED ORDER — ESOMEPRAZOLE MAGNESIUM 40 MG PO CPDR: 40.0000 mg | DELAYED_RELEASE_CAPSULE | Freq: Every day | ORAL | 1 refills | Status: DC

## 2021-01-17 MED ORDER — DOXEPIN HCL 100 MG PO CAPS: 100.0000 mg | ORAL_CAPSULE | Freq: Every day | ORAL | 1 refills | Status: DC

## 2021-01-18 ENCOUNTER — Other Ambulatory Visit: Payer: Self-pay | Admitting: Obstetrics & Gynecology

## 2021-01-18 DIAGNOSIS — Z1509 Genetic susceptibility to other malignant neoplasm: Secondary | ICD-10-CM

## 2021-01-18 DIAGNOSIS — N950 Postmenopausal bleeding: Secondary | ICD-10-CM

## 2021-01-18 DIAGNOSIS — Z1501 Genetic susceptibility to malignant neoplasm of breast: Secondary | ICD-10-CM

## 2021-01-18 DIAGNOSIS — Z298 Encounter for other specified prophylactic measures: Secondary | ICD-10-CM

## 2021-01-18 MED ORDER — TAMOXIFEN CITRATE 20 MG PO TABS: 20.0000 mg | ORAL_TABLET | Freq: Every day | ORAL | 3 refills | Status: DC

## 2021-02-21 ENCOUNTER — Ambulatory Visit: Payer: Self-pay | Admitting: Obstetrics and Gynecology

## 2021-03-22 ENCOUNTER — Other Ambulatory Visit: Payer: Self-pay | Admitting: Internal Medicine

## 2021-03-22 DIAGNOSIS — M62838 Other muscle spasm: Secondary | ICD-10-CM

## 2021-03-22 NOTE — Telephone Encounter (Signed)
Requested medication (s) are due for refill today: yes  Requested medication (s) are on the active medication list: yes  Last refill:  12/20/20 #270  Future visit scheduled: yes  Notes to clinic:  Please review for refill. Refill not delegated at this time.     Requested Prescriptions  Pending Prescriptions Disp Refills   cyclobenzaprine (FLEXERIL) 10 MG tablet [Pharmacy Med Name: CYCLOBENZAPRINE 10MG  TABLETS] 270 tablet 0    Sig: TAKE 1 AND 1/2 TABLETS(15 MG) BY MOUTH IN THE MORNING AND AT BEDTIME     Not Delegated - Analgesics:  Muscle Relaxants Failed - 03/22/2021 11:59 AM      Failed - This refill cannot be delegated      Passed - Valid encounter within last 6 months    Recent Outpatient Visits           2 months ago Essential (primary) hypertension   [facility] Glean Hess, MD   10 months ago Essential (primary) hypertension   [facility] Glean Hess, MD   11 months ago Encounter for medication review   [place], [name]   1 year ago Annual physical exam   [facility] Glean Hess, MD   1 year ago Essential (primary) hypertension   [facility] Glean Hess, MD       Future Appointments             In 1 month Army Melia, Jesse Sans, MD [facility], [facility]

## 2021-03-25 ENCOUNTER — Other Ambulatory Visit: Payer: Self-pay | Admitting: Internal Medicine

## 2021-03-25 ENCOUNTER — Telehealth: Payer: Self-pay

## 2021-03-25 DIAGNOSIS — M62838 Other muscle spasm: Secondary | ICD-10-CM

## 2021-03-25 DIAGNOSIS — M797 Fibromyalgia: Secondary | ICD-10-CM

## 2021-03-25 NOTE — Telephone Encounter (Signed)
Copied from [place] [id number]. 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 Please be advised

## 2021-03-25 NOTE — Telephone Encounter (Signed)
Copied from [place] [id number]. Topic: Quick Communication - Rx Refill/Question >> Mar 25, 2021 12:03 PM Leward Quan A wrote: Medication: traMADol (ULTRAM) 50 MG tablet, cyclobenzaprine (FLEXERIL) 10 MG tablet  Per patient medication was denied.  Has the patient contacted their pharmacy? Yes.  Rx was denied by office  (Agent: If no, request that the patient contact the pharmacy for the refill.) (Agent: If yes, when and what did the pharmacy advise?)  Preferred Pharmacy (with phone number or street name): [place], [address] AT [address] [name]  Phone:  [phone] Fax:  [phone]    Has the patient been seen for an appointment in the last year OR does the patient have an upcoming appointment? Yes.    Agent: Please be advised that RX refills may take up to 3 business days. We ask that you follow-up with your pharmacy.

## 2021-03-26 NOTE — Telephone Encounter (Signed)
Patient message about refills.

## 2021-03-26 NOTE — Telephone Encounter (Signed)
Requested medication (s) are due for refill today:   Provider to review for both  Requested medication (s) are on the active medication list:   Yes for both  Future visit scheduled:   Yes   Last ordered: Ultram 12/11/2020 - 06/09/2021 #60, 0 refills;   Flexeril 12/20/2020 - 12/20/2021 #270,  0 refills  Returned because both non delegated refills   Requested Prescriptions  Pending Prescriptions Disp Refills   traMADol (ULTRAM) 50 MG tablet 60 tablet 0    Sig: TAKE 1 TABLET BY MOUTH EVERY 12 HOURS AS NEEDED     Not Delegated - Analgesics:  Opioid Agonists Failed - 03/25/2021  4:31 PM      Failed - This refill cannot be delegated      Failed - Urine Drug Screen completed in last 360 days      Passed - Valid encounter within last 6 months    Recent Outpatient Visits           2 months ago Essential (primary) hypertension   [facility] Glean Hess, MD   10 months ago Essential (primary) hypertension   [facility] Glean Hess, MD   11 months ago Encounter for medication review   Rankin, [name]   1 year ago Annual physical exam   [facility] Glean Hess, MD   1 year ago Essential (primary) hypertension   [facility] Glean Hess, MD       Future Appointments             In 1 month Glean Hess, MD [facility]             cyclobenzaprine (FLEXERIL) 10 MG tablet 270 tablet 0    Sig: TAKE 1 & 1/2 TABLETS (15 MG) BY MOUTH IN THE MORNING AND AT BEDTIME.     Not Delegated - Analgesics:  Muscle Relaxants Failed - 03/25/2021  4:31 PM      Failed - This refill cannot be delegated      Passed - Valid encounter within last 6 months    Recent Outpatient Visits           2 months ago Essential (primary) hypertension   [facility] Glean Hess, MD   10 months ago Essential (primary) hypertension   [facility]  Glean Hess, MD   11 months ago Encounter for medication review   Martin, [name]   1 year ago Annual physical exam   [facility] Glean Hess, MD   1 year ago Essential (primary) hypertension   [facility] Glean Hess, MD       Future Appointments             In 1 month Army Melia Jesse Sans, MD [facility], [facility]

## 2021-03-27 MED ORDER — CYCLOBENZAPRINE HCL 10 MG PO TABS: ORAL_TABLET | ORAL | 0 refills | Status: DC

## 2021-03-27 MED ORDER — TRAMADOL HCL 50 MG PO TABS
ORAL_TABLET | Freq: Two times a day (BID) | ORAL | 0 refills | Status: DC | PRN
Start: 2021-03-27 — End: 2021-05-20

## 2021-04-07 ENCOUNTER — Encounter: Payer: Self-pay | Admitting: Internal Medicine

## 2021-04-07 ENCOUNTER — Other Ambulatory Visit: Payer: Self-pay | Admitting: Internal Medicine

## 2021-04-07 MED ORDER — TRAZODONE HCL 50 MG PO TABS: 100.0000 mg | ORAL_TABLET | Freq: Every day | ORAL | 5 refills | Status: DC

## 2021-05-14 ENCOUNTER — Ambulatory Visit: Payer: Commercial Managed Care - PPO | Admitting: Internal Medicine

## 2021-05-20 ENCOUNTER — Ambulatory Visit (INDEPENDENT_AMBULATORY_CARE_PROVIDER_SITE_OTHER): Payer: Medicare Other | Admitting: Internal Medicine

## 2021-05-20 ENCOUNTER — Encounter: Payer: Self-pay | Admitting: Internal Medicine

## 2021-05-20 ENCOUNTER — Other Ambulatory Visit: Payer: Self-pay

## 2021-05-20 VITALS — BP 140/80 | HR 78 | Ht 61.0 in | Wt 229.0 lb

## 2021-05-20 DIAGNOSIS — Z23 Encounter for immunization: Secondary | ICD-10-CM | POA: Diagnosis not present

## 2021-05-20 DIAGNOSIS — I10 Essential (primary) hypertension: Secondary | ICD-10-CM | POA: Diagnosis not present

## 2021-05-20 DIAGNOSIS — M797 Fibromyalgia: Secondary | ICD-10-CM | POA: Diagnosis not present

## 2021-05-20 DIAGNOSIS — E782 Mixed hyperlipidemia: Secondary | ICD-10-CM

## 2021-05-20 DIAGNOSIS — R7303 Prediabetes: Secondary | ICD-10-CM

## 2021-05-20 MED ORDER — TRAMADOL HCL 50 MG PO TABS: 50.0000 mg | ORAL_TABLET | Freq: Two times a day (BID) | ORAL | 3 refills | Status: DC | PRN

## 2021-05-20 NOTE — Progress Notes (Signed)
Date:  05/20/2021   Name:  [name]   DOB:  1960/09/08   MRN:  [id number]   Chief Complaint: Hypertension, Prediabetes, and Hypothyroidism  Hypertension This is a chronic problem. The problem is controlled. Pertinent negatives include no chest pain, headaches, palpitations or shortness of breath. Past treatments include ACE inhibitors and beta blockers. The current treatment provides significant improvement. Identifiable causes of hypertension include a thyroid problem.  Diabetes She presents for her follow-up diabetic visit. Diabetes type: prediabetes. Her disease course has been stable. Pertinent negatives for hypoglycemia include no dizziness, headaches or nervousness/anxiousness. Pertinent negatives for diabetes include no chest pain, no fatigue and no weakness. Current diabetic treatment includes diet. She is compliant with treatment most of the time.  Thyroid Problem Presents for follow-up visit. Patient reports no anxiety, constipation, diarrhea, fatigue or palpitations. The symptoms have been stable.  Fibromyalgia - symptoms unchanged, unable to exercise and becoming more SOB with exertion.  Continues on Tramadol bid for severe pain.  Some of the SOB and pain may be secondary to PA.  Lab Results  Component Value Date   NA 141 01/09/2021   K 4.6 01/09/2021   CO2 25 01/09/2021   GLUCOSE 112 (H) 01/09/2021   BUN 13 01/09/2021   CREATININE 0.62 01/09/2021   CALCIUM 9.5 01/09/2021   EGFR 101 01/09/2021   GFRNONAA 104 11/21/2019   Lab Results  Component Value Date   CHOL 200 (H) 01/09/2021   HDL 52 01/09/2021   LDLCALC 108 (H) 01/09/2021   TRIG 231 (H) 01/09/2021   CHOLHDL 3.8 01/09/2021   Lab Results  Component Value Date   TSH 0.172 (L) 01/09/2021   Lab Results  Component Value Date   HGBA1C 6.3 (H) 01/09/2021   Lab Results  Component Value Date   WBC 10.6 01/09/2021   HGB 13.8 01/09/2021   HCT 41.9 01/09/2021   MCV 92 01/09/2021   PLT 338 01/09/2021    Lab Results  Component Value Date   ALT 14 01/09/2021   AST 21 01/09/2021   ALKPHOS 95 01/09/2021   BILITOT 0.3 01/09/2021   No results found for: 25OHVITD2, 25OHVITD3, VD25OH   Review of Systems  Constitutional:  Negative for fatigue and unexpected weight change.  HENT:  Negative for nosebleeds.   Eyes:  Negative for visual disturbance.  Respiratory:  Negative for cough, chest tightness, shortness of breath and wheezing.   Cardiovascular:  Negative for chest pain, palpitations and leg swelling.  Gastrointestinal:  Negative for abdominal pain, constipation and diarrhea.  Musculoskeletal:  Positive for arthralgias and myalgias.  Neurological:  Negative for dizziness, weakness, light-headedness and headaches.  Psychiatric/Behavioral:  Positive for sleep disturbance. Negative for dysphoric mood. The patient is not nervous/anxious.    Patient Active Problem List   Diagnosis Date Noted   S/P hysterectomy with oophorectomy 05/19/2019   BRCA2 gene mutation positive 03/03/2019   Pre-diabetes 09/16/2016   Insomnia 09/16/2016   Arthritis of carpometacarpal (CMC) joint of thumb 05/29/2016   Venous insufficiency of both lower extremities 01/17/2016   Acquired hypothyroidism 11/10/2014   Essential (primary) hypertension 11/10/2014   Fibromyalgia syndrome 11/10/2014   Mixed hyperlipidemia 11/10/2014   Irritable bowel syndrome with diarrhea 11/10/2014   Asthma, mild intermittent 11/10/2014   BMI 40.0-44.9, adult (HCC) 11/10/2014   Chronic peptic ulcer 11/10/2014   Psoriasis 11/10/2014   Obstructive apnea 11/10/2014   Chest pain 07/19/2012   Environmental and seasonal allergies 09/08/2011    Allergies  Allergen Reactions  Sulfa Antibiotics Other (See Comments)    seizure   Amlodipine Swelling    Leg swelling   Furosemide Swelling   Lyrica [Pregabalin] Other (See Comments)    Dizziness and blurred vision   Hydrochlorothiazide Itching and Rash    Past Surgical History:   Procedure Laterality Date   APPENDECTOMY     BREAST BIOPSY Left 2007   benign, dr Bary Castilla   CHOLECYSTECTOMY     CYSTOSCOPY  05/19/2019   Procedure: CYSTOSCOPY;  Surgeon: Homero Fellers, MD;  Location: [facility] OR[facility];  Service: Gynecology;;   HYSTEROSCOPY WITH D & C N/A 12/14/2018   Procedure: DILATATION AND CURETTAGE /HYSTEROSCOPY WITH MYOSURE, VULVAR BIOPSY;  Surgeon: Homero Fellers, MD;  Location: [facility] OR[facility];  Service: Gynecology;  Laterality: N/A;   SKIN BIOPSY Left    Back on L leg    SKIN CANCER EXCISION     BCCA of face   TONSILLECTOMY AND ADENOIDECTOMY     TOTAL LAPAROSCOPIC HYSTERECTOMY WITH SALPINGECTOMY N/A 05/19/2019   Procedure: TOTAL LAPAROSCOPIC HYSTERECTOMY WITH SALPINGECTOMY/ TLH/BSO;  Surgeon: Homero Fellers, MD;  Location: [facility] OR[facility];  Service: Gynecology;  Laterality: N/A;    Social History   Tobacco Use   Smoking status: Never   Smokeless tobacco: Never  Vaping Use   Vaping Use: Never used  Substance Use Topics   Alcohol use: Yes    Alcohol/week: 2.0 standard drinks    Types: 2 Standard drinks or equivalent per week    Comment: rarely   Drug use: No     Medication list has been reviewed and updated.  Current Meds  Medication Sig   albuterol (VENTOLIN HFA) 108 (90 Base) MCG/ACT inhaler Inhale 2 puffs into the lungs 4 (four) times daily as needed.   aspirin 81 MG EC tablet Take 81 mg by mouth at bedtime.    blood glucose meter kit and supplies KIT Dispense based on patient and insurance preference. Use up to four times daily as directed. (FOR ICD-9 250.00, 250.01).   cetirizine (ZYRTEC) 10 MG tablet Take 10 mg by mouth every morning.    cyclobenzaprine (FLEXERIL) 10 MG tablet TAKE 1 & 1/2 TABLETS (15 MG) BY MOUTH IN THE MORNING AND AT BEDTIME. (Patient taking differently: Take 10 mg by mouth 3 (three) times daily.)   diphenhydrAMINE (BENADRYL) 25 MG tablet Take 25-50 mg by mouth every 6 (six) hours as needed (allergies/asthma).   doxazosin  (CARDURA) 2 MG tablet Take 1 tablet (2 mg total) by mouth daily.   doxepin (SINEQUAN) 100 MG capsule Take 1 capsule (100 mg total) by mouth at bedtime.   econazole nitrate 1 % cream Apply under breasts and in groin creases 1-2 times a day as needed.   esomeprazole (NEXIUM) 40 MG capsule Take 1 capsule (40 mg total) by mouth daily before supper. (1700)   Fluocinolone Acetonide 0.01 % OIL Apply 1-2 drops to ear canals 1-2 times daily as needed for psoriasis.   fluticasone (FLONASE) 50 MCG/ACT nasal spray Place 2 sprays into both nostrils daily.   FREESTYLE LITE test strip    lactobacillus acidophilus (BACID) TABS tablet Take 2 tablets by mouth 3 (three) times daily.   Lancets (FREESTYLE) lancets 1 each 4 (four) times daily.   levothyroxine (SYNTHROID) 200 MCG tablet Take 1 tablet (200 mcg total) by mouth daily.   lisinopril (ZESTRIL) 40 MG tablet TAKE 1 TABLET BY MOUTH DAILY   loperamide (IMODIUM A-D) 2 MG tablet Take 2-4 mg by mouth 4 (four) times  daily as needed for diarrhea or loose stools.    meloxicam (MOBIC) 15 MG tablet Take 1 tablet (15 mg total) by mouth daily.   montelukast (SINGULAIR) 10 MG tablet TAKE 1 TABLET BY MOUTH NIGHTLY AT BEDTIME   Multiple Vitamins-Minerals (MULTIVITAMIN WOMEN 50+ PO) Take by mouth.   Nebivolol HCl 20 MG TABS Take 1 tablet (20 mg total) by mouth daily.   OVER THE COUNTER MEDICATION Woman's multvitamin-Take 1 gummie daily.   psyllium (METAMUCIL) 58.6 % powder Take 1 packet by mouth 3 (three) times daily.   tamoxifen (NOLVADEX) 20 MG tablet Take 1 tablet (20 mg total) by mouth daily.   traZODone (DESYREL) 50 MG tablet Take 2 tablets (100 mg total) by mouth at bedtime.   vitamin B-12 (CYANOCOBALAMIN) 1000 MCG tablet Take 1500 mcg-2 gummie daily.   [DISCONTINUED] traMADol (ULTRAM) 50 MG tablet TAKE 1 TABLET BY MOUTH EVERY 12 HOURS AS NEEDED    PHQ 2/9 Scores 05/20/2021 01/09/2021 05/09/2020 11/21/2019  PHQ - 2 Score 0 1 0 2  PHQ- 9 Score $Remov'3 7 3 8    'zdReeE$ GAD 7 :  Generalized Anxiety Score 05/20/2021 01/09/2021 05/09/2020 11/21/2019  Nervous, Anxious, on Edge 0 0 0 0  Control/stop worrying 0 0 0 0  Worry too much - different things 0 0 0 0  Trouble relaxing 0 1 0 0  Restless 0 0 0 0  Easily annoyed or irritable 1 1 0 0  Afraid - awful might happen 0 0 0 0  Total GAD 7 Score 1 2 0 0  Anxiety Difficulty Not difficult at all Not difficult at all - Not difficult at all    BP Readings from Last 3 Encounters:  05/20/21 140/80  01/09/21 136/84  05/09/20 (!) 142/98    Physical Exam Vitals and nursing note reviewed.  Constitutional:      General: She is not in acute distress.    Appearance: Normal appearance. She is well-developed.  HENT:     Head: Normocephalic and atraumatic.  Cardiovascular:     Rate and Rhythm: Normal rate and regular rhythm.     Pulses: Normal pulses.     Heart sounds: No murmur heard. Pulmonary:     Effort: Pulmonary effort is normal. No respiratory distress.     Breath sounds: No wheezing or rhonchi.  Musculoskeletal:        General: Tenderness present.     Cervical back: Normal range of motion.     Right lower leg: No edema.     Left lower leg: No edema.  Lymphadenopathy:     Cervical: No cervical adenopathy.  Skin:    General: Skin is warm and dry.     Capillary Refill: Capillary refill takes less than 2 seconds.     Findings: No rash.  Neurological:     General: No focal deficit present.     Mental Status: She is alert and oriented to person, place, and time.  Psychiatric:        Mood and Affect: Mood normal.        Behavior: Behavior normal.    Wt Readings from Last 3 Encounters:  05/20/21 229 lb (103.9 kg)  01/09/21 221 lb (100.2 kg)  05/09/20 227 lb (103 kg)    BP 140/80 (BP Location: Right Arm, Patient Position: Sitting, Cuff Size: Large)   Pulse 78   Ht $R'5\' 1"'iL$  (1.549 m)   Wt 229 lb (103.9 kg)   SpO2 96%   BMI 43.27 kg/m  Assessment and Plan: 1. Essential (primary) hypertension Clinically  stable exam with well controlled BP. Tolerating medications without side effects at this time. Pt to continue current regimen and low sodium diet; benefits of regular exercise as able discussed. - Basic metabolic panel  2. Mixed hyperlipidemia Currently not on medication Continue to make efforts for low fat diet  3. Pre-diabetes Check labs and advise - Hemoglobin A1c  4. Fibromyalgia syndrome Continue Tramadol bid  Also has psoriatic arthritis - previously on Otezla but new medicare plan does not cover this - may need to see Rheumatology - traMADol (ULTRAM) 50 MG tablet; Take 1 tablet (50 mg total) by mouth every 12 (twelve) hours as needed.  Dispense: 60 tablet; Refill: 3  5. Need for immunization against influenza - Flu Vaccine QUAD 63mo+IM (Fluarix, Fluzone & Alfiuria Quad PF)   Partially dictated using Editor, commissioning. Any errors are unintentional.  Halina Maidens, MD [facility]  05/20/2021

## 2021-05-21 LAB — BASIC METABOLIC PANEL
BUN/Creatinine Ratio: 13 (ref 12–28)
BUN: 10 mg/dL (ref 8–27)
CO2: 27 mmol/L (ref 20–29)
Calcium: 9.4 mg/dL (ref 8.7–10.3)
Chloride: 105 mmol/L (ref 96–106)
Creatinine, Ser: 0.79 mg/dL (ref 0.57–1.00)
Glucose: 142 mg/dL — ABNORMAL HIGH (ref 70–99)
Potassium: 4.5 mmol/L (ref 3.5–5.2)
Sodium: 144 mmol/L (ref 134–144)
eGFR: 85 mL/min/{1.73_m2} (ref >59)

## 2021-05-21 LAB — HEMOGLOBIN A1C
Est. average glucose Bld gHb Est-mCnc: 131 mg/dL
Hgb A1c MFr Bld: 6.2 % — ABNORMAL HIGH (ref 4.8–5.6)

## 2021-06-14 ENCOUNTER — Other Ambulatory Visit: Payer: Self-pay | Admitting: Internal Medicine

## 2021-06-14 DIAGNOSIS — M62838 Other muscle spasm: Secondary | ICD-10-CM

## 2021-06-14 NOTE — Telephone Encounter (Signed)
Requested medication (s) are due for refill today: yes  Requested medication (s) are on the active medication list: yes  Last refill:  03/27/21 #270/0  Future visit scheduled: yes  Notes to clinic:  Unable to refill per protocol, cannot delegate.      Requested Prescriptions  Pending Prescriptions Disp Refills   cyclobenzaprine (FLEXERIL) 10 MG tablet [Pharmacy Med Name: CYCLOBENZAPRINE 10MG  TABLETS] 270 tablet 0    Sig: TAKE 1 AND 1/2 TABLETS(15 MG) BY MOUTH IN THE MORNING AND AT BEDTIME     Not Delegated - Analgesics:  Muscle Relaxants Failed - 06/14/2021  2:25 PM      Failed - This refill cannot be delegated      Passed - Valid encounter within last 6 months    Recent Outpatient Visits           3 weeks ago Essential (primary) hypertension   [facility] Glean Hess, MD   5 months ago Essential (primary) hypertension   [facility] Glean Hess, MD   1 year ago Essential (primary) hypertension   [facility] Glean Hess, MD   1 year ago Encounter for medication review   [place], [name]   1 year ago Annual physical exam   [facility] Glean Hess, MD       Future Appointments             In 2 months [place] Melia Jesse Sans, MD [facility], [facility]

## 2021-06-17 ENCOUNTER — Other Ambulatory Visit: Payer: Self-pay

## 2021-06-17 DIAGNOSIS — M62838 Other muscle spasm: Secondary | ICD-10-CM

## 2021-06-17 MED ORDER — CYCLOBENZAPRINE HCL 10 MG PO TABS: 10.0000 mg | ORAL_TABLET | Freq: Three times a day (TID) | ORAL | 0 refills | Status: DC

## 2021-06-17 MED ORDER — LISINOPRIL 40 MG PO TABS: 40.0000 mg | ORAL_TABLET | Freq: Every day | ORAL | 3 refills | Status: DC

## 2021-07-01 ENCOUNTER — Other Ambulatory Visit: Payer: Self-pay | Admitting: Internal Medicine

## 2021-07-01 DIAGNOSIS — I10 Essential (primary) hypertension: Secondary | ICD-10-CM

## 2021-07-01 DIAGNOSIS — E039 Hypothyroidism, unspecified: Secondary | ICD-10-CM

## 2021-07-01 DIAGNOSIS — M797 Fibromyalgia: Secondary | ICD-10-CM

## 2021-07-02 NOTE — Telephone Encounter (Signed)
Requested medications are due for refill today.  yes  Requested medications are on the active medications list.  yes  Last refill. 01/09/2021  Future visit scheduled.   yes  Notes to clinic.  Failed protocol d/t abnormal labs.    Requested Prescriptions  Pending Prescriptions Disp Refills   levothyroxine (SYNTHROID) 200 MCG tablet [Pharmacy Med Name: LEVOTHYROXINE 0.2MG  (200MCG) TAB] 30 tablet 5    Sig: TAKE 1 TABLET(200 MCG) BY MOUTH DAILY     Endocrinology:  Hypothyroid Agents Failed - 07/01/2021  4:48 PM      Failed - TSH needs to be rechecked within 3 months after an abnormal result. Refill until TSH is due.      Failed - TSH in normal range and within 360 days    TSH  Date Value Ref Range Status  01/09/2021 0.172 (L) 0.450 - 4.500 uIU/mL Final          Passed - Valid encounter within last 12 months    Recent Outpatient Visits           1 month ago Essential (primary) hypertension   [facility] Glean Hess, MD   5 months ago Essential (primary) hypertension   [facility] Glean Hess, MD   1 year ago Essential (primary) hypertension   [facility] Glean Hess, MD   1 year ago Encounter for medication review   [place], [name]   1 year ago Annual physical exam   [facility] Glean Hess, MD       Future Appointments             In 2 months Glean Hess, MD [facility]            Signed Prescriptions Disp Refills   doxazosin (CARDURA) 2 MG tablet 30 tablet 2    Sig: TAKE 1 TABLET(2 MG) BY MOUTH DAILY     Cardiovascular:  Alpha Blockers Failed - 07/01/2021  4:48 PM      Failed - Last BP in normal range    BP Readings from Last 1 Encounters:  05/20/21 140/80          Passed - Valid encounter within last 6 months    Recent Outpatient Visits           1 month ago Essential (primary) hypertension   [facility] Glean Hess, MD   5 months ago Essential (primary) hypertension   [facility] Glean Hess, MD   1 year ago Essential (primary) hypertension   [facility] Glean Hess, MD   1 year ago Encounter for medication review   [place], [name]   1 year ago Annual physical exam   [facility] Glean Hess, MD       Future Appointments             In 2 months Glean Hess, MD [facility], [facility]             meloxicam (MOBIC) 15 MG tablet 30 tablet 5    Sig: TAKE 1 TABLET(15 MG) BY MOUTH DAILY     Analgesics:  COX2 Inhibitors Passed - 07/01/2021  4:48 PM      Passed - HGB in normal range and within 360 days    Hemoglobin  Date Value Ref Range Status  01/09/2021 13.8 11.1 - 15.9 g/dL Final          Passed - Cr in normal range and within 360 days    Creatinine  Date Value Ref Range Status  08/21/2013 0.66 0.60 - 1.30 mg/dL Final   Creatinine, Ser  Date Value Ref Range Status  05/20/2021 0.79 0.57 - 1.00 mg/dL Final          Passed - Patient is not pregnant      Passed - Valid encounter within last 12 months    Recent Outpatient Visits           1 month ago Essential (primary) hypertension   [facility]   5 months ago Essential (primary) hypertension   [facility] Glean Hess, MD   1 year ago Essential (primary) hypertension   [facility] Glean Hess, MD   1 year ago Encounter for medication review   [place], [name]   1 year ago Annual physical exam   [facility] Glean Hess, MD       Future Appointments             In 2 months Glean Hess, MD [facility], [facility]             Nebivolol HCl 20 MG TABS 30 tablet 2    Sig: TAKE 1 TABLET(20 MG) BY MOUTH DAILY     Cardiovascular:  Beta  Blockers Failed - 07/01/2021  4:48 PM      Failed - Last BP in normal range    BP Readings from Last 1 Encounters:  05/20/21 140/80          Passed - Last Heart Rate in normal range    Pulse Readings from Last 1 Encounters:  05/20/21 78          Passed - Valid encounter within last 6 months    Recent Outpatient Visits           1 month ago Essential (primary) hypertension   [facility] Glean Hess, MD   5 months ago Essential (primary) hypertension   [facility] Glean Hess, MD   1 year ago Essential (primary) hypertension   [facility] Glean Hess, MD   1 year ago Encounter for medication review   [place], [name]   1 year ago Annual physical exam   [facility] Glean Hess, MD       Future Appointments             In 2 months Army Melia Jesse Sans, MD [facility], [facility]

## 2021-07-02 NOTE — Telephone Encounter (Signed)
Requested Prescriptions  Pending Prescriptions Disp Refills   doxazosin (CARDURA) 2 MG tablet [Pharmacy Med Name: DOXAZOSIN 2MG  TABLETS] 30 tablet 5    Sig: TAKE 1 TABLET(2 MG) BY MOUTH DAILY     Cardiovascular:  Alpha Blockers Failed - 07/01/2021  4:48 PM      Failed - Last BP in normal range    BP Readings from Last 1 Encounters:  05/20/21 140/80         Passed - Valid encounter within last 6 months    Recent Outpatient Visits          1 month ago Essential (primary) hypertension   [facility] Glean Hess, MD   5 months ago Essential (primary) hypertension   [facility] Glean Hess, MD   1 year ago Essential (primary) hypertension   [facility] Glean Hess, MD   1 year ago Encounter for medication review   [place], [name]   1 year ago Annual physical exam   [facility] Glean Hess, MD      Future Appointments            In 2 months Army Melia Jesse Sans, MD [facility], [facility]            meloxicam ([place]) 15 MG tablet [Pharmacy Med Name: MELOXICAM 15MG  TABLETS] 30 tablet 5    Sig: TAKE 1 TABLET(15 MG) BY MOUTH DAILY     Analgesics:  COX2 Inhibitors Passed - 07/01/2021  4:48 PM      Passed - HGB in normal range and within 360 days    Hemoglobin  Date Value Ref Range Status  01/09/2021 13.8 11.1 - 15.9 g/dL Final         Passed - Cr in normal range and within 360 days    Creatinine  Date Value Ref Range Status  08/21/2013 0.66 0.60 - 1.30 mg/dL Final   Creatinine, Ser  Date Value Ref Range Status  05/20/2021 0.79 0.57 - 1.00 mg/dL Final         Passed - Patient is not pregnant      Passed - Valid encounter within last 12 months    Recent Outpatient Visits          1 month ago Essential (primary) hypertension   [facility] Glean Hess, MD   5 months ago Essential (primary) hypertension   [facility]  Glean Hess, MD   1 year ago Essential (primary) hypertension   [facility] Glean Hess, MD   1 year ago Encounter for medication review   [place], [name]   1 year ago Annual physical exam   [facility] Glean Hess, MD      Future Appointments            In 2 months Glean Hess, MD [facility], [facility]            Nebivolol HCl 20 MG TABS [Pharmacy Med Name: NEBIVOLOL 20MG  TABLETS] 30 tablet 5    Sig: TAKE 1 TABLET(20 MG) BY MOUTH DAILY     Cardiovascular:  Beta Blockers Failed - 07/01/2021  4:48 PM      Failed - Last BP in normal range    BP Readings from Last 1 Encounters:  05/20/21 140/80         Passed -  Last Heart Rate in normal range    Pulse Readings from Last 1 Encounters:  05/20/21 78         Passed - Valid encounter within last 6 months    Recent Outpatient Visits          1 month ago Essential (primary) hypertension   [facility] Glean Hess, MD   5 months ago Essential (primary) hypertension   [facility] Glean Hess, MD   1 year ago Essential (primary) hypertension   [facility] Glean Hess, MD   1 year ago Encounter for medication review   [place], [name]   1 year ago Annual physical exam   [facility] Glean Hess, MD      Future Appointments            In 2 months Army Melia Jesse Sans, MD [facility], [facility]            levothyroxine (SYNTHROID) 200 MCG tablet [Pharmacy Med Name: LEVOTHYROXINE 0.2MG  (200MCG) TAB] 30 tablet 5    Sig: TAKE 1 TABLET(200 MCG) BY MOUTH DAILY     Endocrinology:  Hypothyroid Agents Failed - 07/01/2021  4:48 PM      Failed - TSH needs to be rechecked within 3 months after an abnormal result. Refill until TSH is due.      Failed - TSH in normal range and within 360 days    TSH  Date  Value Ref Range Status  01/09/2021 0.172 (L) 0.450 - 4.500 uIU/mL Final         Passed - Valid encounter within last 12 months    Recent Outpatient Visits          1 month ago Essential (primary) hypertension   [place], MD   5 months ago Essential (primary) hypertension   [facility] Glean Hess, MD   1 year ago Essential (primary) hypertension   [facility] Glean Hess, MD   1 year ago Encounter for medication review   [place], [name]   1 year ago Annual physical exam   [facility] Glean Hess, MD      Future Appointments            In 2 months Army Melia Jesse Sans, MD [facility], [place] [facility]

## 2021-09-09 ENCOUNTER — Ambulatory Visit (INDEPENDENT_AMBULATORY_CARE_PROVIDER_SITE_OTHER): Payer: Medicare Other | Admitting: Internal Medicine

## 2021-09-09 ENCOUNTER — Encounter: Payer: Self-pay | Admitting: Internal Medicine

## 2021-09-09 VITALS — BP 154/88 | HR 71 | Ht 61.0 in | Wt 236.0 lb

## 2021-09-09 DIAGNOSIS — M797 Fibromyalgia: Secondary | ICD-10-CM | POA: Diagnosis not present

## 2021-09-09 DIAGNOSIS — I10 Essential (primary) hypertension: Secondary | ICD-10-CM | POA: Diagnosis not present

## 2021-09-09 DIAGNOSIS — Z6841 Body Mass Index (BMI) 40.0 and over, adult: Secondary | ICD-10-CM | POA: Diagnosis not present

## 2021-09-09 DIAGNOSIS — J4520 Mild intermittent asthma, uncomplicated: Secondary | ICD-10-CM | POA: Diagnosis not present

## 2021-09-09 DIAGNOSIS — G4733 Obstructive sleep apnea (adult) (pediatric): Secondary | ICD-10-CM

## 2021-09-09 DIAGNOSIS — K277 Chronic peptic ulcer, site unspecified, without hemorrhage or perforation: Secondary | ICD-10-CM

## 2021-09-09 MED ORDER — ESOMEPRAZOLE MAGNESIUM 40 MG PO CPDR: 40.0000 mg | DELAYED_RELEASE_CAPSULE | Freq: Every day | ORAL | 1 refills | Status: DC

## 2021-09-09 MED ORDER — TRAZODONE HCL 50 MG PO TABS: 100.0000 mg | ORAL_TABLET | Freq: Every day | ORAL | 5 refills | Status: DC

## 2021-09-09 MED ORDER — TRAMADOL HCL 50 MG PO TABS: 50.0000 mg | ORAL_TABLET | Freq: Two times a day (BID) | ORAL | 3 refills | Status: DC | PRN

## 2021-09-09 MED ORDER — ALBUTEROL SULFATE HFA 108 (90 BASE) MCG/ACT IN AERS: 2.0000 | INHALATION_SPRAY | Freq: Four times a day (QID) | RESPIRATORY_TRACT | 5 refills | Status: AC | PRN

## 2021-09-09 NOTE — Progress Notes (Signed)
? ? ?Date:  09/09/2021  ? ?Name:  [name]   DOB:  04-Jun-1961   MRN:  [id number] ? ? ?Chief Complaint: Hypertension ? ?Hypertension ?This is a chronic problem. The current episode started more than 1 year ago. The problem is unchanged. The problem is uncontrolled. Associated symptoms include headaches and neck pain. Pertinent negatives include no chest pain or palpitations. (Right sided neck pain)  ?Neck Pain  ?This is a new problem. The current episode started 1 to 4 weeks ago. The problem occurs constantly. The problem has been gradually worsening. The pain is associated with lifting a heavy object. The pain is present in the right side. The quality of the pain is described as shooting, stabbing, aching and cramping. The pain is moderate. Worse during: radiating down into shoulder and right arm. Numbness and Tingling. Associated symptoms include headaches. Pertinent negatives include no chest pain, fever or trouble swallowing.  ?Gastroesophageal Reflux ?She complains of coughing, heartburn and wheezing. She reports no abdominal pain, no chest pain or no nausea. This is a recurrent problem. The problem occurs occasionally. Associated symptoms include fatigue. She has tried a PPI for the symptoms.  ?Asthma ?She complains of cough and wheezing. This is a recurrent problem. The problem occurs intermittently. Associated symptoms include headaches, heartburn and myalgias. Pertinent negatives include no chest pain, fever or trouble swallowing. Her symptoms are alleviated by beta-agonist. Her past medical history is significant for asthma.  ? ?Lab Results  ?Component Value Date  ? NA 144 05/20/2021  ? K 4.5 05/20/2021  ? CO2 27 05/20/2021  ? GLUCOSE 142 (H) 05/20/2021  ? BUN 10 05/20/2021  ? CREATININE 0.79 05/20/2021  ? CALCIUM 9.4 05/20/2021  ? EGFR 85 05/20/2021  ? GFRNONAA 104 11/21/2019  ? ?Lab Results  ?Component Value Date  ? CHOL 200 (H) 01/09/2021  ? HDL 52 01/09/2021  ? LDLCALC 108 (H) 01/09/2021  ? TRIG  231 (H) 01/09/2021  ? CHOLHDL 3.8 01/09/2021  ? ?Lab Results  ?Component Value Date  ? TSH 0.172 (L) 01/09/2021  ? ?Lab Results  ?Component Value Date  ? HGBA1C 6.2 (H) 05/20/2021  ? ?Lab Results  ?Component Value Date  ? WBC 10.6 01/09/2021  ? HGB 13.8 01/09/2021  ? HCT 41.9 01/09/2021  ? MCV 92 01/09/2021  ? PLT 338 01/09/2021  ? ?Lab Results  ?Component Value Date  ? ALT 14 01/09/2021  ? AST 21 01/09/2021  ? ALKPHOS 95 01/09/2021  ? BILITOT 0.3 01/09/2021  ? ?No results found for: 25OHVITD2, [place], VD25OH  ? ?Review of Systems  ?Constitutional:  Positive for fatigue. Negative for chills, fever and unexpected weight change.  ?HENT:  Negative for trouble swallowing.   ?Eyes:  Negative for visual disturbance.  ?Respiratory:  Positive for cough and wheezing. Negative for chest tightness.   ?Cardiovascular:  Negative for chest pain, palpitations and leg swelling.  ?Gastrointestinal:  Positive for heartburn. Negative for abdominal distention, abdominal pain and nausea.  ?Musculoskeletal:  Positive for arthralgias, myalgias and neck pain.  ?Neurological:  Positive for headaches. Negative for dizziness.  ?Psychiatric/Behavioral:  Negative for dysphoric mood and sleep disturbance. The patient is not nervous/anxious.   ? ?Patient Active Problem List  ? Diagnosis Date Noted  ? S/P hysterectomy with oophorectomy 05/19/2019  ? BRCA2 gene mutation positive 03/03/2019  ? Pre-diabetes 09/16/2016  ? Insomnia 09/16/2016  ? Arthritis of carpometacarpal [facility]) joint of thumb 05/29/2016  ? Venous insufficiency of both lower extremities 01/17/2016  ? Acquired hypothyroidism  11/10/2014  ? Essential (primary) hypertension 11/10/2014  ? Fibromyalgia syndrome 11/10/2014  ? Mixed hyperlipidemia 11/10/2014  ? Irritable bowel syndrome with diarrhea 11/10/2014  ? Asthma, mild intermittent 11/10/2014  ? BMI 40.0-44.9, adult ([place]) 11/10/2014  ? Chronic peptic ulcer 11/10/2014  ? Psoriasis 11/10/2014  ? Obstructive apnea 11/10/2014  ? Chest  pain 07/19/2012  ? Environmental and seasonal allergies 09/08/2011  ? ? ?Allergies  ?Allergen Reactions  ? Sulfa Antibiotics Other (See Comments)  ?  seizure  ? Amlodipine Swelling  ?  Leg swelling  ? Furosemide Swelling  ? Lyrica [Pregabalin] Other (See Comments)  ?  Dizziness and blurred vision  ? Hydrochlorothiazide Itching and Rash  ? ? ?Past Surgical History:  ?Procedure Laterality Date  ? APPENDECTOMY    ? BREAST BIOPSY Left 2007  ? benign, dr Bary Castilla  ? CHOLECYSTECTOMY    ? CYSTOSCOPY  05/19/2019  ? Procedure: CYSTOSCOPY;  Surgeon: Homero Fellers, MD;  Location: [facility] ORS;  Service: Gynecology;;  ? HYSTEROSCOPY WITH D & C N/A 12/14/2018  ? Procedure: DILATATION AND CURETTAGE /HYSTEROSCOPY WITH MYOSURE, VULVAR BIOPSY;  Surgeon: Homero Fellers, MD;  Location: [facility] ORS;  Service: Gynecology;  Laterality: N/A;  ? SKIN BIOPSY Left   ? Back on L leg   ? SKIN CANCER EXCISION    ? BCCA of face  ? TONSILLECTOMY AND ADENOIDECTOMY    ? TOTAL LAPAROSCOPIC HYSTERECTOMY WITH SALPINGECTOMY N/A 05/19/2019  ? Procedure: TOTAL LAPAROSCOPIC HYSTERECTOMY WITH SALPINGECTOMY/ TLH/BSO;  Surgeon: Homero Fellers, MD;  Location: [facility] ORS;  Service: Gynecology;  Laterality: N/A;  ? ? ?Social History  ? ?Tobacco Use  ? Smoking status: Never  ? Smokeless tobacco: Never  ?Vaping Use  ? Vaping Use: Never used  ?Substance Use Topics  ? Alcohol use: Yes  ?  Alcohol/week: 2.0 standard drinks  ?  Types: 2 Standard drinks or equivalent per week  ?  Comment: rarely  ? Drug use: No  ? ? ? ?Medication list has been reviewed and updated. ? ?Current Meds  ?Medication Sig  ? albuterol (VENTOLIN HFA) 108 (90 Base) MCG/ACT inhaler Inhale 2 puffs into the lungs 4 (four) times daily as needed.  ? aspirin 81 MG EC tablet Take 81 mg by mouth at bedtime.   ? blood glucose meter kit and supplies KIT Dispense based on patient and insurance preference. Use up to four times daily as directed. (FOR ICD-9 250.00, 250.01).  ? cetirizine (ZYRTEC)  10 MG tablet Take 10 mg by mouth every morning.   ? cyclobenzaprine (FLEXERIL) 10 MG tablet Take 1 tablet (10 mg total) by mouth 3 (three) times daily.  ? diphenhydrAMINE (BENADRYL) 25 MG tablet Take 25-50 mg by mouth every 6 (six) hours as needed (allergies/asthma).  ? doxazosin (CARDURA) 2 MG tablet TAKE 1 TABLET(2 MG) BY MOUTH DAILY  ? doxepin (SINEQUAN) 100 MG capsule Take 1 capsule (100 mg total) by mouth at bedtime.  ? econazole nitrate 1 % cream Apply under breasts and in groin creases 1-2 times a day as needed.  ? esomeprazole (NEXIUM) 40 MG capsule Take 1 capsule (40 mg total) by mouth daily before supper. (1700)  ? FIBER, GUAR GUM, PO Take by mouth.  ? Fluocinolone Acetonide 0.01 % OIL Apply 1-2 drops to ear canals 1-2 times daily as needed for psoriasis.  ? fluticasone (FLONASE) 50 MCG/ACT nasal spray Place 2 sprays into both nostrils daily.  ? FREESTYLE LITE test strip   ? lactobacillus acidophilus (BACID) TABS  tablet Take 2 tablets by mouth daily.  ? Lancets (FREESTYLE) lancets 1 each 4 (four) times daily.  ? levothyroxine (SYNTHROID) 200 MCG tablet TAKE 1 TABLET(200 MCG) BY MOUTH DAILY  ? lisinopril (ZESTRIL) 40 MG tablet Take 1 tablet (40 mg total) by mouth daily.  ? loperamide (IMODIUM A-D) 2 MG tablet Take 2-4 mg by mouth 4 (four) times daily as needed for diarrhea or loose stools.   ? meloxicam (MOBIC) 15 MG tablet TAKE 1 TABLET(15 MG) BY MOUTH DAILY  ? montelukast (SINGULAIR) 10 MG tablet TAKE 1 TABLET BY MOUTH NIGHTLY AT BEDTIME  ? Multiple Vitamins-Minerals (MULTIVITAMIN WOMEN 50+ PO) Take by mouth.  ? Nebivolol HCl 20 MG TABS TAKE 1 TABLET(20 MG) BY MOUTH DAILY  ? OVER THE COUNTER MEDICATION Woman's multvitamin-Take 1 gummie daily.  ? tamoxifen (NOLVADEX) 20 MG tablet Take 1 tablet (20 mg total) by mouth daily.  ? traMADol (ULTRAM) 50 MG tablet Take 1 tablet (50 mg total) by mouth every 12 (twelve) hours as needed.  ? traZODone (DESYREL) 50 MG tablet Take 2 tablets (100 mg total) by mouth at  bedtime.  ? vitamin B-12 (CYANOCOBALAMIN) 1000 MCG tablet Take 1500 mcg-2 gummie daily.  ? ? ? ?  09/09/2021  ?  1:27 PM 05/20/2021  ?  1:34 PM 01/09/2021  ? 10:09 AM 05/09/2020  ? 10:00 AM  ?GAD 7 : Generalize

## 2021-09-30 ENCOUNTER — Other Ambulatory Visit: Payer: Self-pay | Admitting: Internal Medicine

## 2021-09-30 DIAGNOSIS — M62838 Other muscle spasm: Secondary | ICD-10-CM

## 2021-10-01 NOTE — Telephone Encounter (Signed)
Requested medication (s) are due for refill today: Yes ? ?Requested medication (s) are on the active medication list: Yes ? ?Last refill:  06/17/21 ? ?Future visit scheduled: Yes ? ?Notes to clinic:  See request. ? ? ? ?Requested Prescriptions  ?Pending Prescriptions Disp Refills  ? cyclobenzaprine (FLEXERIL) 10 MG tablet [Pharmacy Med Name: CYCLOBENZAPRINE '10MG'$  TABLETS] 270 tablet 0  ?  Sig: TAKE 1 TABLET(10 MG) BY MOUTH THREE TIMES DAILY  ?  ? Not Delegated - Analgesics:  Muscle Relaxants Failed - 09/30/2021  9:45 AM  ?  ?  Failed - This refill cannot be delegated  ?  ?  Passed - Valid encounter within last 6 months  ?  Recent Outpatient Visits   ? ?      ? 3 weeks ago Essential (primary) hypertension  ? [facility] Glean [facility], [facility]  ? 4 months ago Essential (primary) hypertension  ? [facility] Glean [facility], MD  ? 8 months ago Essential (primary) hypertension  ? [facility] Glean [facility], [facility]  ? 1 year ago Essential (primary) hypertension  ? [facility] Glean Hess, MD  ? 1 year ago Encounter for medication review  ? [place], [name]  ? ?  ?  ?Future Appointments   ? ?        ? In 4 months Army Melia Jesse Sans, MD [facility], [place]  ? ?  ? ? ?  ?  ?  ? ?

## 2021-10-07 ENCOUNTER — Other Ambulatory Visit: Payer: Self-pay | Admitting: Internal Medicine

## 2021-10-07 DIAGNOSIS — I10 Essential (primary) hypertension: Secondary | ICD-10-CM

## 2021-10-27 IMAGING — MG MM DIGITAL SCREENING BILAT W/ TOMO AND CAD
8 series · 8 of 24 positions shown · non-contrast
Comparison: Previous exam(s).

ACR Breast Density Category a: The breast tissue is almost entirely
fatty.

CLINICAL DATA: Screening.

EXAM:
DIGITAL SCREENING BILATERAL MAMMOGRAM WITH TOMOSYNTHESIS AND CAD
TECHNIQUE: Bilateral screening digital craniocaudal and mediolateral oblique
mammograms were obtained. Bilateral screening digital breast
tomosynthesis was performed. The images were evaluated with
computer-aided detection.

[L CC synth-2D]
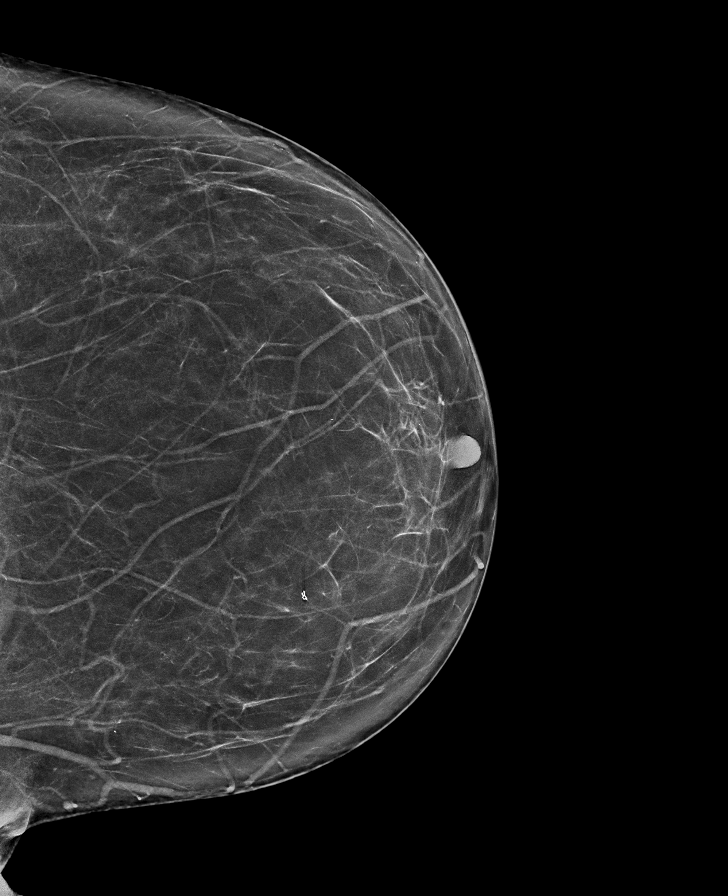

[R CC synth-2D]
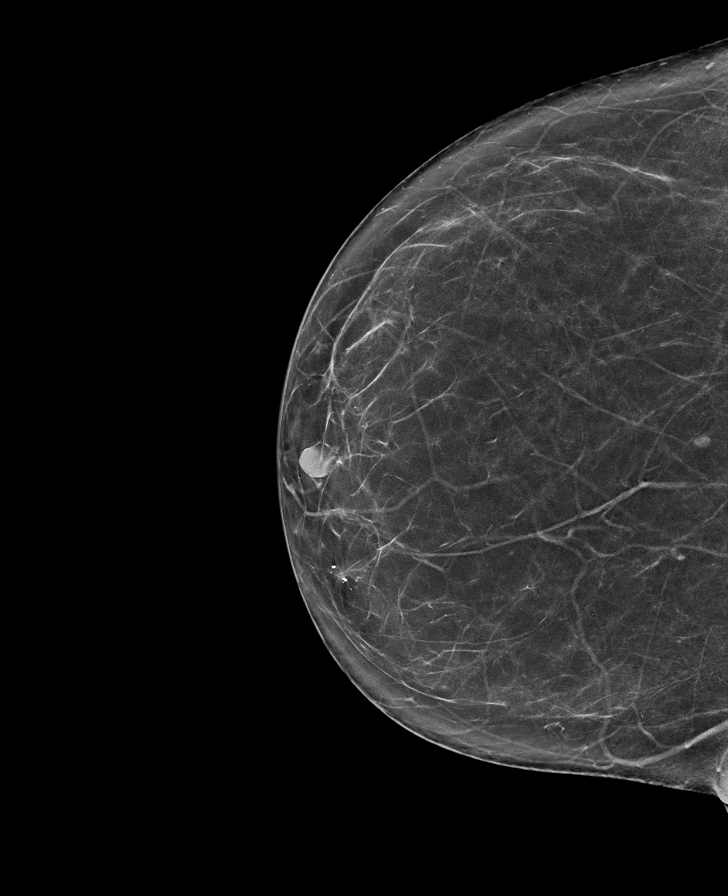

[L MLO synth-2D]
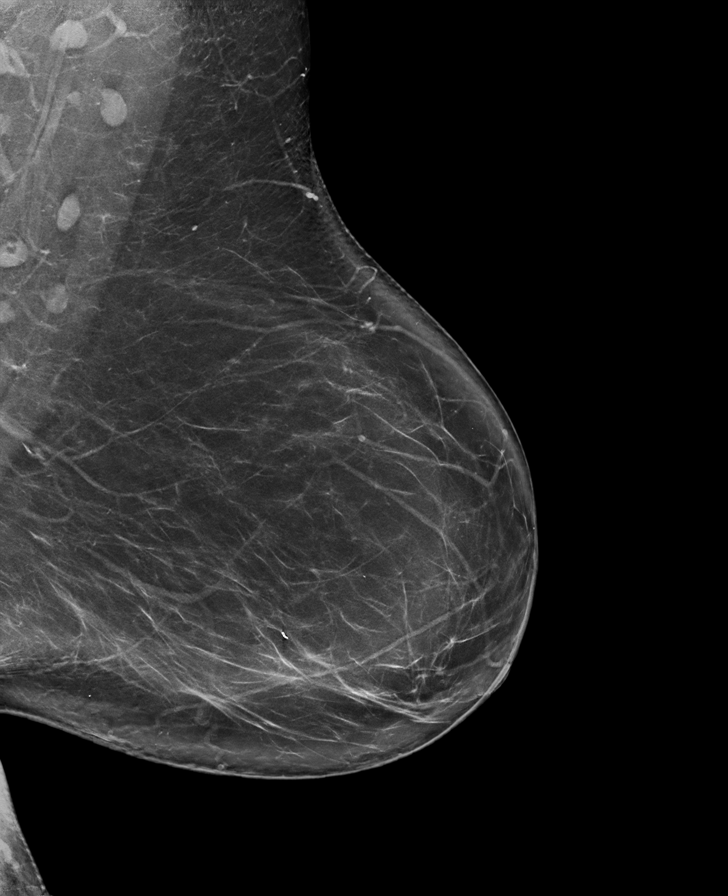

[R MLO synth-2D]
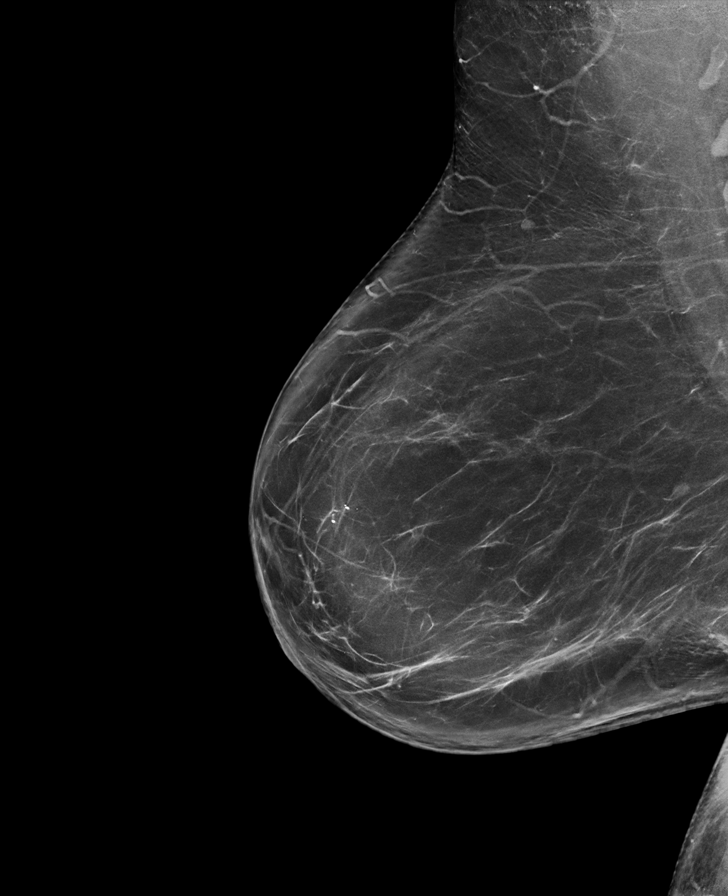

[L CC tomo · tomo slice 38/75.0]
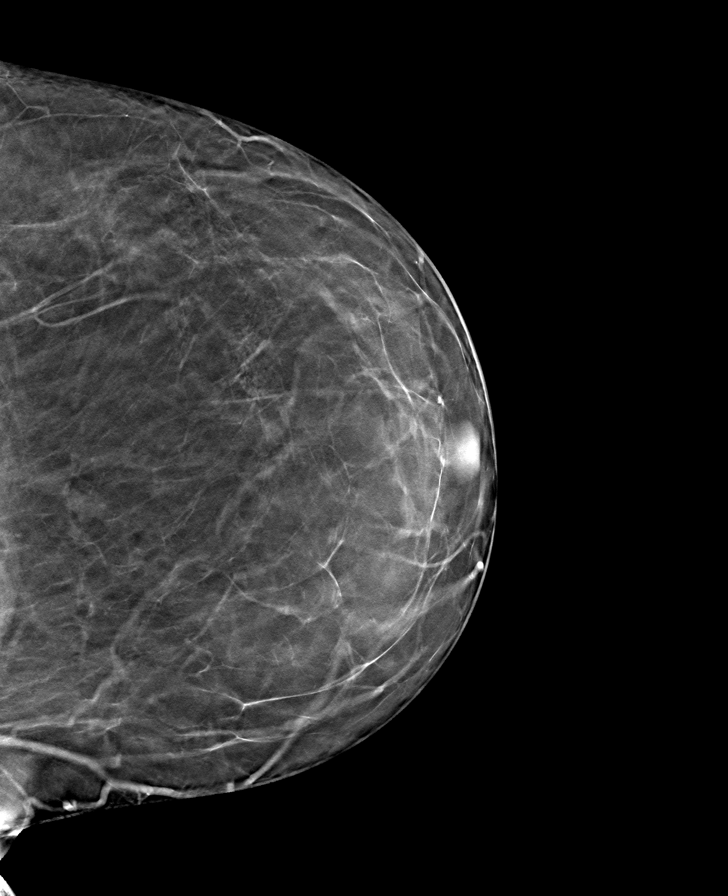

[R CC tomo · tomo slice 37/72.0]
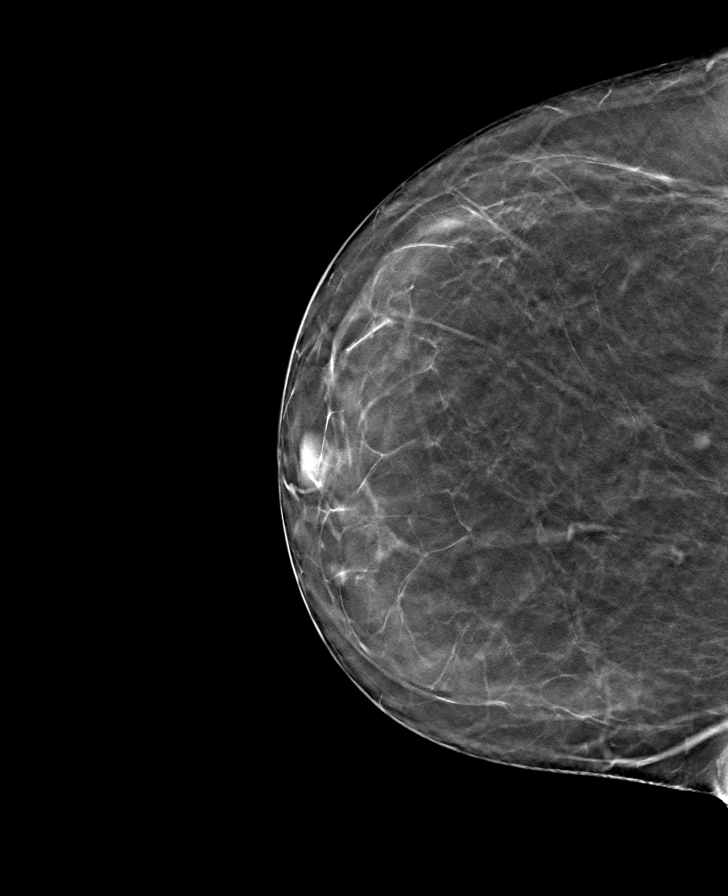

[R MLO tomo · tomo slice 46/91.0]
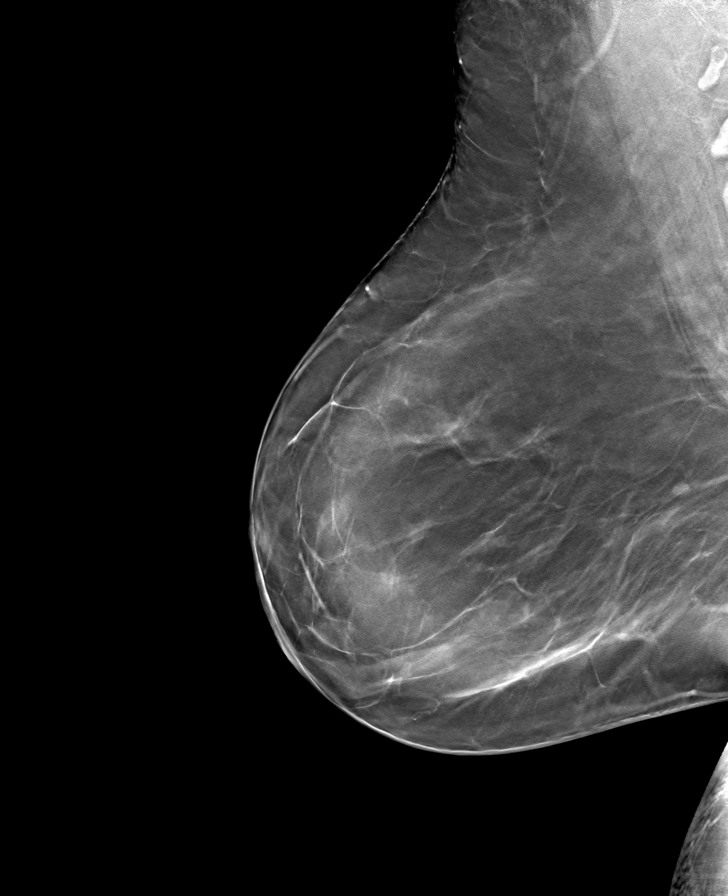

[L MLO tomo · tomo slice 47/93.0]
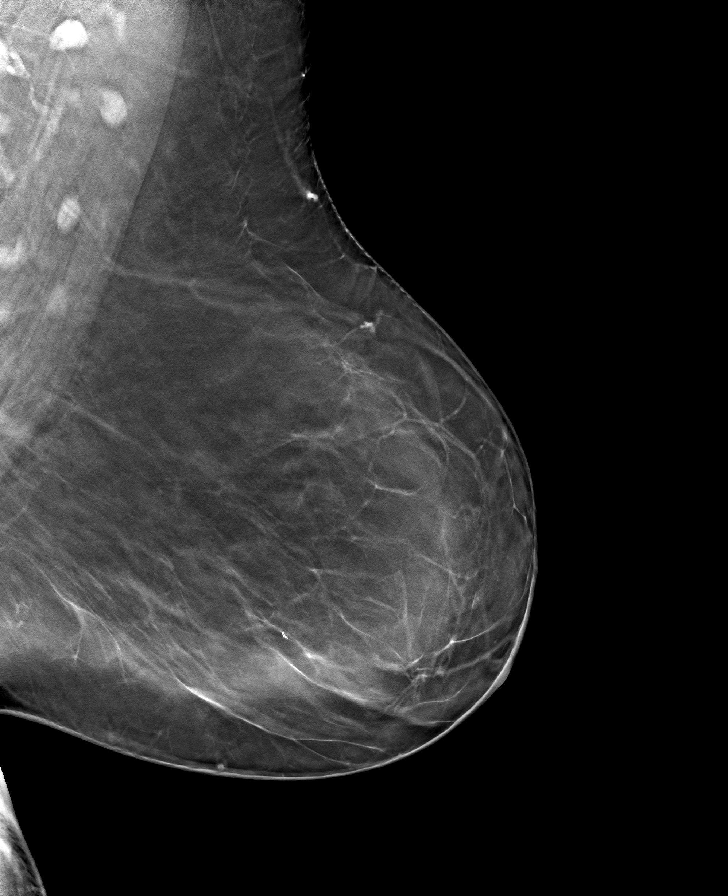

[8 of 24 positions shown; findings below may reference images not displayed]

FINDINGS: There are no findings suspicious for malignancy.
IMPRESSION: No mammographic evidence of malignancy. A result letter of this
screening mammogram will be mailed directly to the patient.

RECOMMENDATION:
Screening mammogram in one year. (Code:[id number]-[id number])

BI-RADS CATEGORY  1: Negative.

## 2021-10-30 ENCOUNTER — Other Ambulatory Visit: Payer: Self-pay | Admitting: Internal Medicine

## 2021-11-01 NOTE — Telephone Encounter (Signed)
Requested Prescriptions  Pending Prescriptions Disp Refills  . doxepin (SINEQUAN) 100 MG capsule [Pharmacy Med Name: DOXEPIN '100MG'$  (HUNDRED MG) CAPSULES] 90 capsule 0    Sig: TAKE 1 CAPSULE(100 MG) BY MOUTH AT BEDTIME     Psychiatry:  Antidepressants - Heterocyclics (TCAs) Passed - 10/30/2021  6:11 PM      Passed - Valid encounter within last 6 months    Recent Outpatient Visits          1 month ago Essential (primary) hypertension   [facility] Glean [facility], [facility]   5 months ago Essential (primary) hypertension   [facility] Glean [facility], [facility]   9 months ago Essential (primary) hypertension   [facility] Glean Hess, MD   1 year ago Essential (primary) hypertension   [facility] Glean Hess, MD   1 year ago Encounter for medication review   [place], [name]      Future Appointments            In 2 months Army Melia, Jesse Sans, MD [facility], [facility]          Psychiatry:  Antidepressants - Heterocyclics (TCAs) - doxepin Passed - 10/30/2021  6:11 PM      Passed - Valid encounter within last 12 months    Recent Outpatient Visits          1 month ago Essential (primary) hypertension   [facility] Glean Hess, MD   5 months ago Essential (primary) hypertension   [facility] Glean [facility], MD   9 months ago Essential (primary) hypertension   [facility], [facility]   1 year ago Essential (primary) hypertension   [facility] Glean Hess, MD   1 year ago Encounter for medication review   [place], [name]      Future Appointments            In 2 months Army Melia, Jesse Sans, MD [facility], [facility]           . montelukast (SINGULAIR) 10 MG tablet [Pharmacy Med Name: MONTELUKAST '10MG'$  TABLETS] 90 tablet 1    Sig: TAKE 1 TABLET BY MOUTH  EVERY NIGHT AT BEDTIME     Pulmonology:  Leukotriene Inhibitors Passed - 10/30/2021  6:11 PM      Passed - Valid encounter within last 12 months    Recent Outpatient Visits          1 month ago Essential (primary) hypertension   [facility]an [facility], MD   5 months ago Essential (primary) hypertension   [facility] Glean [facility], MD   9 months ago Essential (primary) hypertension   [facility] Glean [facility], MD   1 year ago Essential (primary) hypertension   [facility] Glean Hess, MD   1 year ago Encounter for medication review   [place], [name]      Future Appointments            In 2 months Army Melia, Jesse Sans, MD [facility], [place]

## 2021-11-28 ENCOUNTER — Other Ambulatory Visit: Payer: Self-pay | Admitting: Internal Medicine

## 2021-11-28 DIAGNOSIS — I10 Essential (primary) hypertension: Secondary | ICD-10-CM

## 2021-11-28 NOTE — Telephone Encounter (Signed)
Refilled 10/08/2021 #30 2 refills. Requested Prescriptions  Pending Prescriptions Disp Refills  . Nebivolol HCl 20 MG TABS [Pharmacy Med Name: NEBIVOLOL '20MG'$  TABLETS] 30 tablet 2    Sig: TAKE 1 TABLET(20 MG) BY MOUTH DAILY     Cardiovascular: Beta Blockers 3 Failed - 11/28/2021 11:00 AM      Failed - Last BP in normal range    BP Readings from Last 1 Encounters:  09/09/21 (!) 154/88         Passed - Cr in normal range and within 360 days    Creatinine  Date Value Ref Range Status  08/21/2013 0.66 0.60 - 1.30 mg/dL Final   Creatinine, Ser  Date Value Ref Range Status  05/20/2021 0.79 0.57 - 1.00 mg/dL Final         Passed - AST in normal range and within 360 days    AST  Date Value Ref Range Status  01/09/2021 21 0 - 40 IU/L Final   SGOT(AST)  Date Value Ref Range Status  03/07/2013 21 15 - 37 Unit/L Final         Passed - ALT in normal range and within 360 days    ALT  Date Value Ref Range Status  01/09/2021 14 0 - 32 IU/L Final   SGPT (ALT)  Date Value Ref Range Status  03/07/2013 26 12 - 78 U/L Final         Passed - Last Heart Rate in normal range    Pulse Readings from Last 1 Encounters:  09/09/21 71         Passed - Valid encounter within last 6 months    Recent Outpatient Visits          2 months ago Essential (primary) hypertension   [facility] Glean [facility], MD   6 months ago Essential (primary) hypertension   [facility], MD   10 months ago Essential (primary) hypertension   [facility], MD   1 year ago Essential (primary) hypertension   [facility] Glean [facility], MD   1 year ago Encounter for medication review   [place], [name]      Future Appointments            In 2 months Army Melia, Jesse Sans, MD [facility], [place]

## 2021-12-26 ENCOUNTER — Other Ambulatory Visit: Payer: Self-pay | Admitting: Internal Medicine

## 2021-12-26 DIAGNOSIS — M797 Fibromyalgia: Secondary | ICD-10-CM

## 2021-12-26 NOTE — Telephone Encounter (Signed)
Refilled 09/09/2021 #60 5 refills. Requested Prescriptions  Pending Prescriptions Disp Refills  . traZODone (DESYREL) 50 MG tablet [Pharmacy Med Name: TRAZODONE 50 MG TABLET] 60 tablet 5    Sig: TAKE 2 TABLETS BY MOUTH AT BEDTIME     Psychiatry: Antidepressants - Serotonin Modulator Passed - 12/26/2021  2:35 AM      Passed - Valid encounter within last 6 months    Recent Outpatient Visits          3 months ago Essential (primary) hypertension   [facility]an [facility], MD   7 months ago Essential (primary) hypertension   [facility] Glean [facility], MD   11 months ago Essential (primary) hypertension   [facility] Glean Hess, MD   1 year ago Essential (primary) hypertension   [facility] Glean Hess, MD   1 year ago Encounter for medication review   [place], [name]      Future Appointments            In 1 month Army Melia, Jesse Sans, MD [facility], [place]

## 2021-12-29 DIAGNOSIS — I10 Essential (primary) hypertension: Secondary | ICD-10-CM | POA: Diagnosis not present

## 2021-12-29 DIAGNOSIS — M65841 Other synovitis and tenosynovitis, right hand: Secondary | ICD-10-CM | POA: Diagnosis not present

## 2021-12-29 DIAGNOSIS — Z23 Encounter for immunization: Secondary | ICD-10-CM | POA: Diagnosis not present

## 2021-12-29 DIAGNOSIS — S60511A Abrasion of right hand, initial encounter: Secondary | ICD-10-CM | POA: Diagnosis not present

## 2021-12-29 DIAGNOSIS — L4050 Arthropathic psoriasis, unspecified: Secondary | ICD-10-CM | POA: Diagnosis not present

## 2021-12-29 DIAGNOSIS — J45909 Unspecified asthma, uncomplicated: Secondary | ICD-10-CM | POA: Diagnosis not present

## 2021-12-29 DIAGNOSIS — L03113 Cellulitis of right upper limb: Secondary | ICD-10-CM | POA: Diagnosis not present

## 2021-12-29 DIAGNOSIS — L089 Local infection of the skin and subcutaneous tissue, unspecified: Secondary | ICD-10-CM | POA: Diagnosis not present

## 2021-12-29 DIAGNOSIS — Z792 Long term (current) use of antibiotics: Secondary | ICD-10-CM | POA: Diagnosis not present

## 2021-12-29 DIAGNOSIS — Z28310 Unvaccinated for covid-19: Secondary | ICD-10-CM | POA: Diagnosis not present

## 2021-12-29 DIAGNOSIS — S60811A Abrasion of right wrist, initial encounter: Secondary | ICD-10-CM | POA: Diagnosis not present

## 2021-12-29 DIAGNOSIS — L4052 Psoriatic arthritis mutilans: Secondary | ICD-10-CM | POA: Diagnosis not present

## 2021-12-29 DIAGNOSIS — K219 Gastro-esophageal reflux disease without esophagitis: Secondary | ICD-10-CM | POA: Diagnosis not present

## 2021-12-29 DIAGNOSIS — Z791 Long term (current) use of non-steroidal anti-inflammatories (NSAID): Secondary | ICD-10-CM | POA: Diagnosis not present

## 2021-12-29 DIAGNOSIS — Z7982 Long term (current) use of aspirin: Secondary | ICD-10-CM | POA: Diagnosis not present

## 2021-12-29 DIAGNOSIS — L03011 Cellulitis of right finger: Secondary | ICD-10-CM | POA: Diagnosis not present

## 2022-01-04 DIAGNOSIS — M65141 Other infective (teno)synovitis, right hand: Secondary | ICD-10-CM | POA: Diagnosis not present

## 2022-01-04 DIAGNOSIS — L02511 Cutaneous abscess of right hand: Secondary | ICD-10-CM | POA: Diagnosis not present

## 2022-01-04 DIAGNOSIS — M6518 Other infective (teno)synovitis, other site: Secondary | ICD-10-CM | POA: Diagnosis not present

## 2022-01-04 DIAGNOSIS — M79644 Pain in right finger(s): Secondary | ICD-10-CM | POA: Diagnosis not present

## 2022-01-04 DIAGNOSIS — M7989 Other specified soft tissue disorders: Secondary | ICD-10-CM | POA: Diagnosis not present

## 2022-01-04 DIAGNOSIS — R6883 Chills (without fever): Secondary | ICD-10-CM | POA: Diagnosis not present

## 2022-01-04 DIAGNOSIS — M00842 Arthritis due to other bacteria, left hand: Secondary | ICD-10-CM | POA: Diagnosis not present

## 2022-01-04 DIAGNOSIS — R5383 Other fatigue: Secondary | ICD-10-CM | POA: Diagnosis not present

## 2022-01-04 DIAGNOSIS — S66324A Laceration of extensor muscle, fascia and tendon of right ring finger at wrist and hand level, initial encounter: Secondary | ICD-10-CM | POA: Diagnosis not present

## 2022-01-04 DIAGNOSIS — W5501XA Bitten by cat, initial encounter: Secondary | ICD-10-CM | POA: Diagnosis not present

## 2022-01-04 DIAGNOSIS — S60410A Abrasion of right index finger, initial encounter: Secondary | ICD-10-CM | POA: Diagnosis not present

## 2022-01-04 DIAGNOSIS — L089 Local infection of the skin and subcutaneous tissue, unspecified: Secondary | ICD-10-CM | POA: Diagnosis not present

## 2022-01-06 DIAGNOSIS — K219 Gastro-esophageal reflux disease without esophagitis: Secondary | ICD-10-CM | POA: Diagnosis not present

## 2022-01-06 DIAGNOSIS — L089 Local infection of the skin and subcutaneous tissue, unspecified: Secondary | ICD-10-CM | POA: Diagnosis not present

## 2022-01-06 DIAGNOSIS — I10 Essential (primary) hypertension: Secondary | ICD-10-CM | POA: Diagnosis not present

## 2022-01-06 DIAGNOSIS — W5503XA Scratched by cat, initial encounter: Secondary | ICD-10-CM | POA: Diagnosis not present

## 2022-01-06 DIAGNOSIS — J4520 Mild intermittent asthma, uncomplicated: Secondary | ICD-10-CM | POA: Diagnosis not present

## 2022-01-06 DIAGNOSIS — M009 Pyogenic arthritis, unspecified: Secondary | ICD-10-CM | POA: Diagnosis not present

## 2022-01-06 DIAGNOSIS — M659 Synovitis and tenosynovitis, unspecified: Secondary | ICD-10-CM | POA: Diagnosis not present

## 2022-01-06 DIAGNOSIS — E039 Hypothyroidism, unspecified: Secondary | ICD-10-CM | POA: Diagnosis not present

## 2022-01-06 DIAGNOSIS — R7982 Elevated C-reactive protein (CRP): Secondary | ICD-10-CM | POA: Diagnosis not present

## 2022-01-06 DIAGNOSIS — S60511A Abrasion of right hand, initial encounter: Secondary | ICD-10-CM | POA: Diagnosis not present

## 2022-01-06 DIAGNOSIS — L4050 Arthropathic psoriasis, unspecified: Secondary | ICD-10-CM | POA: Diagnosis not present

## 2022-01-06 DIAGNOSIS — L03011 Cellulitis of right finger: Secondary | ICD-10-CM | POA: Diagnosis not present

## 2022-01-06 DIAGNOSIS — L02511 Cutaneous abscess of right hand: Secondary | ICD-10-CM | POA: Diagnosis not present

## 2022-01-06 DIAGNOSIS — G8929 Other chronic pain: Secondary | ICD-10-CM | POA: Diagnosis not present

## 2022-01-06 DIAGNOSIS — I96 Gangrene, not elsewhere classified: Secondary | ICD-10-CM | POA: Diagnosis not present

## 2022-01-11 DIAGNOSIS — A419 Sepsis, unspecified organism: Secondary | ICD-10-CM | POA: Diagnosis not present

## 2022-01-11 DIAGNOSIS — M00841 Arthritis due to other bacteria, right hand: Secondary | ICD-10-CM | POA: Diagnosis not present

## 2022-01-14 ENCOUNTER — Other Ambulatory Visit: Payer: Self-pay | Admitting: Internal Medicine

## 2022-01-14 DIAGNOSIS — I10 Essential (primary) hypertension: Secondary | ICD-10-CM

## 2022-01-14 DIAGNOSIS — E039 Hypothyroidism, unspecified: Secondary | ICD-10-CM

## 2022-01-14 NOTE — Telephone Encounter (Signed)
Requested Prescriptions  Pending Prescriptions Disp Refills  . levothyroxine (SYNTHROID) 200 MCG tablet [Pharmacy Med Name: LEVOTHYROXINE 0.'2MG'$  (200MCG) TAB] 90 tablet 0    Sig: TAKE 1 TABLET(200 MCG) BY MOUTH DAILY     Endocrinology:  Hypothyroid Agents Failed - 01/14/2022 10:20 AM      Failed - TSH in normal range and within 360 days    TSH  Date Value Ref Range Status  01/09/2021 0.172 (L) 0.450 - 4.500 uIU/mL Final         Passed - Valid encounter within last 12 months    Recent Outpatient Visits          4 months ago Essential (primary) hypertension   [facility]an [facility], MD   7 months ago Essential (primary) hypertension   [facility] Glean Hess, MD   1 year ago Essential (primary) hypertension   [facility] Glean Hess, MD   1 year ago Essential (primary) hypertension   [facility] Glean Hess, MD   1 year ago Encounter for medication review   [place], [name]      Future Appointments            In 2 weeks Army Melia Jesse Sans, MD [facility], [place]           . doxazosin (CARDURA) 2 MG tablet [Pharmacy Med Name: DOXAZOSIN '2MG'$  TABLETS] 90 tablet 0    Sig: TAKE 1 TABLET(2 MG) BY MOUTH DAILY     Cardiovascular:  Alpha Blockers Failed - 01/14/2022 10:20 AM      Failed - Last BP in normal range    BP Readings from Last 1 Encounters:  09/09/21 (!) 154/88         Passed - Valid encounter within last 6 months    Recent Outpatient Visits          4 months ago Essential (primary) hypertension   [facility] Glean Hess, MD   7 months ago Essential (primary) hypertension   [facility] Glean Hess, MD   1 year ago Essential (primary) hypertension   [facility] Glean Hess, MD   1 year ago Essential (primary) hypertension   [facility] Glean Hess, MD   1 year ago Encounter for  medication review   [place], [name]      Future Appointments            In 2 weeks Glean Hess, MD [facility], [facility]            '

## 2022-01-15 ENCOUNTER — Other Ambulatory Visit: Payer: Self-pay

## 2022-01-15 DIAGNOSIS — M797 Fibromyalgia: Secondary | ICD-10-CM

## 2022-01-15 DIAGNOSIS — A419 Sepsis, unspecified organism: Secondary | ICD-10-CM | POA: Diagnosis not present

## 2022-01-15 MED ORDER — MELOXICAM 15 MG PO TABS: ORAL_TABLET | ORAL | 2 refills | Status: DC

## 2022-01-16 ENCOUNTER — Other Ambulatory Visit: Payer: Self-pay

## 2022-01-16 DIAGNOSIS — M797 Fibromyalgia: Secondary | ICD-10-CM

## 2022-01-16 DIAGNOSIS — M62838 Other muscle spasm: Secondary | ICD-10-CM

## 2022-01-16 MED ORDER — MELOXICAM 15 MG PO TABS: ORAL_TABLET | ORAL | 2 refills | Status: DC

## 2022-01-16 MED ORDER — CYCLOBENZAPRINE HCL 10 MG PO TABS: ORAL_TABLET | ORAL | 0 refills | Status: DC

## 2022-01-17 DIAGNOSIS — M659 Synovitis and tenosynovitis, unspecified: Secondary | ICD-10-CM | POA: Insufficient documentation

## 2022-01-18 DIAGNOSIS — R110 Nausea: Secondary | ICD-10-CM | POA: Diagnosis not present

## 2022-01-18 DIAGNOSIS — M65141 Other infective (teno)synovitis, right hand: Secondary | ICD-10-CM | POA: Diagnosis not present

## 2022-01-18 DIAGNOSIS — M00841 Arthritis due to other bacteria, right hand: Secondary | ICD-10-CM | POA: Diagnosis not present

## 2022-01-18 DIAGNOSIS — A419 Sepsis, unspecified organism: Secondary | ICD-10-CM | POA: Diagnosis not present

## 2022-01-18 DIAGNOSIS — B9689 Other specified bacterial agents as the cause of diseases classified elsewhere: Secondary | ICD-10-CM | POA: Diagnosis not present

## 2022-01-18 DIAGNOSIS — L02511 Cutaneous abscess of right hand: Secondary | ICD-10-CM | POA: Diagnosis not present

## 2022-01-20 ENCOUNTER — Other Ambulatory Visit: Payer: Self-pay | Admitting: Internal Medicine

## 2022-01-20 DIAGNOSIS — I10 Essential (primary) hypertension: Secondary | ICD-10-CM

## 2022-01-20 NOTE — Telephone Encounter (Signed)
Requested medication (s) are due for refill today: yes  Requested medication (s) are on the active medication list: yes  Last refill:  10/08/21 #30  Future visit scheduled: yes  Notes to clinic:  overdue lab work   Requested Prescriptions  Pending Prescriptions Disp Refills   Nebivolol HCl 20 MG TABS [Pharmacy Med Name: NEBIVOLOL 20 MG TABLET] 30 tablet 1    Sig: TAKE 1 TABLET BY MOUTH EVERY DAY     Cardiovascular: Beta Blockers 3 Failed - 01/20/2022  2:57 AM      Failed - AST in normal range and within 360 days    AST  Date Value Ref Range Status  01/09/2021 21 0 - 40 IU/L Final   SGOT(AST)  Date Value Ref Range Status  03/07/2013 21 15 - 37 Unit/L Final         Failed - ALT in normal range and within 360 days    ALT  Date Value Ref Range Status  01/09/2021 14 0 - 32 IU/L Final   SGPT (ALT)  Date Value Ref Range Status  03/07/2013 26 12 - 78 U/L Final         Failed - Last BP in normal range    BP Readings from Last 1 Encounters:  09/09/21 (!) 154/88         Passed - Cr in normal range and within 360 days    Creatinine  Date Value Ref Range Status  08/21/2013 0.66 0.60 - 1.30 mg/dL Final   Creatinine, Ser  Date Value Ref Range Status  05/20/2021 0.79 0.57 - 1.00 mg/dL Final         Passed - Last Heart Rate in normal range    Pulse Readings from Last 1 Encounters:  09/09/21 71         Passed - Valid encounter within last 6 months    Recent Outpatient Visits           4 months ago Essential (primary) hypertension   [facility] Glean [facility], MD   8 months ago Essential (primary) hypertension   [facility] Glean Hess, MD   1 year ago Essential (primary) hypertension   [facility] Glean Hess, MD   1 year ago Essential (primary) hypertension   [facility] Glean Hess, MD   1 year ago Encounter for medication review   [place], [name]        Future Appointments             In 1 week Army Melia, Jesse Sans, MD [facility], [place]

## 2022-01-22 DIAGNOSIS — L02511 Cutaneous abscess of right hand: Secondary | ICD-10-CM | POA: Diagnosis not present

## 2022-01-22 DIAGNOSIS — B9689 Other specified bacterial agents as the cause of diseases classified elsewhere: Secondary | ICD-10-CM | POA: Diagnosis not present

## 2022-01-22 DIAGNOSIS — M65141 Other infective (teno)synovitis, right hand: Secondary | ICD-10-CM | POA: Diagnosis not present

## 2022-01-22 DIAGNOSIS — R110 Nausea: Secondary | ICD-10-CM | POA: Diagnosis not present

## 2022-01-23 DIAGNOSIS — A419 Sepsis, unspecified organism: Secondary | ICD-10-CM | POA: Diagnosis not present

## 2022-01-23 DIAGNOSIS — M659 Synovitis and tenosynovitis, unspecified: Secondary | ICD-10-CM | POA: Diagnosis not present

## 2022-01-23 DIAGNOSIS — M00841 Arthritis due to other bacteria, right hand: Secondary | ICD-10-CM | POA: Diagnosis not present

## 2022-01-23 DIAGNOSIS — M25641 Stiffness of right hand, not elsewhere classified: Secondary | ICD-10-CM | POA: Diagnosis not present

## 2022-01-25 DIAGNOSIS — M00841 Arthritis due to other bacteria, right hand: Secondary | ICD-10-CM | POA: Diagnosis not present

## 2022-01-25 DIAGNOSIS — A419 Sepsis, unspecified organism: Secondary | ICD-10-CM | POA: Diagnosis not present

## 2022-01-28 DIAGNOSIS — A419 Sepsis, unspecified organism: Secondary | ICD-10-CM | POA: Diagnosis not present

## 2022-01-28 DIAGNOSIS — M00841 Arthritis due to other bacteria, right hand: Secondary | ICD-10-CM | POA: Diagnosis not present

## 2022-01-29 ENCOUNTER — Ambulatory Visit: Payer: Medicare Other | Admitting: Internal Medicine

## 2022-01-31 DIAGNOSIS — A419 Sepsis, unspecified organism: Secondary | ICD-10-CM | POA: Diagnosis not present

## 2022-01-31 DIAGNOSIS — M00841 Arthritis due to other bacteria, right hand: Secondary | ICD-10-CM | POA: Diagnosis not present

## 2022-02-01 DIAGNOSIS — A419 Sepsis, unspecified organism: Secondary | ICD-10-CM | POA: Diagnosis not present

## 2022-02-01 DIAGNOSIS — R7982 Elevated C-reactive protein (CRP): Secondary | ICD-10-CM | POA: Diagnosis not present

## 2022-02-01 DIAGNOSIS — M00841 Arthritis due to other bacteria, right hand: Secondary | ICD-10-CM | POA: Diagnosis not present

## 2022-02-01 DIAGNOSIS — M009 Pyogenic arthritis, unspecified: Secondary | ICD-10-CM | POA: Diagnosis not present

## 2022-02-01 DIAGNOSIS — M659 Synovitis and tenosynovitis, unspecified: Secondary | ICD-10-CM | POA: Diagnosis not present

## 2022-02-04 DIAGNOSIS — M009 Pyogenic arthritis, unspecified: Secondary | ICD-10-CM | POA: Diagnosis not present

## 2022-02-04 DIAGNOSIS — R7982 Elevated C-reactive protein (CRP): Secondary | ICD-10-CM | POA: Diagnosis not present

## 2022-02-04 DIAGNOSIS — M659 Synovitis and tenosynovitis, unspecified: Secondary | ICD-10-CM | POA: Diagnosis not present

## 2022-02-05 DIAGNOSIS — M659 Synovitis and tenosynovitis, unspecified: Secondary | ICD-10-CM | POA: Diagnosis not present

## 2022-02-05 DIAGNOSIS — L03011 Cellulitis of right finger: Secondary | ICD-10-CM | POA: Diagnosis not present

## 2022-02-06 ENCOUNTER — Other Ambulatory Visit: Payer: Self-pay | Admitting: Internal Medicine

## 2022-02-06 DIAGNOSIS — L03011 Cellulitis of right finger: Secondary | ICD-10-CM | POA: Diagnosis not present

## 2022-02-06 DIAGNOSIS — A419 Sepsis, unspecified organism: Secondary | ICD-10-CM | POA: Diagnosis not present

## 2022-02-06 DIAGNOSIS — M00841 Arthritis due to other bacteria, right hand: Secondary | ICD-10-CM | POA: Diagnosis not present

## 2022-02-06 DIAGNOSIS — M659 Synovitis and tenosynovitis, unspecified: Secondary | ICD-10-CM | POA: Diagnosis not present

## 2022-02-07 NOTE — Telephone Encounter (Signed)
Requested Prescriptions  Pending Prescriptions Disp Refills  . doxepin (SINEQUAN) 100 MG capsule [Pharmacy Med Name: DOXEPIN '100MG'$  (HUNDRED MG) CAPSULES] 90 capsule 0    Sig: TAKE 1 CAPSULE(100 MG) BY MOUTH AT BEDTIME     Psychiatry:  Antidepressants - Heterocyclics (TCAs) Passed - 02/06/2022 12:42 PM      Passed - Valid encounter within last 6 months    Recent Outpatient Visits          5 months ago Essential (primary) hypertension   [place] [facility], Jesse Sans, MD   8 months ago Essential (primary) hypertension   [place] [facility] at [facility], Jesse Sans, MD   1 year ago Essential (primary) hypertension   [place] [facility] at [facility], Jesse Sans, MD   1 year ago Essential (primary) hypertension   [place] [facility] at [facility], Jesse Sans, MD   1 year ago Encounter for medication review   [place], [name]      Future Appointments            In 3 months Army Melia, Jesse Sans, MD [facility] at [facility], [facility]          Psychiatry:  Antidepressants - Heterocyclics (TCAs) - doxepin Passed - 02/06/2022 12:42 PM      Passed - Valid encounter within last 12 months    Recent Outpatient Visits          5 months ago Essential (primary) hypertension   [place] [facility] at [facility] Jesse Sans, MD   8 months ago Essential (primary) hypertension   [place] [facility] at [facility], Jesse Sans, MD   1 year ago Essential (primary) hypertension   [place] [facility] at [facility], Jesse Sans, MD   1 year ago Essential (primary) hypertension   [place] [facility] at [facility], Jesse Sans, MD   1 year ago Encounter for medication review   [place], [name]      Future Appointments            In 3 months Army Melia, Jesse Sans, MD [place] and [facility] Medicine at [facility], [facility]

## 2022-02-13 DIAGNOSIS — M00841 Arthritis due to other bacteria, right hand: Secondary | ICD-10-CM | POA: Diagnosis not present

## 2022-02-13 DIAGNOSIS — M009 Pyogenic arthritis, unspecified: Secondary | ICD-10-CM | POA: Diagnosis not present

## 2022-02-13 DIAGNOSIS — M659 Synovitis and tenosynovitis, unspecified: Secondary | ICD-10-CM | POA: Diagnosis not present

## 2022-02-13 DIAGNOSIS — L02511 Cutaneous abscess of right hand: Secondary | ICD-10-CM | POA: Diagnosis not present

## 2022-02-13 DIAGNOSIS — A419 Sepsis, unspecified organism: Secondary | ICD-10-CM | POA: Diagnosis not present

## 2022-02-13 DIAGNOSIS — R7982 Elevated C-reactive protein (CRP): Secondary | ICD-10-CM | POA: Diagnosis not present

## 2022-02-14 DIAGNOSIS — M00841 Arthritis due to other bacteria, right hand: Secondary | ICD-10-CM | POA: Diagnosis not present

## 2022-02-14 DIAGNOSIS — A419 Sepsis, unspecified organism: Secondary | ICD-10-CM | POA: Diagnosis not present

## 2022-02-17 DIAGNOSIS — L02511 Cutaneous abscess of right hand: Secondary | ICD-10-CM | POA: Diagnosis not present

## 2022-02-17 DIAGNOSIS — R7982 Elevated C-reactive protein (CRP): Secondary | ICD-10-CM | POA: Diagnosis not present

## 2022-02-17 DIAGNOSIS — M659 Synovitis and tenosynovitis, unspecified: Secondary | ICD-10-CM | POA: Diagnosis not present

## 2022-02-17 DIAGNOSIS — M009 Pyogenic arthritis, unspecified: Secondary | ICD-10-CM | POA: Diagnosis not present

## 2022-02-21 DIAGNOSIS — M659 Synovitis and tenosynovitis, unspecified: Secondary | ICD-10-CM | POA: Diagnosis not present

## 2022-02-21 DIAGNOSIS — M25641 Stiffness of right hand, not elsewhere classified: Secondary | ICD-10-CM | POA: Diagnosis not present

## 2022-03-06 DIAGNOSIS — M009 Pyogenic arthritis, unspecified: Secondary | ICD-10-CM | POA: Diagnosis not present

## 2022-03-10 DIAGNOSIS — R7982 Elevated C-reactive protein (CRP): Secondary | ICD-10-CM | POA: Diagnosis not present

## 2022-03-10 DIAGNOSIS — M00841 Arthritis due to other bacteria, right hand: Secondary | ICD-10-CM | POA: Diagnosis not present

## 2022-03-10 DIAGNOSIS — M659 Synovitis and tenosynovitis, unspecified: Secondary | ICD-10-CM | POA: Diagnosis not present

## 2022-03-19 ENCOUNTER — Other Ambulatory Visit: Payer: Self-pay | Admitting: Internal Medicine

## 2022-03-19 DIAGNOSIS — K277 Chronic peptic ulcer, site unspecified, without hemorrhage or perforation: Secondary | ICD-10-CM

## 2022-03-20 NOTE — Telephone Encounter (Signed)
Requested Prescriptions  Pending Prescriptions Disp Refills  . esomeprazole (NEXIUM) 40 MG capsule [Pharmacy Med Name: ESOMEPRAZOLE MAG DR 40 MG CAP] 90 capsule 0    Sig: TAKE 1 CAPSULE BY MOUTH EVERY DAY BEFORE SUPPER(5:OOPM)     Gastroenterology: Proton Pump Inhibitors 2 Failed - 03/19/2022  2:32 PM      Failed - ALT in normal range and within 360 days    ALT  Date Value Ref Range Status  01/09/2021 14 0 - 32 IU/L Final   SGPT (ALT)  Date Value Ref Range Status  03/07/2013 26 12 - 78 U/L Final         Failed - AST in normal range and within 360 days    AST  Date Value Ref Range Status  01/09/2021 21 0 - 40 IU/L Final   SGOT(AST)  Date Value Ref Range Status  03/07/2013 21 15 - 38 Unit/L Final         Passed - Valid encounter within last 12 months    Recent Outpatient Visits          6 months ago Essential (primary) hypertension   [place] [facility] at [facility], Jesse Sans, MD   10 months ago Essential (primary) hypertension   [place] [facility] at [facility], Jesse Sans, MD   1 year ago Essential (primary) hypertension   [place] [facility] at [facility], Jesse Sans, MD   1 year ago Essential (primary) hypertension   [place] [facility] at [facility], Jesse Sans, MD   1 year ago Encounter for medication review   [place], [name]      Future Appointments            In 1 month Army Melia, Jesse Sans, MD [place] and Sports Medicine at [facility], [facility]

## 2022-03-28 ENCOUNTER — Other Ambulatory Visit: Payer: Self-pay | Admitting: Internal Medicine

## 2022-03-28 ENCOUNTER — Other Ambulatory Visit: Payer: Self-pay

## 2022-03-28 ENCOUNTER — Ambulatory Visit (INDEPENDENT_AMBULATORY_CARE_PROVIDER_SITE_OTHER): Payer: Medicare Other

## 2022-03-28 VITALS — BP 128/70 | HR 67 | Ht 61.0 in | Wt 233.6 lb

## 2022-03-28 DIAGNOSIS — I10 Essential (primary) hypertension: Secondary | ICD-10-CM

## 2022-03-28 DIAGNOSIS — E039 Hypothyroidism, unspecified: Secondary | ICD-10-CM

## 2022-03-28 DIAGNOSIS — M62838 Other muscle spasm: Secondary | ICD-10-CM

## 2022-03-28 DIAGNOSIS — K277 Chronic peptic ulcer, site unspecified, without hemorrhage or perforation: Secondary | ICD-10-CM

## 2022-03-28 DIAGNOSIS — N950 Postmenopausal bleeding: Secondary | ICD-10-CM

## 2022-03-28 DIAGNOSIS — Z23 Encounter for immunization: Secondary | ICD-10-CM

## 2022-03-28 DIAGNOSIS — Z0000 Encounter for general adult medical examination without abnormal findings: Secondary | ICD-10-CM | POA: Diagnosis not present

## 2022-03-28 DIAGNOSIS — M797 Fibromyalgia: Secondary | ICD-10-CM

## 2022-03-28 DIAGNOSIS — Z1231 Encounter for screening mammogram for malignant neoplasm of breast: Secondary | ICD-10-CM

## 2022-03-28 DIAGNOSIS — Z1509 Genetic susceptibility to other malignant neoplasm: Secondary | ICD-10-CM

## 2022-03-28 DIAGNOSIS — Z2989 Encounter for other specified prophylactic measures: Secondary | ICD-10-CM

## 2022-03-28 MED ORDER — ESOMEPRAZOLE MAGNESIUM 40 MG PO CPDR: DELAYED_RELEASE_CAPSULE | ORAL | 1 refills | Status: DC

## 2022-03-28 MED ORDER — MONTELUKAST SODIUM 10 MG PO TABS: ORAL_TABLET | ORAL | 1 refills | Status: DC

## 2022-03-28 MED ORDER — CYCLOBENZAPRINE HCL 10 MG PO TABS: ORAL_TABLET | ORAL | 1 refills | Status: DC

## 2022-03-28 MED ORDER — TRAZODONE HCL 50 MG PO TABS: 100.0000 mg | ORAL_TABLET | Freq: Every day | ORAL | 5 refills | Status: DC

## 2022-03-28 MED ORDER — DOXEPIN HCL 100 MG PO CAPS: ORAL_CAPSULE | ORAL | 1 refills | Status: DC

## 2022-03-28 MED ORDER — MELOXICAM 15 MG PO TABS: ORAL_TABLET | ORAL | 1 refills | Status: DC

## 2022-03-28 MED ORDER — DOXAZOSIN MESYLATE 4 MG PO TABS: 4.0000 mg | ORAL_TABLET | Freq: Every day | ORAL | 1 refills | Status: DC

## 2022-03-28 MED ORDER — NEBIVOLOL HCL 20 MG PO TABS: 1.0000 | ORAL_TABLET | Freq: Every day | ORAL | 1 refills | Status: DC

## 2022-03-28 MED ORDER — LISINOPRIL 40 MG PO TABS: 40.0000 mg | ORAL_TABLET | Freq: Every day | ORAL | 1 refills | Status: DC

## 2022-03-28 MED ORDER — TAMOXIFEN CITRATE 20 MG PO TABS: 20.0000 mg | ORAL_TABLET | Freq: Every day | ORAL | 1 refills | Status: DC

## 2022-03-28 MED ORDER — LEVOTHYROXINE SODIUM 200 MCG PO TABS: ORAL_TABLET | ORAL | 1 refills | Status: DC

## 2022-03-28 NOTE — Progress Notes (Signed)
Subjective:   [name] is a 61 y.o. female who presents for Medicare Annual (Subsequent) preventive examination.  I connected with  [name] on 03/28/22 by an in person visit and verified that I am speaking with the correct person using two identifiers.  Patient Location: Other:  Publishing copy Location: Office/Clinic  I discussed the limitations of evaluation and management by telemedicine. The patient expressed understanding and agreed to proceed.   Review of Systems    Defer to PCP Cardiac Risk Factors include: advanced age (>98men, >28 women);obesity (BMI >30kg/m2)     Objective:    Today's Vitals   03/28/22 0930  BP: 128/70  Pulse: 67  SpO2: 95%  Weight: 233 lb 9.6 oz (106 kg)  Height: $Remove'5\' 1"'tjtjPiK$  (1.549 m)  PainSc: 0-No pain   Body mass index is 44.14 kg/m.     03/28/2022   10:01 AM 05/19/2019    9:33 AM 05/19/2019    9:25 AM 03/25/2019    2:54 PM 12/10/2018   12:33 PM 01/17/2016   10:48 AM 10/01/2015    1:44 PM  Advanced Directives  Does Patient Have a Medical Advance Directive? Yes Yes No Yes Yes Yes Yes  Type of Advance Directive Living will;Healthcare Power of [place]    Does patient want to make changes to medical advance directive?    No - Patient declined No - Patient declined    Copy of [place] in Chart? No - copy requested   Yes - validated most recent copy scanned in chart (See row information) Yes - validated most recent copy scanned in chart (See row information)    Would patient like information on creating a medical advance directive?   No - Patient declined        Current Medications (verified) Outpatient Encounter Medications as of 03/28/2022  Medication Sig   albuterol (VENTOLIN HFA) 108 (90 Base) MCG/ACT inhaler Inhale 2 puffs into the lungs 4 (four) times daily as needed.   aspirin 81 MG EC tablet Take 81 mg by mouth at  bedtime.    blood glucose meter kit and supplies KIT Dispense based on patient and insurance preference. Use up to four times daily as directed. (FOR ICD-9 250.00, 250.01).   cetirizine (ZYRTEC) 10 MG tablet Take 10 mg by mouth every morning.    cyclobenzaprine (FLEXERIL) 10 MG tablet TAKE 1 TABLET(10 MG) BY MOUTH THREE TIMES DAILY   diphenhydrAMINE (BENADRYL) 25 MG tablet Take 25-50 mg by mouth every 6 (six) hours as needed (allergies/asthma).   doxazosin (CARDURA) 2 MG tablet TAKE 1 TABLET(2 MG) BY MOUTH DAILY (Patient taking differently: Take 4 mg by mouth daily.)   doxepin (SINEQUAN) 100 MG capsule TAKE 1 CAPSULE(100 MG) BY MOUTH AT BEDTIME   econazole nitrate 1 % cream Apply under breasts and in groin creases 1-2 times a day as needed.   esomeprazole (NEXIUM) 40 MG capsule TAKE 1 CAPSULE BY MOUTH EVERY DAY BEFORE SUPPER(5:OOPM)   FIBER, GUAR GUM, PO Take by mouth.   Fluocinolone Acetonide 0.01 % OIL Apply 1-2 drops to ear canals 1-2 times daily as needed for psoriasis.   fluticasone (FLONASE) 50 MCG/ACT nasal spray Place 2 sprays into both nostrils daily.   FREESTYLE LITE test strip    lactobacillus acidophilus (BACID) TABS tablet Take 2 tablets by mouth daily.   Lancets (FREESTYLE) lancets 1 each 4 (four) times daily.  levothyroxine (SYNTHROID) 200 MCG tablet TAKE 1 TABLET(200 MCG) BY MOUTH DAILY   lisinopril (ZESTRIL) 40 MG tablet Take 1 tablet (40 mg total) by mouth daily.   loperamide (IMODIUM A-D) 2 MG tablet Take 2-4 mg by mouth 4 (four) times daily as needed for diarrhea or loose stools.    meloxicam (MOBIC) 15 MG tablet TAKE 1 TABLET(15 MG) BY MOUTH DAILY   montelukast (SINGULAIR) 10 MG tablet TAKE 1 TABLET BY MOUTH EVERY NIGHT AT BEDTIME   Multiple Vitamins-Minerals (MULTIVITAMIN WOMEN 50+ PO) Take by mouth.   Nebivolol HCl 20 MG TABS TAKE 1 TABLET BY MOUTH EVERY DAY   OVER THE COUNTER MEDICATION Woman's multvitamin-Take 1 gummie daily.   tamoxifen (NOLVADEX) 20 MG tablet Take 1  tablet (20 mg total) by mouth daily.   traZODone (DESYREL) 50 MG tablet Take 2 tablets (100 mg total) by mouth at bedtime.   vitamin B-12 (CYANOCOBALAMIN) 1000 MCG tablet Take 1500 mcg-2 gummie daily.   No facility-administered encounter medications on file as of 03/28/2022.    Allergies (verified) Sulfa antibiotics, Amlodipine, Furosemide, Lyrica [pregabalin], and Hydrochlorothiazide   History: Past Medical History:  Diagnosis Date   Allergic rhinitis    Anemia    Arthritis    Asthma    well controlled   Basal cell carcinoma 07/1997   Treated by Dr Sharlett Iles   BRCA2 gene mutation positive 12/2018   MyRisk BRCA 2 positive with special interpretation--mutation has less penetrance than usual BRCA 2 mutation   Bursitis    Complication of anesthesia    thrashing and screaming after cholecystectomy   Dermatitis, eczematoid 09/30/2011   Duodenal ulcer    Family history of breast cancer    Fibromyalgia    Gastritis    GERD (gastroesophageal reflux disease)    Headache    h/o migraines   Heart murmur    asymptomatic   Hiatal hernia    History of kidney stones    h/o   Hypertension    Hypothyroidism    IBS (irritable bowel syndrome)    OSA (obstructive sleep apnea)    has CPAP-not using currently due to needing new mask as of 05-13-19   Pneumonia 2009   Psoriasis    Rheumatic fever    Past Surgical History:  Procedure Laterality Date   APPENDECTOMY     BREAST BIOPSY Left 2007   benign, dr Bary Castilla   CHOLECYSTECTOMY     CYSTOSCOPY  05/19/2019   Procedure: CYSTOSCOPY;  Surgeon: Homero Fellers, MD;  Location: [facility] OR[facility];  Service: Gynecology;;   HYSTEROSCOPY WITH D & C N/A 12/14/2018   Procedure: DILATATION AND CURETTAGE /HYSTEROSCOPY WITH MYOSURE, VULVAR BIOPSY;  Surgeon: Homero Fellers, MD;  Location: [facility] OR[facility];  Service: Gynecology;  Laterality: N/A;   SKIN BIOPSY Left    Back on L leg    SKIN CANCER EXCISION     BCCA of face   TONSILLECTOMY AND ADENOIDECTOMY      TOTAL LAPAROSCOPIC HYSTERECTOMY WITH SALPINGECTOMY N/A 05/19/2019   Procedure: TOTAL LAPAROSCOPIC HYSTERECTOMY WITH SALPINGECTOMY/ TLH/BSO;  Surgeon: Homero Fellers, MD;  Location: [facility] OR[facility];  Service: Gynecology;  Laterality: N/A;   Family History  Problem Relation Age of Onset   Breast cancer Mother 2   CAD Father    Heart attack Father    Bladder Cancer Father 70   Breast cancer Paternal Grandmother 37   Leukemia Paternal Grandmother 69   Diabetes Other        multiple family  members   Breast cancer Maternal Aunt 64   Social History   Socioeconomic History   Marital status: Married    Spouse name: Not on file   Number of children: Not on file   Years of education: Not on file   Highest education level: Not on file  Occupational History   Occupation: disabled  Tobacco Use   Smoking status: Never   Smokeless tobacco: Never  Vaping Use   Vaping Use: Never used  Substance and Sexual Activity   Alcohol use: Yes    Alcohol/week: 2.0 standard drinks of alcohol    Types: 2 Standard drinks or equivalent per week    Comment: rarely   Drug use: No   Sexual activity: Yes    Birth control/protection: Post-menopausal  Other Topics Concern   Not on file  Social History Narrative   Not on file   Social Determinants of Health   Financial Resource Strain: Not on file  Food Insecurity: Not on file  Transportation Needs: Not on file  Physical Activity: Inactive (03/28/2022)   Exercise Vital Sign    Days of Exercise per Week: 0 days    Minutes of Exercise per Session: 0 min  Stress: Stress Concern Present (03/28/2022)   [place]    Feeling of Stress : To some extent  Social Connections: Moderately Integrated (03/28/2022)   Social Connection and Isolation Panel [NHANES]    Frequency of Communication with Friends and Family: More than three times a week    Frequency of Social Gatherings with Friends  and Family: More than three times a week    Attends Religious Services: More than 4 times per year    Active Member of [employer] or Organizations: No    Attends Music therapist: Never    Marital Status: Married    Tobacco Counseling Counseling given: Not Answered   Clinical Intake:  Pre-visit preparation completed: Yes  Pain : 0-10 Pain Score: 0-No pain Pain Type: Acute pain Pain Location: Finger (Comment which one) Pain Orientation: Right (Rt hand, ring finger.) Pain Descriptors / Indicators: Aching     BMI - recorded: 44.14 Nutritional Status: BMI > 30  Obese Nutritional Risks: None Diabetes: No     Diabetic? No.  Interpreter Needed?: No  Information entered by :: Wyatt Haste, [place] of Daily Living    03/28/2022    9:52 AM 09/09/2021    1:27 PM  In your present state of health, do you have any difficulty performing the following activities:  Hearing? 0 1  Vision? 0 0  Difficulty concentrating or making decisions? 0 0  Walking or climbing stairs? 0 1  Dressing or bathing? 0 1  Doing errands, shopping? 0 1  Preparing Food and eating ? N   Using the Toilet? N   In the past six months, have you accidently leaked urine? N   Do you have problems with loss of bowel control? N   Managing your Medications? N   Managing your Finances? N   Housekeeping or managing your Housekeeping? N     Patient Care Team: Glean Hess, MD as PCP - General (Internal Medicine) Ralene Bathe, MD (Dermatology) Homero Fellers, MD as Consulting Physician (Obstetrics and Gynecology)  Indicate any recent Medical Services you may have received from other than [facility] providers in the past year (date may be approximate).     Assessment:   This is a  routine wellness examination for [place].  Hearing/Vision screen No results found.  Dietary issues and exercise activities discussed: Current Exercise Habits: The patient does not participate in  regular exercise at present   Goals Addressed             This Visit's Progress    DIET - INCREASE WATER INTAKE        Depression Screen    03/28/2022    9:51 AM 09/09/2021    1:27 PM 05/20/2021    1:33 PM 01/09/2021   10:09 AM 05/09/2020   10:00 AM 11/21/2019   10:36 AM 05/13/2019    1:31 PM  PHQ 2/9 Scores  PHQ - 2 Score 1 0 0 1 0 2 0  PHQ- 9 Score $Remov'9 6 3 7 3 8     'uvOyfP$ Fall Risk    03/28/2022    9:52 AM 09/09/2021    1:27 PM 05/20/2021    1:33 PM 01/09/2021   10:10 AM 05/09/2020   10:00 AM  Fall Risk   Falls in the past year? $RemoveBe'1 1 1 1 1  'KfsBEjGkM$ Number falls in past yr: $Remove'1 1 1 1 'UFcakmb$ 0  Injury with Fall? 0 1 1 0 0  Risk for fall due to : History of fall(s) History of fall(s) History of fall(s) History of fall(s)   Follow up Falls evaluation completed Falls evaluation completed Falls evaluation completed Falls evaluation completed Falls evaluation completed    [place]:  Any stairs in or around the home? No  If so, are there any without handrails?  N/A Home free of loose throw rugs in walkways, pet beds, electrical cords, etc? Yes  Adequate lighting in your home to reduce risk of falls? Yes   ASSISTIVE DEVICES UTILIZED TO PREVENT FALLS:  Life alert? No  Use of a cane, walker or w/c? Yes  Grab bars in the bathroom? No  Shower chair or bench in shower? No  Elevated toilet seat or a handicapped toilet? No   TIMED UP AND GO:  Was the test performed? Yes .  Gait steady and fast without use of assistive device  Cognitive Function:        03/28/2022   10:02 AM  6CIT Screen  What Year? 0 points  What month? 0 points  What time? 0 points  Count back from 20 0 points  Months in reverse 0 points  Repeat phrase 4 points  Total Score 4 points    Immunizations Immunization History  Administered Date(s) Administered   Influenza,inj,Quad PF,6+ Mos 03/19/2015, 03/02/2017, 07/19/2018, 05/13/2019, 05/09/2020, 05/20/2021, 03/28/2022   PFIZER  Comirnaty(Gray Top)Covid-19 Tri-Sucrose Vaccine 09/27/2020   PFIZER(Purple Top)SARS-COV-2 Vaccination 09/02/2019, 09/28/2019, 09/07/2020   Pneumococcal Conjugate-13 01/17/2016   Pneumococcal Polysaccharide-23 06/10/2010   Tdap 12/29/2021   Zoster Recombinat (Shingrix) 11/21/2019, 02/15/2020    TDAP status: Due, Education has been provided regarding the importance of this vaccine. Advised may receive this vaccine at local pharmacy or Health Dept. Aware to provide a copy of the vaccination record if obtained from local pharmacy or Health Dept. Verbalized acceptance and understanding.  Flu Vaccine status: Completed at today's visit  Pneumococcal vaccine status: Up to date  Covid-19 vaccine status: Completed vaccines  Qualifies for Shingles Vaccine? Yes   Zostavax completed No   Shingrix Completed?: Yes  Screening Tests Health Maintenance  Topic Date Due   MAMMOGRAM  01/04/2022   COVID-19 Vaccine (5 - Pfizer risk series) 04/13/2022 (Originally 11/22/2020)   HIV Screening  09/10/2022 (Originally 10/30/1975)   COLONOSCOPY (Pts 45-51yrs Insurance coverage will need to be confirmed)  06/10/2022   TETANUS/TDAP  12/30/2031   INFLUENZA VACCINE  Completed   Zoster Vaccines- Shingrix  Completed   Hepatitis C Screening  Addressed   Pneumococcal Vaccine 64-18 Years old  Aged Out   HPV VACCINES  Aged Out    Health Maintenance  Health Maintenance Due  Topic Date Due   MAMMOGRAM  01/04/2022    Colorectal cancer screening: Type of screening: Colonoscopy. Completed 06/10/2012. Repeat every 10 years  Mammogram status: Completed 01/04/2021. Repeat every year  Lung Cancer Screening: (Low Dose CT Chest recommended if Age 62-80 years, 30 pack-year currently smoking OR have quit w/in 15years.) does not qualify.   Lung Cancer Screening Referral: N/A  Additional Screening:  Hepatitis C Screening: does qualify; Completed 01/17/2016  Vision Screening: Recommended annual ophthalmology exams for  early detection of glaucoma and other disorders of the eye. Is the patient up to date with their annual eye exam?  Yes  Who is the provider or what is the name of the office in which the patient attends annual eye exams? [facility] If pt is not established with a provider, would they like to be referred to a provider to establish care?  N/A .   Dental Screening: Recommended annual dental exams for proper oral hygiene  Community Resource Referral / Chronic Care Management: CRR required this visit?  No   CCM required this visit?  No      Plan:     I have personally reviewed and noted the following in the patient's chart:   Medical and social history Use of alcohol, tobacco or illicit drugs  Current medications and supplements including opioid prescriptions. Patient is not currently taking opioid prescriptions. Functional ability and status Nutritional status Physical activity Advanced directives List of other physicians Hospitalizations, surgeries, and ER visits in previous 12 months Vitals Screenings to include cognitive, depression, and falls Referrals and appointments  In addition, I have reviewed and discussed with patient certain preventive protocols, quality metrics, and best practice recommendations. A written personalized care plan for preventive services as well as general preventive health recommendations were provided to patient.     Clista Bernhardt, [place]   03/28/2022   Nurse Notes: Patient asking if PCP can start prescribing tamoxifen 20 mg once daily. She has been out of this medication for almost 2 months. This is her 2nd year on the medication. She said her GYN released her from being seen, and told her to stick with PCP.

## 2022-04-01 ENCOUNTER — Other Ambulatory Visit: Payer: Self-pay | Admitting: Internal Medicine

## 2022-04-01 DIAGNOSIS — I10 Essential (primary) hypertension: Secondary | ICD-10-CM

## 2022-04-02 NOTE — Telephone Encounter (Signed)
Change of pharmacy  Requested Prescriptions  Pending Prescriptions Disp Refills  . Nebivolol HCl 20 MG TABS [Pharmacy Med Name: NEBIVOLOL 20 MG TABLET] 30 tablet 1    Sig: TAKE 1 TABLET BY MOUTH EVERY DAY     Cardiovascular: Beta Blockers 3 Failed - 04/01/2022  1:52 AM      Failed - AST in normal range and within 360 days    AST  Date Value Ref Range Status  01/09/2021 21 0 - 40 IU/L Final   SGOT(AST)  Date Value Ref Range Status  03/07/2013 21 15 - 37 Unit/L Final         Failed - ALT in normal range and within 360 days    ALT  Date Value Ref Range Status  01/09/2021 14 0 - 32 IU/L Final   SGPT (ALT)  Date Value Ref Range Status  03/07/2013 26 12 - 78 U/L Final         Failed - Valid encounter within last 6 months    Recent Outpatient Visits          6 months ago Essential (primary) hypertension   [place] [facility] at [facility], Jesse Sans, MD   10 months ago Essential (primary) hypertension   [place] [facility] Medicine at [facility], Jesse Sans, MD   1 year ago Essential (primary) hypertension   [place] [facility] Medicine at [facility], Jesse Sans, MD   1 year ago Essential (primary) hypertension   [place] Primary Care and Sports Medicine at [facility], Jesse Sans, MD   1 year ago Encounter for medication review   [place], [name]      Future Appointments            In 1 month Glean Hess, MD [facility] Care and Sports Medicine at [facility], [place] in normal range and within 360 days    Creatinine  Date Value Ref Range Status  08/21/2013 0.66 0.60 - 1.30 mg/dL Final   Creatinine, Ser  Date Value Ref Range Status  05/20/2021 0.79 0.57 - 1.00 mg/dL Final         Passed - Last BP in normal range    BP Readings from Last 1 Encounters:  03/28/22  128/70         Passed - Last Heart Rate in normal range    Pulse Readings from Last 1 Encounters:  03/28/22 67

## 2022-04-04 DIAGNOSIS — L03011 Cellulitis of right finger: Secondary | ICD-10-CM | POA: Diagnosis not present

## 2022-04-04 DIAGNOSIS — M659 Synovitis and tenosynovitis, unspecified: Secondary | ICD-10-CM | POA: Diagnosis not present

## 2022-04-08 DIAGNOSIS — M25641 Stiffness of right hand, not elsewhere classified: Secondary | ICD-10-CM | POA: Diagnosis not present

## 2022-04-08 DIAGNOSIS — M659 Synovitis and tenosynovitis, unspecified: Secondary | ICD-10-CM | POA: Diagnosis not present

## 2022-04-11 DIAGNOSIS — M25641 Stiffness of right hand, not elsewhere classified: Secondary | ICD-10-CM | POA: Diagnosis not present

## 2022-04-11 DIAGNOSIS — M659 Synovitis and tenosynovitis, unspecified: Secondary | ICD-10-CM | POA: Diagnosis not present

## 2022-04-15 DIAGNOSIS — M25641 Stiffness of right hand, not elsewhere classified: Secondary | ICD-10-CM | POA: Diagnosis not present

## 2022-04-15 DIAGNOSIS — M659 Synovitis and tenosynovitis, unspecified: Secondary | ICD-10-CM | POA: Diagnosis not present

## 2022-04-22 ENCOUNTER — Other Ambulatory Visit: Payer: Self-pay | Admitting: Internal Medicine

## 2022-04-22 DIAGNOSIS — M62838 Other muscle spasm: Secondary | ICD-10-CM

## 2022-04-22 NOTE — Telephone Encounter (Signed)
Requested medication (s) are due for refill today - no  Requested medication (s) are on the active medication list -yes  Future visit scheduled -yes  Last refill: 03/28/22 #270 1RF  Notes to clinic: non delegated Rx  Requested Prescriptions  Pending Prescriptions Disp Refills   cyclobenzaprine (FLEXERIL) 10 MG tablet [Pharmacy Med Name: CYCLOBENZAPRINE 10 MG TABLET] 270 tablet 1    Sig: TAKE 1 TABLET(10 MG) BY MOUTH THREE TIMES DAILY     Not Delegated - Analgesics:  Muscle Relaxants Failed - 04/22/2022 12:17 PM      Failed - This refill cannot be delegated      Failed - Valid encounter within last 6 months    Recent Outpatient Visits           7 months ago Essential (primary) hypertension   [place] [facility] Medicine at [facility], Jesse Sans, MD   11 months ago Essential (primary) hypertension   [place] [facility] and Sports Medicine at [facility], Jesse Sans, MD   1 year ago Essential (primary) hypertension   [place] [facility] Care and Sports Medicine at [facility], Jesse Sans, MD   1 year ago Essential (primary) hypertension   [place] [facility] Care and Sports Medicine at [facility], Jesse Sans, MD   1 year ago Encounter for medication review   [place], [name]       Future Appointments             In 3 weeks Army Melia Jesse Sans, MD [facility] Primary Care and Sports Medicine at [facility], [facility]               Requested Prescriptions  Pending Prescriptions Disp Refills   cyclobenzaprine (FLEXERIL) 10 MG tablet [Pharmacy Med Name: CYCLOBENZAPRINE 10 MG TABLET] 270 tablet 1    Sig: TAKE 1 TABLET(10 MG) BY MOUTH THREE TIMES DAILY     Not Delegated - Analgesics:  Muscle Relaxants Failed - 04/22/2022 12:17 PM      Failed - This refill cannot be delegated      Failed - Valid encounter within last 6 months    Recent Outpatient  Visits           7 months ago Essential (primary) hypertension   [place] [facility] Care and Sports Medicine at [facility], Jesse Sans, MD   11 months ago Essential (primary) hypertension   [place] [facility] Care and Sports Medicine at [facility], Jesse Sans, MD   1 year ago Essential (primary) hypertension   [place] Primary Care and Sports Medicine at [facility], Jesse Sans, MD   1 year ago Essential (primary) hypertension    Primary Care and Sports Medicine at [facility], Jesse Sans, MD   1 year ago Encounter for medication review   [place], [name]       Future Appointments             In 3 weeks Army Melia, Jesse Sans, MD [place] Primary Care and Sports Medicine at [facility], [facility]

## 2022-04-25 ENCOUNTER — Other Ambulatory Visit: Payer: Self-pay | Admitting: Internal Medicine

## 2022-04-25 DIAGNOSIS — E039 Hypothyroidism, unspecified: Secondary | ICD-10-CM

## 2022-04-25 NOTE — Telephone Encounter (Signed)
Unable to refill per protocol, Rx request is too soon, last RF 03/28/22 for 90 and 1 RF. Will refuse.  Requested Prescriptions  Pending Prescriptions Disp Refills   levothyroxine (SYNTHROID) 200 MCG tablet [Pharmacy Med Name: LEVOTHYROXINE 0.'2MG'$  (200MCG) TAB] 90 tablet 1    Sig: TAKE 1 TABLET(200 MCG) BY MOUTH DAILY     Endocrinology:  Hypothyroid Agents Failed - 04/25/2022  2:15 PM      Failed - TSH in normal range and within 360 days    TSH  Date Value Ref Range Status  01/09/2021 0.172 (L) 0.450 - 4.500 uIU/mL Final         Passed - Valid encounter within last 12 months    Recent Outpatient Visits           7 months ago Essential (primary) hypertension   [place] [facility] Care and Sports Medicine at [facility], Jesse Sans, MD   11 months ago Essential (primary) hypertension   [place] [facility] and Sports Medicine at [facility], Jesse Sans, MD   1 year ago Essential (primary) hypertension   [facility] Care and Sports Medicine at [facility], Jesse Sans, MD   1 year ago Essential (primary) hypertension   [place] Primary Care and Sports Medicine at [facility], Jesse Sans, MD   1 year ago Encounter for medication review   [place], [name]       Future Appointments             In 2 weeks Army Melia, Jesse Sans, MD [place] and Sports Medicine at [facility], [facility]

## 2022-05-13 ENCOUNTER — Ambulatory Visit: Payer: Medicare Other | Admitting: Internal Medicine

## 2022-05-16 ENCOUNTER — Ambulatory Visit: Payer: Medicare Other | Admitting: Internal Medicine

## 2022-05-22 ENCOUNTER — Other Ambulatory Visit: Payer: Self-pay | Admitting: Internal Medicine

## 2022-05-22 DIAGNOSIS — M797 Fibromyalgia: Secondary | ICD-10-CM

## 2022-05-22 NOTE — Telephone Encounter (Signed)
Requested Prescriptions  Pending Prescriptions Disp Refills   meloxicam (MOBIC) 15 MG tablet [Pharmacy Med Name: MELOXICAM 15 MG TABLET] 30 tablet 2    Sig: TAKE 1 TABLET (15 MG) BY MOUTH DAILY     Analgesics:  COX2 Inhibitors Failed - 05/22/2022  2:03 AM      Failed - Manual Review: Labs are only required if the patient has taken medication for more than 8 weeks.      Failed - HGB in normal range and within 360 days    Hemoglobin  Date Value Ref Range Status  01/09/2021 13.8 11.1 - 15.9 g/dL Final         Failed - Cr in normal range and within 360 days    Creatinine  Date Value Ref Range Status  08/21/2013 0.66 0.60 - 1.30 mg/dL Final   Creatinine, Ser  Date Value Ref Range Status  05/20/2021 0.79 0.57 - 1.00 mg/dL Final         Failed - HCT in normal range and within 360 days    Hematocrit  Date Value Ref Range Status  01/09/2021 41.9 34.0 - 46.6 % Final         Failed - AST in normal range and within 360 days    AST  Date Value Ref Range Status  01/09/2021 21 0 - 40 IU/L Final   SGOT(AST)  Date Value Ref Range Status  03/07/2013 21 15 - 37 Unit/L Final         Failed - ALT in normal range and within 360 days    ALT  Date Value Ref Range Status  01/09/2021 14 0 - 32 IU/L Final   SGPT (ALT)  Date Value Ref Range Status  03/07/2013 26 12 - 78 U/L Final         Failed - eGFR is 30 or above and within 360 days    EGFR (African American)  Date Value Ref Range Status  08/21/2013 >60  Final   GFR calc Af Amer  Date Value Ref Range Status  11/21/2019 119 >59 mL/min/1.73 Final    Comment:    **[facility] currently reports eGFR in compliance with the current**   recommendations of the [employer]. [facility] will   update reporting as new guidelines are published from the NKF-ASN   Task force.    EGFR (Non-African Amer.)  Date Value Ref Range Status  08/21/2013 >60  Final    Comment:    eGFR values <22m/min/1.73 m2 may be an indication of  chronic kidney disease (CKD). Calculated eGFR is useful in patients with stable renal function. The eGFR calculation will not be reliable in acutely ill patients when serum creatinine is changing rapidly. It is not useful in  patients on dialysis. The eGFR calculation may not be applicable to patients at the low and high extremes of body sizes, pregnant women, and vegetarians.    GFR calc non Af Amer  Date Value Ref Range Status  11/21/2019 104 >59 mL/min/1.73 Final   eGFR  Date Value Ref Range Status  05/20/2021 85 >59 mL/min/1.73 Final         Passed - Patient is not pregnant      Passed - Valid encounter within last 12 months    Recent Outpatient Visits           8 months ago Essential (primary) hypertension   [place] [facility]   1 year  ago Essential (primary) hypertension   [place] [facility] at [facility], Jesse Sans, MD   1 year ago Essential (primary) hypertension   [place] [facility] at [facility], Jesse Sans, MD   2 years ago Essential (primary) hypertension   [place] [facility] at [facility], Jesse Sans, MD   2 years ago Encounter for medication review   [place], [name]

## 2022-06-14 ENCOUNTER — Other Ambulatory Visit: Payer: Self-pay | Admitting: Internal Medicine

## 2022-06-14 DIAGNOSIS — K277 Chronic peptic ulcer, site unspecified, without hemorrhage or perforation: Secondary | ICD-10-CM

## 2022-06-16 NOTE — Telephone Encounter (Signed)
Requested medications are due for refill today.  no  Requested medications are on the active medications list.  yes  Last refill. 03/28/2022 #90 1 rf  Future visit scheduled.   no  Notes to clinic.  [place] to go to [place]. Last encounters are from provider's in [place].    Requested Prescriptions  Pending Prescriptions Disp Refills   esomeprazole (NEXIUM) 40 MG capsule [Pharmacy Med Name: ESOMEPRAZOLE MAG DR 40 MG CAP] 90 capsule 1    Sig: TAKE 1 CAPSULE BY MOUTH EVERY DAY BEFORE SUPPER(5:OOPM)     Gastroenterology: Proton Pump Inhibitors 2 Failed - 06/14/2022  8:32 AM      Failed - ALT in normal range and within 360 days    ALT  Date Value Ref Range Status  01/09/2021 14 0 - 32 IU/L Final   SGPT (ALT)  Date Value Ref Range Status  03/07/2013 26 12 - 78 U/L Final         Failed - AST in normal range and within 360 days    AST  Date Value Ref Range Status  01/09/2021 21 0 - 40 IU/L Final   SGOT(AST)  Date Value Ref Range Status  03/07/2013 21 15 - 37 Unit/L Final         Passed - Valid encounter within last 12 months    Recent Outpatient Visits           9 months ago Essential (primary) hypertension   [place] [facility] at [facility], Jesse Sans, MD   1 year ago Essential (primary) hypertension   [place] [facility] at [facility], Jesse Sans, MD   1 year ago Essential (primary) hypertension   [place] [facility] at [facility], Jesse Sans, MD   2 years ago Essential (primary) hypertension   [place] [facility] at [facility], Jesse Sans, MD   2 years ago Encounter for medication review   [place], [name]

## 2022-06-19 ENCOUNTER — Other Ambulatory Visit: Payer: Self-pay | Admitting: Internal Medicine

## 2022-06-19 DIAGNOSIS — I10 Essential (primary) hypertension: Secondary | ICD-10-CM

## 2022-06-19 NOTE — Telephone Encounter (Signed)
Patient needs OV, will refill for 30 days until OV can be made. OV needed for additional refills.  Requested Prescriptions  Pending Prescriptions Disp Refills   Nebivolol HCl 20 MG TABS [Pharmacy Med Name: NEBIVOLOL 20 MG TABLET] 30 tablet 0    Sig: TAKE 1 TABLET BY MOUTH EVERY DAY     Cardiovascular: Beta Blockers 3 Failed - 06/19/2022  1:53 AM      Failed - Cr in normal range and within 360 days    Creatinine  Date Value Ref Range Status  08/21/2013 0.66 0.60 - 1.30 mg/dL Final   Creatinine, Ser  Date Value Ref Range Status  05/20/2021 0.79 0.57 - 1.00 mg/dL Final         Failed - AST in normal range and within 360 days    AST  Date Value Ref Range Status  01/09/2021 21 0 - 40 IU/L Final   SGOT(AST)  Date Value Ref Range Status  03/07/2013 21 15 - 37 Unit/L Final         Failed - ALT in normal range and within 360 days    ALT  Date Value Ref Range Status  01/09/2021 14 0 - 32 IU/L Final   SGPT (ALT)  Date Value Ref Range Status  03/07/2013 26 12 - 78 U/L Final         Failed - Valid encounter within last 6 months    Recent Outpatient Visits           9 months ago Essential (primary) hypertension   [place] [facility] at [facility], Jesse Sans, MD   1 year ago Essential (primary) hypertension   [place] [facility] at [facility], Jesse Sans, MD   1 year ago Essential (primary) hypertension   [place] [facility] at [facility], Jesse Sans, MD   2 years ago Essential (primary) hypertension   [place] [facility] at [facility], Jesse Sans, MD   2 years ago Encounter for medication review   [place] BP in normal range    BP Readings from Last 1 Encounters:  03/28/22 128/70         Passed - Last Heart Rate in normal range     Pulse Readings from Last 1 Encounters:  03/28/22 67

## 2022-07-22 ENCOUNTER — Other Ambulatory Visit: Payer: Self-pay | Admitting: Internal Medicine

## 2022-07-22 DIAGNOSIS — I10 Essential (primary) hypertension: Secondary | ICD-10-CM

## 2022-07-23 NOTE — Telephone Encounter (Signed)
Requested medication (s) are due for refill today: yes  Requested medication (s) are on the active medication list: yes  Last refill:  06/19/22  Future visit scheduled: yes  Notes to clinic:    Pharmacy comment: [place]. DX Code Needed.       Requested Prescriptions  Pending Prescriptions Disp Refills   Nebivolol HCl 20 MG TABS [Pharmacy Med Name: NEBIVOLOL 20 MG TABLET] 90 tablet 1    Sig: TAKE 1 TABLET BY MOUTH EVERY DAY     Cardiovascular: Beta Blockers 3 Failed - 07/22/2022 12:33 PM      Failed - Cr in normal range and within 360 days    Creatinine  Date Value Ref Range Status  08/21/2013 0.66 0.60 - 1.30 mg/dL Final   Creatinine, Ser  Date Value Ref Range Status  05/20/2021 0.79 0.57 - 1.00 mg/dL Final         Failed - AST in normal range and within 360 days    AST  Date Value Ref Range Status  01/09/2021 21 0 - 40 IU/L Final   SGOT(AST)  Date Value Ref Range Status  03/07/2013 21 15 - 37 Unit/L Final         Failed - ALT in normal range and within 360 days    ALT  Date Value Ref Range Status  01/09/2021 14 0 - 32 IU/L Final   SGPT (ALT)  Date Value Ref Range Status  03/07/2013 26 12 - 78 U/L Final         Failed - Valid encounter within last 6 months    Recent Outpatient Visits           10 months ago Essential (primary) hypertension   [place] [facility] at [facility], Jesse Sans, MD   1 year ago Essential (primary) hypertension   [place] [facility] at [facility], Jesse Sans, MD   1 year ago Essential (primary) hypertension   [place] [facility], Jesse Sans, MD   2 years ago Essential (primary) hypertension   [place] at [facility], Jesse Sans, MD   2 years ago Encounter for medication review   [place], [name]              Passed - Last BP in normal range    BP Readings from Last 1 Encounters:  03/28/22 128/70         Passed - Last Heart Rate in normal range    Pulse Readings from Last 1 Encounters:  03/28/22 67

## 2022-08-06 ENCOUNTER — Other Ambulatory Visit: Payer: Self-pay | Admitting: Internal Medicine

## 2022-08-06 DIAGNOSIS — I10 Essential (primary) hypertension: Secondary | ICD-10-CM

## 2022-08-09 ENCOUNTER — Other Ambulatory Visit: Payer: Self-pay | Admitting: Internal Medicine

## 2022-08-09 DIAGNOSIS — M797 Fibromyalgia: Secondary | ICD-10-CM

## 2022-08-11 NOTE — Telephone Encounter (Signed)
Requested medication (s) are due for refill today: Yes  Requested medication (s) are on the active medication list: Yes  Last refill:  03/28/22  Future visit scheduled: No  Notes to clinic:  Unable to refill per protocol due to failed labs, no updated results.      Requested Prescriptions  Pending Prescriptions Disp Refills   meloxicam (MOBIC) 15 MG tablet [Pharmacy Med Name: MELOXICAM '15MG'$  TABLETS] 30 tablet     Sig: TAKE 1 TABLET(15 MG) BY MOUTH DAILY     Analgesics:  COX2 Inhibitors Failed - 08/09/2022  7:45 AM      Failed - Manual Review: Labs are only required if the patient has taken medication for more than 8 weeks.      Failed - HGB in normal range and within 360 days    Hemoglobin  Date Value Ref Range Status  01/09/2021 13.8 11.1 - 15.9 g/dL Final         Failed - Cr in normal range and within 360 days    Creatinine  Date Value Ref Range Status  08/21/2013 0.66 0.60 - 1.30 mg/dL Final   Creatinine, Ser  Date Value Ref Range Status  05/20/2021 0.79 0.57 - 1.00 mg/dL Final         Failed - HCT in normal range and within 360 days    Hematocrit  Date Value Ref Range Status  01/09/2021 41.9 34.0 - 46.6 % Final         Failed - AST in normal range and within 360 days    AST  Date Value Ref Range Status  01/09/2021 21 0 - 40 IU/L Final   SGOT(AST)  Date Value Ref Range Status  03/07/2013 21 15 - 37 Unit/L Final         Failed - ALT in normal range and within 360 days    ALT  Date Value Ref Range Status  01/09/2021 14 0 - 32 IU/L Final   SGPT (ALT)  Date Value Ref Range Status  03/07/2013 26 12 - 78 U/L Final         Failed - eGFR is 30 or above and within 360 days    EGFR (African American)  Date Value Ref Range Status  08/21/2013 >60  Final   GFR calc Af Amer  Date Value Ref Range Status  11/21/2019 119 >59 mL/min/1.73 Final    Comment:    **[facility] currently reports eGFR in compliance with the current**   recommendations of the [facility]. [facility] will   update reporting as new guidelines are published from the NKF-ASN   Task force.    EGFR (Non-African Amer.)  Date Value Ref Range Status  08/21/2013 >60  Final    Comment:    eGFR values <75m/min/1.73 m2 may be an indication of chronic kidney disease (CKD). Calculated eGFR is useful in patients with stable renal function. The eGFR calculation will not be reliable in acutely ill patients when serum creatinine is changing rapidly. It is not useful in  patients on dialysis. The eGFR calculation may not be applicable to patients at the low and high extremes of body sizes, pregnant women, and vegetarians.    GFR calc non Af Amer  Date Value Ref Range Status  11/21/2019 104 >59 mL/min/1.73 Final   eGFR  Date Value Ref Range Status  05/20/2021 85 >59 mL/min/1.73 Final         Passed - Patient is not pregnant  Passed - Valid encounter within last 12 months    Recent Outpatient Visits           11 months ago Essential (primary) hypertension   [place] [facility] at [facility], Jesse Sans, MD   1 year ago Essential (primary) hypertension   [place] [facility] at [facility], Jesse Sans, MD   1 year ago Essential (primary) hypertension   [place] [facility] at [facility], Jesse Sans, MD   2 years ago Essential (primary) hypertension   [place] at [facility], Jesse Sans, MD   2 years ago Encounter for medication review   [place], [name]

## 2022-08-13 ENCOUNTER — Telehealth: Payer: Self-pay | Admitting: Internal Medicine

## 2022-08-13 ENCOUNTER — Other Ambulatory Visit: Payer: Self-pay

## 2022-08-13 DIAGNOSIS — M797 Fibromyalgia: Secondary | ICD-10-CM

## 2022-08-13 MED ORDER — MELOXICAM 15 MG PO TABS: ORAL_TABLET | ORAL | 1 refills | Status: DC

## 2022-08-13 NOTE — Telephone Encounter (Signed)
Copied from [place] [id number]. Topic: Appointment Scheduling - Scheduling Inquiry for Clinic >> Aug 13, 2022  1:20 PM Chapman Fitch wrote: Reason for CRM: pt and her husband Deane Gettinger will be in town from [place] on Monday March 11th and 12th only and are asking if Dr. Army Melia or any provider can see them for medication f/u or if labs can be order to get blood work done on Tuesday while they are in town and have a virtual appt / please advise asap

## 2022-08-13 NOTE — Telephone Encounter (Signed)
Patient will be back in town sometime end of May beginning of June, please send refills.  Patient mentioned that husband was prescribed Farziga (which he's completely out) and now showing medicines for Jardiance she's not sure because they were never notified of him taking the Jardiance or this was done by mistake. [facility] [place].   She tried to fill her Mobic and that it was not approved by the doctor, she also needs new prescription for Tramadol. [facility] is [facility] [place].

## 2022-08-14 NOTE — Telephone Encounter (Signed)
Unable to reach patient by phone - sent patient and her husband both my chart messages about this.  [name]

## 2022-08-28 DIAGNOSIS — K080 Exfoliation of teeth due to systemic causes: Secondary | ICD-10-CM | POA: Diagnosis not present

## 2022-10-20 ENCOUNTER — Other Ambulatory Visit: Payer: Self-pay | Admitting: Internal Medicine

## 2022-11-07 ENCOUNTER — Encounter: Payer: Self-pay | Admitting: Internal Medicine

## 2022-11-07 ENCOUNTER — Ambulatory Visit (INDEPENDENT_AMBULATORY_CARE_PROVIDER_SITE_OTHER): Payer: Medicare Other | Admitting: Internal Medicine

## 2022-11-07 VITALS — BP 128/76 | HR 75 | Ht 61.0 in | Wt 227.0 lb

## 2022-11-07 DIAGNOSIS — J4520 Mild intermittent asthma, uncomplicated: Secondary | ICD-10-CM

## 2022-11-07 DIAGNOSIS — E782 Mixed hyperlipidemia: Secondary | ICD-10-CM

## 2022-11-07 DIAGNOSIS — M797 Fibromyalgia: Secondary | ICD-10-CM | POA: Diagnosis not present

## 2022-11-07 DIAGNOSIS — E039 Hypothyroidism, unspecified: Secondary | ICD-10-CM | POA: Diagnosis not present

## 2022-11-07 DIAGNOSIS — I10 Essential (primary) hypertension: Secondary | ICD-10-CM

## 2022-11-07 DIAGNOSIS — Z1501 Genetic susceptibility to malignant neoplasm of breast: Secondary | ICD-10-CM

## 2022-11-07 DIAGNOSIS — G4733 Obstructive sleep apnea (adult) (pediatric): Secondary | ICD-10-CM

## 2022-11-07 DIAGNOSIS — R7303 Prediabetes: Secondary | ICD-10-CM

## 2022-11-07 DIAGNOSIS — F5101 Primary insomnia: Secondary | ICD-10-CM

## 2022-11-07 DIAGNOSIS — M62838 Other muscle spasm: Secondary | ICD-10-CM

## 2022-11-07 DIAGNOSIS — K277 Chronic peptic ulcer, site unspecified, without hemorrhage or perforation: Secondary | ICD-10-CM

## 2022-11-07 DIAGNOSIS — Z6841 Body Mass Index (BMI) 40.0 and over, adult: Secondary | ICD-10-CM

## 2022-11-07 MED ORDER — DOXEPIN HCL 100 MG PO CAPS
ORAL_CAPSULE | ORAL | 1 refills | Status: DC
Start: 2022-11-07 — End: 2022-12-30

## 2022-11-07 MED ORDER — ESOMEPRAZOLE MAGNESIUM 40 MG PO CPDR
DELAYED_RELEASE_CAPSULE | ORAL | 1 refills | Status: DC
Start: 2022-11-07 — End: 2023-07-20

## 2022-11-07 MED ORDER — DOXAZOSIN MESYLATE 4 MG PO TABS: 4.0000 mg | ORAL_TABLET | Freq: Every day | ORAL | 1 refills | Status: DC

## 2022-11-07 MED ORDER — TRAZODONE HCL 50 MG PO TABS: 100.0000 mg | ORAL_TABLET | Freq: Every day | ORAL | 5 refills | Status: DC

## 2022-11-07 MED ORDER — NEBIVOLOL HCL 20 MG PO TABS: 1.0000 | ORAL_TABLET | Freq: Every day | ORAL | 1 refills | Status: DC

## 2022-11-07 MED ORDER — LISINOPRIL 40 MG PO TABS: 40.0000 mg | ORAL_TABLET | Freq: Every day | ORAL | 1 refills | Status: DC

## 2022-11-07 MED ORDER — TAMOXIFEN CITRATE 20 MG PO TABS: 20.0000 mg | ORAL_TABLET | Freq: Every day | ORAL | 1 refills | Status: DC

## 2022-11-07 MED ORDER — TRAMADOL HCL 50 MG PO TABS
50.0000 mg | ORAL_TABLET | Freq: Two times a day (BID) | ORAL | 0 refills | Status: DC | PRN
Start: 2022-11-07 — End: 2022-12-30

## 2022-11-07 MED ORDER — CYCLOBENZAPRINE HCL 10 MG PO TABS: 10.0000 mg | ORAL_TABLET | Freq: Three times a day (TID) | ORAL | 1 refills | Status: DC

## 2022-11-07 MED ORDER — MELOXICAM 15 MG PO TABS: ORAL_TABLET | ORAL | 1 refills | Status: DC

## 2022-11-07 MED ORDER — LEVOTHYROXINE SODIUM 200 MCG PO TABS: ORAL_TABLET | ORAL | 1 refills | Status: DC

## 2022-11-07 NOTE — Assessment & Plan Note (Signed)
Chronic insomnia - on Trazodone and Doxepin.

## 2022-11-07 NOTE — Assessment & Plan Note (Signed)
Managed with diet alone. Lab Results  Component Value Date   HGBA1C 6.2 (H) 05/20/2021
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
## 2022-11-07 NOTE — Assessment & Plan Note (Addendum)
Stable exam with well controlled BP.  Currently taking Cardura, lisinopril and Bystolic. Tolerating medications without concerns or side effects. Will continue to recommend low sodium diet and current regimen.

## 2022-11-07 NOTE — Assessment & Plan Note (Signed)
Not currently on medications. 10 yr risk was average in 2022. Will recheck today.

## 2022-11-07 NOTE — Assessment & Plan Note (Signed)
supplemented

## 2022-11-07 NOTE — Assessment & Plan Note (Addendum)
CPap machine has stopped working. Will need to find a place that can provide a new machine. She had a sleep study in 2021 so will give Rxs for CPAP and supplies

## 2022-11-07 NOTE — Assessment & Plan Note (Signed)
Symptoms managed with nsaids (mobic) and flexeril

## 2022-11-07 NOTE — Assessment & Plan Note (Signed)
Asthma controlled. Allergy symptoms managed with Singulair, Flonase

## 2022-11-08 LAB — COMPREHENSIVE METABOLIC PANEL
ALT: 18 IU/L (ref 0–32)
AST: 38 IU/L (ref 0–40)
Albumin/Globulin Ratio: 1.6 (ref 1.2–2.2)
Albumin: 4 g/dL (ref 3.9–4.9)
Alkaline Phosphatase: 103 IU/L (ref 44–121)
BUN/Creatinine Ratio: 16 (ref 12–28)
BUN: 11 mg/dL (ref 8–27)
Bilirubin Total: 0.3 mg/dL (ref 0.0–1.2)
CO2: 23 mmol/L (ref 20–29)
Calcium: 9.1 mg/dL (ref 8.7–10.3)
Chloride: 101 mmol/L (ref 96–106)
Creatinine, Ser: 0.67 mg/dL (ref 0.57–1.00)
Globulin, Total: 2.5 g/dL (ref 1.5–4.5)
Glucose: 141 mg/dL — ABNORMAL HIGH (ref 70–99)
Potassium: 4.5 mmol/L (ref 3.5–5.2)
Sodium: 140 mmol/L (ref 134–144)
Total Protein: 6.5 g/dL (ref 6.0–8.5)
eGFR: 98 mL/min/{1.73_m2} (ref >59)

## 2022-11-08 LAB — CBC WITH DIFFERENTIAL/PLATELET
Basophils Absolute: 0.1 10*3/uL (ref 0.0–0.2)
Basos: 1 %
EOS (ABSOLUTE): 0.2 10*3/uL (ref 0.0–0.4)
Eos: 2 %
Hematocrit: 39.5 % (ref 34.0–46.6)
Hemoglobin: 13.2 g/dL (ref 11.1–15.9)
Immature Grans (Abs): 0.1 10*3/uL (ref 0.0–0.1)
Immature Granulocytes: 1 %
Lymphocytes Absolute: 3.3 10*3/uL — ABNORMAL HIGH (ref 0.7–3.1)
Lymphs: 32 %
MCH: 31.4 pg (ref 26.6–33.0)
MCHC: 33.4 g/dL (ref 31.5–35.7)
MCV: 94 fL (ref 79–97)
Monocytes Absolute: 0.5 10*3/uL (ref 0.1–0.9)
Monocytes: 5 %
Neutrophils Absolute: 6.1 10*3/uL (ref 1.4–7.0)
Neutrophils: 59 %
Platelets: 304 10*3/uL (ref 150–450)
RBC: 4.21 x10E6/uL (ref 3.77–5.28)
RDW: 12.4 % (ref 11.7–15.4)
WBC: 10.3 10*3/uL (ref 3.4–10.8)

## 2022-11-08 LAB — VITAMIN D 25 HYDROXY (VIT D DEFICIENCY, FRACTURES): Vit D, 25-Hydroxy: 17.4 ng/mL — ABNORMAL LOW (ref 30.0–100.0)

## 2022-11-08 LAB — LIPID PANEL
Chol/HDL Ratio: 3.9 ratio (ref 0.0–4.4)
Cholesterol, Total: 180 mg/dL (ref 100–199)
HDL: 46 mg/dL (ref >39)
LDL Chol Calc (NIH): 98 mg/dL (ref 0–99)
Triglycerides: 208 mg/dL — ABNORMAL HIGH (ref 0–149)
VLDL Cholesterol Cal: 36 mg/dL (ref 5–40)

## 2022-11-08 LAB — TSH+FREE T4
Free T4: 1.53 ng/dL (ref 0.82–1.77)
TSH: 3.69 u[IU]/mL (ref 0.450–4.500)

## 2022-11-08 LAB — HEMOGLOBIN A1C
Est. average glucose Bld gHb Est-mCnc: 169 mg/dL
Hgb A1c MFr Bld: 7.5 % — ABNORMAL HIGH (ref 4.8–5.6)

## 2022-11-11 ENCOUNTER — Encounter: Payer: Self-pay | Admitting: Internal Medicine

## 2022-11-11 ENCOUNTER — Other Ambulatory Visit: Payer: Self-pay | Admitting: Internal Medicine

## 2022-11-11 DIAGNOSIS — R7303 Prediabetes: Secondary | ICD-10-CM

## 2022-11-11 DIAGNOSIS — I10 Essential (primary) hypertension: Secondary | ICD-10-CM

## 2022-11-11 MED ORDER — METFORMIN HCL ER 500 MG PO TB24
500.0000 mg | ORAL_TABLET | Freq: Two times a day (BID) | ORAL | 1 refills | Status: DC
Start: 2022-11-11 — End: 2023-07-20

## 2022-11-11 MED ORDER — NEBIVOLOL HCL 20 MG PO TABS: 1.0000 | ORAL_TABLET | Freq: Every day | ORAL | 1 refills | Status: DC

## 2022-12-30 ENCOUNTER — Encounter: Payer: Self-pay | Admitting: Internal Medicine

## 2022-12-30 ENCOUNTER — Other Ambulatory Visit: Payer: Self-pay | Admitting: Internal Medicine

## 2022-12-30 DIAGNOSIS — M797 Fibromyalgia: Secondary | ICD-10-CM

## 2022-12-30 DIAGNOSIS — J3089 Other allergic rhinitis: Secondary | ICD-10-CM

## 2022-12-30 DIAGNOSIS — F5101 Primary insomnia: Secondary | ICD-10-CM

## 2022-12-30 MED ORDER — MONTELUKAST SODIUM 10 MG PO TABS
ORAL_TABLET | ORAL | 0 refills | Status: DC
Start: 2022-12-30 — End: 2023-04-07

## 2022-12-30 MED ORDER — DOXEPIN HCL 100 MG PO CAPS: ORAL_CAPSULE | ORAL | 0 refills | Status: DC

## 2022-12-30 MED ORDER — TRAMADOL HCL 50 MG PO TABS
50.0000 mg | ORAL_TABLET | Freq: Two times a day (BID) | ORAL | 0 refills | Status: DC | PRN
Start: 2022-12-30 — End: 2023-07-20

## 2023-03-31 ENCOUNTER — Ambulatory Visit: Payer: Medicare Other

## 2023-03-31 DIAGNOSIS — Z1211 Encounter for screening for malignant neoplasm of colon: Secondary | ICD-10-CM

## 2023-03-31 DIAGNOSIS — Z0000 Encounter for general adult medical examination without abnormal findings: Secondary | ICD-10-CM

## 2023-03-31 DIAGNOSIS — Z1231 Encounter for screening mammogram for malignant neoplasm of breast: Secondary | ICD-10-CM

## 2023-03-31 NOTE — Progress Notes (Signed)
Subjective:   [name] is a 63 y.o. female who presents for Medicare Annual (Subsequent) preventive examination.  Visit Complete: Virtual I connected with  [name] on 03/31/23 by a audio enabled telemedicine application and verified that I am speaking with the correct person using two identifiers.  Patient Location: Home  Provider Location: Office/Clinic  I discussed the limitations of evaluation and management by telemedicine. The patient expressed understanding and agreed to proceed.  Vital Signs: Because this visit was a virtual/telehealth visit, some criteria may be missing or patient reported. Any vitals not documented were not able to be obtained and vitals that have been documented are patient reported.  Cardiac Risk Factors include: advanced age (>12men, >63 women);obesity (BMI >30kg/m2);dyslipidemia;hypertension;sedentary lifestyle;diabetes mellitus     Objective:    Today's Vitals   03/31/23 1009  PainSc: 4    There is no height or weight on file to calculate BMI.     03/31/2023   10:18 AM 03/28/2022   10:01 AM 05/19/2019    9:33 AM 05/19/2019    9:25 AM 03/25/2019    2:54 PM 12/10/2018   12:33 PM 01/17/2016   10:48 AM  Advanced Directives  Does Patient Have a Medical Advance Directive? Yes Yes Yes No Yes Yes Yes  Type of Estate agent of [place];Living will Living will;Healthcare Power of [employer] Power of [employer] Power of Attorney Healthcare Power of Attorney   Does patient want to make changes to medical advance directive? No - Patient declined    No - Patient declined No - Patient declined   Copy of Healthcare Power of Attorney in Chart? Yes - validated most recent copy scanned in chart (See row information) No - copy requested   Yes - validated most recent copy scanned in chart (See row information) Yes - validated most recent copy scanned in chart (See row information)   Would patient like  information on creating a medical advance directive?    No - Patient declined       Current Medications (verified) Outpatient Encounter Medications as of 03/31/2023  Medication Sig   albuterol (VENTOLIN HFA) 108 (90 Base) MCG/ACT inhaler Inhale 2 puffs into the lungs 4 (four) times daily as needed.   aspirin 81 MG EC tablet Take 81 mg by mouth at bedtime.    blood glucose meter kit and supplies KIT Dispense based on patient and insurance preference. Use up to four times daily as directed. (FOR ICD-9 250.00, 250.01).   cetirizine (ZYRTEC) 10 MG tablet Take 10 mg by mouth every morning.    cyclobenzaprine (FLEXERIL) 10 MG tablet Take 1 tablet (10 mg total) by mouth 3 (three) times daily.   diphenhydrAMINE (BENADRYL) 25 MG tablet Take 25-50 mg by mouth every 6 (six) hours as needed (allergies/asthma).   doxazosin (CARDURA) 4 MG tablet Take 1 tablet (4 mg total) by mouth daily.   doxepin (SINEQUAN) 100 MG capsule TAKE 1 CAPSULE(100 MG) BY MOUTH AT BEDTIME   econazole nitrate 1 % cream Apply under breasts and in groin creases 1-2 times a day as needed.   esomeprazole (NEXIUM) 40 MG capsule TAKE 1 CAPSULE BY MOUTH EVERY DAY BEFORE SUPPER(5:OOPM)   FIBER, GUAR GUM, PO Take by mouth.   Fluocinolone Acetonide 0.01 % OIL Apply 1-2 drops to ear canals 1-2 times daily as needed for psoriasis.   fluticasone (FLONASE) 50 MCG/ACT nasal spray Place 2 sprays into both nostrils daily.   FREESTYLE LITE test  strip    lactobacillus acidophilus (BACID) TABS tablet Take 2 tablets by mouth daily.   Lancets (FREESTYLE) lancets 1 each 4 (four) times daily.   levothyroxine (SYNTHROID) 200 MCG tablet TAKE 1 TABLET(200 MCG) BY MOUTH DAILY   lisinopril (ZESTRIL) 40 MG tablet Take 1 tablet (40 mg total) by mouth daily.   loperamide (IMODIUM A-D) 2 MG tablet Take 2-4 mg by mouth 4 (four) times daily as needed for diarrhea or loose stools.    meloxicam (MOBIC) 15 MG tablet TAKE 1 TABLET(15 MG) BY MOUTH DAILY   metFORMIN  (GLUCOPHAGE-XR) 500 MG 24 hr tablet Take 1 tablet (500 mg total) by mouth 2 (two) times daily with a meal.   montelukast (SINGULAIR) 10 MG tablet TAKE 1 TABLET BY MOUTH EVERY NIGHT AT BEDTIME   Multiple Vitamins-Minerals (MULTIVITAMIN WOMEN 50+ PO) Take by mouth.   Nebivolol HCl 20 MG TABS Take 1 tablet (20 mg total) by mouth daily.   OVER THE COUNTER MEDICATION Woman's multvitamin-Take 1 gummie daily.   tamoxifen (NOLVADEX) 20 MG tablet Take 1 tablet (20 mg total) by mouth daily.   traMADol (ULTRAM) 50 MG tablet Take 1 tablet (50 mg total) by mouth every 12 (twelve) hours as needed.   traZODone (DESYREL) 50 MG tablet Take 2 tablets (100 mg total) by mouth at bedtime.   vitamin B-12 (CYANOCOBALAMIN) 1000 MCG tablet Take 1500 mcg-2 gummie daily.   No facility-administered encounter medications on file as of 03/31/2023.    Allergies (verified) Sulfa antibiotics, Amlodipine, Furosemide, Lyrica [pregabalin], and Hydrochlorothiazide   History: Past Medical History:  Diagnosis Date   Allergic rhinitis    Anemia    Arthritis    Asthma    well controlled   Basal cell carcinoma 07/1997   Treated by Dr Jarold Motto   BRCA2 gene mutation positive 12/2018   MyRisk BRCA 2 positive with special interpretation--mutation has less penetrance than usual BRCA 2 mutation   Bursitis    Complication of anesthesia    thrashing and screaming after cholecystectomy   Dermatitis, eczematoid 09/30/2011   Duodenal ulcer    Family history of breast cancer    Fibromyalgia    Gastritis    GERD (gastroesophageal reflux disease)    Headache    h/o migraines   Heart murmur    asymptomatic   Hiatal hernia    History of kidney stones    h/o   Hypertension    Hypothyroidism    IBS (irritable bowel syndrome)    OSA (obstructive sleep apnea)    has CPAP-not using currently due to needing new mask as of 05-13-19   Pneumonia 2009   Psoriasis    Rheumatic fever    Past Surgical History:  Procedure Laterality  Date   APPENDECTOMY     BREAST BIOPSY Left 2007   benign, dr Lemar Livings   CHOLECYSTECTOMY     CYSTOSCOPY  05/19/2019   Procedure: CYSTOSCOPY;  Surgeon: Natale Milch, MD;  Location: [facility] ORS;  Service: Gynecology;;   HYSTEROSCOPY WITH D & C N/A 12/14/2018   Procedure: DILATATION AND CURETTAGE /HYSTEROSCOPY WITH MYOSURE, VULVAR BIOPSY;  Surgeon: Natale Milch, MD;  Location: [facility] ORS;  Service: Gynecology;  Laterality: N/A;   SKIN BIOPSY Left    Back on L leg    SKIN CANCER EXCISION     BCCA of face   TONSILLECTOMY AND ADENOIDECTOMY     TOTAL LAPAROSCOPIC HYSTERECTOMY WITH SALPINGECTOMY N/A 05/19/2019   Procedure: TOTAL LAPAROSCOPIC HYSTERECTOMY WITH SALPINGECTOMY/ TLH/BSO;  Surgeon: Natale Milch, MD;  Location: [facility] ORS;  Service: Gynecology;  Laterality: N/A;   Family History  Problem Relation Age of Onset   Breast cancer Mother 20   CAD Father    Heart attack Father    Bladder Cancer Father 36   Breast cancer Paternal Grandmother 73   Leukemia Paternal Grandmother 23   Diabetes Other        multiple family members   Breast cancer Maternal Aunt 46   Social History   Socioeconomic History   Marital status: Married    Spouse name: Not on file   Number of children: Not on file   Years of education: Not on file   Highest education level: Not on file  Occupational History   Occupation: disabled  Tobacco Use   Smoking status: Never   Smokeless tobacco: Never  Vaping Use   Vaping status: Never Used  Substance and Sexual Activity   Alcohol use: Yes    Alcohol/week: 2.0 standard drinks of alcohol    Types: 2 Standard drinks or equivalent per week    Comment: rarely   Drug use: No   Sexual activity: Yes    Birth control/protection: Post-menopausal  Other Topics Concern   Not on file  Social History Narrative   Not on file   Social Determinants of Health   Financial Resource Strain: Low Risk  (03/31/2023)   Overall Financial Resource Strain  (CARDIA)    Difficulty of Paying Living Expenses: Not hard at all  Food Insecurity: No Food Insecurity (03/31/2023)   Hunger Vital Sign    Worried About Running Out of Food in the Last Year: Never true    Ran Out of Food in the Last Year: Never true  Transportation Needs: No Transportation Needs (03/31/2023)   PRAPARE - Administrator, Civil Service (Medical): No    Lack of Transportation (Non-Medical): No  Physical Activity: Inactive (03/31/2023)   Exercise Vital Sign    Days of Exercise per Week: 0 days    Minutes of Exercise per Session: 0 min  Stress: Stress Concern Present (03/31/2023)   [employer] of Occupational Health - Occupational Stress Questionnaire    Feeling of Stress : To some extent  Social Connections: Moderately Isolated (03/31/2023)   Social Connection and Isolation Panel [NHANES]    Frequency of Communication with Friends and Family: More than three times a week    Frequency of Social Gatherings with Friends and Family: Never    Attends Religious Services: Never    Database administrator or Organizations: No    Attends Engineer, structural: Never    Marital Status: Married    Tobacco Counseling Counseling given: Not Answered   Clinical Intake:  Pre-visit preparation completed: Yes  Pain : 0-10 Pain Score: 4  Pain Type: Chronic pain Pain Location: Hip Pain Orientation: Left Pain Descriptors / Indicators: Aching, Constant, Cramping Pain Onset: More than a month ago Pain Frequency: Constant     Nutritional Status: BMI > 30  Obese Nutritional Risks: None Diabetes: Yes CBG done?: No Did pt. bring in CBG monitor from home?: No  How often do you need to have someone help you when you read instructions, pamphlets, or other written materials from your doctor or pharmacy?: 1 - Never  Interpreter Needed?: No  Information entered by :: Kennedy Bucker, LPN   Activities of Daily Living    03/31/2023   10:19 AM  In your  present state  of health, do you have any difficulty performing the following activities:  Hearing? 0  Vision? 0  Difficulty concentrating or making decisions? 0  Walking or climbing stairs? 1  Comment gout  Dressing or bathing? 0  Doing errands, shopping? 0  Preparing Food and eating ? N  Using the Toilet? N  In the past six months, have you accidently leaked urine? N  Do you have problems with loss of bowel control? N  Managing your Medications? N  Managing your Finances? N  Housekeeping or managing your Housekeeping? N    Patient Care Team: Reubin Milan, MD as PCP - General (Internal Medicine) Deirdre Evener, MD (Dermatology) Natale Milch, MD as Consulting Physician (Obstetrics and Gynecology)  Indicate any recent Medical Services you may have received from other than [facility] providers in the past year (date may be approximate).     Assessment:   This is a routine wellness examination for [name].  Hearing/Vision screen Hearing Screening - Comments:: No aids- is HOH Vision Screening - Comments:: Wears glasses- [name]   Goals Addressed             This Visit's Progress    DIET - EAT MORE FRUITS AND VEGETABLES         Depression Screen    03/31/2023   10:16 AM 11/07/2022    2:43 PM 03/28/2022    9:51 AM 09/09/2021    1:27 PM 05/20/2021    1:33 PM 01/09/2021   10:09 AM 05/09/2020   10:00 AM  PHQ 2/9 Scores  PHQ - 2 Score 0 0 1 0 0 1 0  PHQ- 9 Score 0 8 9 6 3 7 3     Fall Risk    03/31/2023   10:19 AM 11/07/2022    2:43 PM 03/28/2022    9:52 AM 09/09/2021    1:27 PM 05/20/2021    1:33 PM  Fall Risk   Falls in the past year? 0 0 1 1 1   Number falls in past yr: 0 0 1 1 1   Injury with Fall? 0 0 0 1 1  Risk for fall due to : No Fall Risks No Fall Risks History of fall(s) History of fall(s) History of fall(s)  Follow up Falls prevention discussed;Falls evaluation completed Falls evaluation completed Falls evaluation completed Falls evaluation  completed Falls evaluation completed    MEDICARE RISK AT HOME: Medicare Risk at Home Any stairs in or around the home?: Yes If so, are there any without handrails?: No Home free of loose throw rugs in walkways, pet beds, electrical cords, etc?: Yes Adequate lighting in your home to reduce risk of falls?: Yes Life alert?: No Use of a cane, walker or w/c?: Yes (cane) Grab bars in the bathroom?: No Shower chair or bench in shower?: No Elevated toilet seat or a handicapped toilet?: No  TIMED UP AND GO:  Was the test performed?  No    Cognitive Function:        03/31/2023   10:21 AM 03/28/2022   10:02 AM  6CIT Screen  What Year? 0 points 0 points  What month? 0 points 0 points  What time? 0 points 0 points  Count back from 20 0 points 0 points  Months in reverse 0 points 0 points  Repeat phrase 0 points 4 points  Total Score 0 points 4 points    Immunizations Immunization History  Administered Date(s) Administered   Influenza,inj,Quad PF,6+ Mos 03/19/2015, 03/02/2017, 07/19/2018, 05/13/2019, 05/09/2020,  05/20/2021, 03/28/2022   PFIZER Comirnaty(Gray Top)Covid-19 Tri-Sucrose Vaccine 09/27/2020   PFIZER(Purple Top)SARS-COV-2 Vaccination 09/02/2019, 09/28/2019, 09/07/2020   Pneumococcal Conjugate-13 01/17/2016   Pneumococcal Polysaccharide-23 06/10/2010   Tdap 12/29/2021   Zoster Recombinant(Shingrix) 11/21/2019, 02/15/2020    TDAP status: Up to date  Flu Vaccine status: Due, Education has been provided regarding the importance of this vaccine. Advised may receive this vaccine at local pharmacy or Health Dept. Aware to provide a copy of the vaccination record if obtained from local pharmacy or Health Dept. Verbalized acceptance and understanding.  Pneumococcal vaccine status: Up to date  Covid-19 vaccine status: Completed vaccines  Qualifies for Shingles Vaccine? Yes   Zostavax completed No   Shingrix Completed?: Yes  Screening Tests Health Maintenance  Topic  Date Due   HIV Screening  Never done   MAMMOGRAM  01/04/2022   Colonoscopy  06/10/2022   INFLUENZA VACCINE  01/08/2023   COVID-19 Vaccine (5 - 2023-24 season) 02/08/2023   Medicare Annual Wellness (AWV)  03/30/2024   DTaP/Tdap/Td (2 - Td or Tdap) 12/30/2031   Zoster Vaccines- Shingrix  Completed   Hepatitis C Screening  Addressed   Pneumococcal Vaccine 56-41 Years old  Aged Out   HPV VACCINES  Aged Out    Health Maintenance  Health Maintenance Due  Topic Date Due   HIV Screening  Never done   MAMMOGRAM  01/04/2022   Colonoscopy  06/10/2022   INFLUENZA VACCINE  01/08/2023   COVID-19 Vaccine (5 - 2023-24 season) 02/08/2023    Colorectal cancer screening: Referral to GI placed 03/31/23. Pt aware the office will call re: appt.  Mammogram status: Ordered 03/31/23. Pt provided with contact info and advised to call to schedule appt.    Lung Cancer Screening: (Low Dose CT Chest recommended if Age 64-80 years, 20 pack-year currently smoking OR have quit w/in 15years.) does not qualify.    Additional Screening:  Hepatitis C Screening: does qualify; Completed 01/17/16  Vision Screening: Recommended annual ophthalmology exams for early detection of glaucoma and other disorders of the eye. Is the patient up to date with their annual eye exam?  Yes  Who is the provider or what is the name of the office in which the patient attends annual eye exams? Dr.Brasington If pt is not established with a provider, would they like to be referred to a provider to establish care? No .   Dental Screening: Recommended annual dental exams for proper oral hygiene  Diabetic Foot Exam: Diabetic Foot Exam: Overdue, Pt has been advised about the importance in completing this exam. Pt is scheduled for diabetic foot exam on 00.  Community Resource Referral / Chronic Care Management: CRR required this visit?  No   CCM required this visit?  No     Plan:     I have personally reviewed and noted the  following in the patient's chart:   Medical and social history Use of alcohol, tobacco or illicit drugs  Current medications and supplements including opioid prescriptions. Patient is not currently taking opioid prescriptions. Functional ability and status Nutritional status Physical activity Advanced directives List of other physicians Hospitalizations, surgeries, and ER visits in previous 12 months Vitals Screenings to include cognitive, depression, and falls Referrals and appointments  In addition, I have reviewed and discussed with patient certain preventive protocols, quality metrics, and best practice recommendations. A written personalized care plan for preventive services as well as general preventive health recommendations were provided to patient.     Hal Hope, LPN  03/31/2023   After Visit Summary: (MyChart) Due to this being a telephonic visit, the after visit summary with patients personalized plan was offered to patient via MyChart   Nurse Notes: none

## 2023-03-31 NOTE — Patient Instructions (Addendum)
[name] , Thank you for taking time to come for your Medicare Wellness Visit. I appreciate your ongoing commitment to your health goals. Please review the following plan we discussed and let me know if I can assist you in the future.   Referrals/Orders/Follow-Ups/Clinician Recommendations: none  This is a list of the screening recommended for you and due dates:  Health Maintenance  Topic Date Due   HIV Screening  Never done   Mammogram  01/04/2022   Colon Cancer Screening  06/10/2022   Flu Shot  01/08/2023   COVID-19 Vaccine (5 - 2023-24 season) 02/08/2023   Medicare Annual Wellness Visit  03/30/2024   DTaP/Tdap/Td vaccine (2 - Td or Tdap) 12/30/2031   Zoster (Shingles) Vaccine  Completed   Hepatitis C Screening  Addressed   Pneumococcal Vaccination  Aged Out   HPV Vaccine  Aged Out    Advanced directives: (In Chart) A copy of your advanced directives are scanned into your chart should your provider ever need it.  Next Medicare Annual Wellness Visit scheduled for next year: Yes   04/06/24 @ 9:30 am by video

## 2023-04-07 ENCOUNTER — Other Ambulatory Visit: Payer: Self-pay

## 2023-04-07 DIAGNOSIS — F5101 Primary insomnia: Secondary | ICD-10-CM

## 2023-04-07 DIAGNOSIS — J3089 Other allergic rhinitis: Secondary | ICD-10-CM

## 2023-04-07 MED ORDER — DOXEPIN HCL 100 MG PO CAPS
ORAL_CAPSULE | ORAL | 0 refills | Status: DC
Start: 2023-04-07 — End: 2023-07-20

## 2023-04-07 MED ORDER — MONTELUKAST SODIUM 10 MG PO TABS
ORAL_TABLET | ORAL | 0 refills | Status: DC
Start: 2023-04-07 — End: 2023-04-08

## 2023-04-08 ENCOUNTER — Other Ambulatory Visit: Payer: Self-pay

## 2023-04-08 DIAGNOSIS — I10 Essential (primary) hypertension: Secondary | ICD-10-CM

## 2023-04-08 DIAGNOSIS — J3089 Other allergic rhinitis: Secondary | ICD-10-CM

## 2023-04-08 MED ORDER — DOXAZOSIN MESYLATE 4 MG PO TABS: 4.0000 mg | ORAL_TABLET | Freq: Every day | ORAL | 0 refills | Status: DC

## 2023-04-08 MED ORDER — MONTELUKAST SODIUM 10 MG PO TABS: ORAL_TABLET | ORAL | 0 refills | Status: DC

## 2023-05-11 ENCOUNTER — Ambulatory Visit: Payer: Medicare Other | Admitting: Internal Medicine

## 2023-06-05 ENCOUNTER — Encounter: Payer: Self-pay | Admitting: Internal Medicine

## 2023-06-05 ENCOUNTER — Other Ambulatory Visit: Payer: Self-pay | Admitting: Internal Medicine

## 2023-06-05 DIAGNOSIS — M797 Fibromyalgia: Secondary | ICD-10-CM

## 2023-06-05 DIAGNOSIS — I10 Essential (primary) hypertension: Secondary | ICD-10-CM

## 2023-06-05 DIAGNOSIS — E039 Hypothyroidism, unspecified: Secondary | ICD-10-CM

## 2023-06-05 DIAGNOSIS — J3089 Other allergic rhinitis: Secondary | ICD-10-CM

## 2023-06-05 MED ORDER — LISINOPRIL 40 MG PO TABS: 40.0000 mg | ORAL_TABLET | Freq: Every day | ORAL | 1 refills | Status: DC

## 2023-06-05 MED ORDER — MONTELUKAST SODIUM 10 MG PO TABS: ORAL_TABLET | ORAL | 1 refills | Status: DC

## 2023-06-05 MED ORDER — MELOXICAM 15 MG PO TABS: ORAL_TABLET | ORAL | 1 refills | Status: DC

## 2023-06-05 MED ORDER — LEVOTHYROXINE SODIUM 200 MCG PO TABS: ORAL_TABLET | ORAL | 1 refills | Status: DC

## 2023-06-11 NOTE — Telephone Encounter (Signed)
 Requested medication (s) are due for refill today:   No if on Synthroid  200 mcg  Requested medication (s) are on the active medication list:   Synthroid  200 mcg is on list but not the others the pharmacy is asking about.  Future visit scheduled:   Yes 07/20/2023   Last ordered: 06/05/2023 #90, 1 refill of the Synthroid  200 mcg  Returned for provider's review.    See pharmacy message   Requested Prescriptions  Pending Prescriptions Disp Refills   levothyroxine  (SYNTHROID ) 200 MCG tablet [Pharmacy Med Name: Levothyroxine  Sodium 200mcg Tablet] 90 tablet 1    Sig: Take 1 tablet by mouth daily.     Endocrinology:  Hypothyroid Agents Passed - 06/11/2023  8:31 AM      Passed - TSH in normal range and within 360 days    TSH  Date Value Ref Range Status  11/07/2022 3.690 0.450 - 4.500 uIU/mL Final         Passed - Valid encounter within last 12 months    Recent Outpatient Visits           7 months ago Essential (primary) hypertension   [place] [facility] at [facility], Leita DEL, MD   1 year ago Essential (primary) hypertension   [place] [facility] at [facility], Leita DEL, MD   2 years ago Essential (primary) hypertension   [place] [facility] at [facility], Leita DEL, MD   2 years ago Essential (primary) hypertension   [place] [facility] at [facility], Leita DEL, MD   3 years ago Essential (primary) hypertension   [place] [facility] at [facility], Leita DEL, MD       Future Appointments             In 1 month Just[place], Leita DEL, MD [facility] Primary Care & Sports Medicine at [facility], [facility]

## 2023-06-11 NOTE — Telephone Encounter (Signed)
 Please review.  KP

## 2023-07-08 ENCOUNTER — Other Ambulatory Visit: Payer: Self-pay | Admitting: Internal Medicine

## 2023-07-08 DIAGNOSIS — F5101 Primary insomnia: Secondary | ICD-10-CM

## 2023-07-09 NOTE — Telephone Encounter (Signed)
Requested medications are due for refill today.  yes  Requested medications are on the active medications list.  yes  Last refill. 04/07/2023 #90 0 rf  Future visit scheduled.   yes  Notes to clinic.  Pharmacy is in [place]. Computer is giving warning regarding this dosage. Please review for refill.    Requested Prescriptions  Pending Prescriptions Disp Refills   doxepin (SINEQUAN) 100 MG capsule [Pharmacy Med Name: DOXEPIN 100 MG CAPSULE] 90 capsule 0    Sig: TAKE 1 CAPSULE BY MOUTH ONCE DAILY AT BEDTIME     Psychiatry:  Antidepressants - Heterocyclics (TCAs) Passed - 07/09/2023  1:42 PM      Passed - Valid encounter within last 6 months    Recent Outpatient Visits           8 months ago Essential (primary) hypertension   [place] [facility] Nyoka Cowden, MD   1 year ago Essential (primary) hypertension   [place] [facility] at [facility], Nyoka Cowden, MD   2 years ago Essential (primary) hypertension   [place] [facility] at [facility], Nyoka Cowden, MD   2 years ago Essential (primary) hypertension   [place] [facility] at [facility], Nyoka Cowden, MD   3 years ago Essential (primary) hypertension   [place] [facility] at [facility], Nyoka Cowden, MD       Future Appointments             In 1 week Judithann Graves Nyoka Cowden, MD [facility] Medicine at [facility], [facility], [facility]           Psychiatry:  Antidepressants - Heterocyclics (TCAs) - doxepin Passed - 07/09/2023  1:42 PM      Passed - Valid encounter within last 12 months    Recent Outpatient Visits           8 months ago Essential (primary) hypertension   [place] [facility] at [facility], Nyoka Cowden, MD   1 year ago Essential (primary) hypertension   [place] [facility] at [facility], Nyoka Cowden, MD   2 years ago Essential (primary) hypertension   [place] [facility] at [facility], Nyoka Cowden, MD   2 years ago Essential (primary) hypertension   [place] [facility] at [facility], Nyoka Cowden, MD   3 years ago Essential (primary) hypertension   [place] [facility] at [facility], Nyoka Cowden, MD       Future Appointments             In 1 week Judithann Graves, Nyoka Cowden, MD [facility] Primary Care & Sports Medicine at [facility], [facility]

## 2023-07-13 ENCOUNTER — Other Ambulatory Visit: Payer: Self-pay | Admitting: Internal Medicine

## 2023-07-13 DIAGNOSIS — I10 Essential (primary) hypertension: Secondary | ICD-10-CM

## 2023-07-14 NOTE — Telephone Encounter (Signed)
 Appointment scheduled 07/20/23- courtesy 30 day Rx given Requested Prescriptions  Pending Prescriptions Disp Refills   doxazosin  (CARDURA ) 4 MG tablet [Pharmacy Med Name: DOXAZOSIN  4MG  TABLETS] 90 tablet 0    Sig: TAKE 1 TABLET(4 MG) BY MOUTH DAILY     Cardiovascular:  Alpha Blockers Passed - 07/14/2023  2:09 PM      Passed - Last BP in normal range    BP Readings from Last 1 Encounters:  11/07/22 128/76         Passed - Valid encounter within last 6 months    Recent Outpatient Visits           8 months ago Essential (primary) hypertension   [place] [facility] at [facility], Leita DEL, MD   1 year ago Essential (primary) hypertension   [place] [facility] at [facility], Leita DEL, MD   2 years ago Essential (primary) hypertension   [place] [facility] at [facility], Leita DEL, MD   2 years ago Essential (primary) hypertension   [place] [facility] at [facility], Leita DEL, MD   3 years ago Essential (primary) hypertension   [place] [facility] at [facility], Leita DEL, MD       Future Appointments             In 6 days [name], MD [facility] Primary Care & Sports Medicine at [facility], [facility]

## 2023-07-20 ENCOUNTER — Encounter: Payer: Self-pay | Admitting: Internal Medicine

## 2023-07-20 ENCOUNTER — Ambulatory Visit (INDEPENDENT_AMBULATORY_CARE_PROVIDER_SITE_OTHER): Payer: Medicare Other | Admitting: Internal Medicine

## 2023-07-20 ENCOUNTER — Other Ambulatory Visit: Payer: Self-pay | Admitting: Internal Medicine

## 2023-07-20 VITALS — BP 128/78 | HR 83 | Temp 97.9°F | Ht 61.0 in | Wt 227.0 lb

## 2023-07-20 DIAGNOSIS — E119 Type 2 diabetes mellitus without complications: Secondary | ICD-10-CM | POA: Diagnosis not present

## 2023-07-20 DIAGNOSIS — I10 Essential (primary) hypertension: Secondary | ICD-10-CM

## 2023-07-20 DIAGNOSIS — F5101 Primary insomnia: Secondary | ICD-10-CM

## 2023-07-20 DIAGNOSIS — E782 Mixed hyperlipidemia: Secondary | ICD-10-CM

## 2023-07-20 DIAGNOSIS — E118 Type 2 diabetes mellitus with unspecified complications: Secondary | ICD-10-CM | POA: Diagnosis not present

## 2023-07-20 DIAGNOSIS — K277 Chronic peptic ulcer, site unspecified, without hemorrhage or perforation: Secondary | ICD-10-CM

## 2023-07-20 DIAGNOSIS — H2513 Age-related nuclear cataract, bilateral: Secondary | ICD-10-CM | POA: Diagnosis not present

## 2023-07-20 DIAGNOSIS — H25041 Posterior subcapsular polar age-related cataract, right eye: Secondary | ICD-10-CM | POA: Diagnosis not present

## 2023-07-20 DIAGNOSIS — M797 Fibromyalgia: Secondary | ICD-10-CM

## 2023-07-20 DIAGNOSIS — J3089 Other allergic rhinitis: Secondary | ICD-10-CM

## 2023-07-20 DIAGNOSIS — E039 Hypothyroidism, unspecified: Secondary | ICD-10-CM | POA: Diagnosis not present

## 2023-07-20 DIAGNOSIS — Z1501 Genetic susceptibility to malignant neoplasm of breast: Secondary | ICD-10-CM

## 2023-07-20 DIAGNOSIS — Z1509 Genetic susceptibility to other malignant neoplasm: Secondary | ICD-10-CM

## 2023-07-20 DIAGNOSIS — Z7984 Long term (current) use of oral hypoglycemic drugs: Secondary | ICD-10-CM

## 2023-07-20 DIAGNOSIS — J029 Acute pharyngitis, unspecified: Secondary | ICD-10-CM

## 2023-07-20 DIAGNOSIS — Z0100 Encounter for examination of eyes and vision without abnormal findings: Secondary | ICD-10-CM | POA: Diagnosis not present

## 2023-07-20 LAB — HM DIABETES EYE EXAM

## 2023-07-20 MED ORDER — AZITHROMYCIN 250 MG PO TABS: ORAL_TABLET | ORAL | 0 refills | Status: AC

## 2023-07-20 MED ORDER — ESOMEPRAZOLE MAGNESIUM 40 MG PO CPDR: DELAYED_RELEASE_CAPSULE | ORAL | 1 refills | Status: DC

## 2023-07-20 MED ORDER — BLOOD GLUCOSE TEST VI STRP: 1.0000 | ORAL_STRIP | Freq: Three times a day (TID) | 0 refills | Status: AC

## 2023-07-20 MED ORDER — BLOOD GLUCOSE MONITORING SUPPL DEVI: 1.0000 | Freq: Three times a day (TID) | 0 refills | Status: AC

## 2023-07-20 MED ORDER — LANCET DEVICE MISC: 1.0000 | Freq: Three times a day (TID) | 0 refills | Status: AC

## 2023-07-20 MED ORDER — TRAZODONE HCL 50 MG PO TABS: 100.0000 mg | ORAL_TABLET | Freq: Every day | ORAL | 0 refills | Status: DC

## 2023-07-20 MED ORDER — MONTELUKAST SODIUM 10 MG PO TABS: ORAL_TABLET | ORAL | 0 refills | Status: DC

## 2023-07-20 MED ORDER — LANCETS MISC. MISC: 1.0000 | Freq: Three times a day (TID) | 0 refills | Status: AC

## 2023-07-20 MED ORDER — TRAMADOL HCL 50 MG PO TABS: 50.0000 mg | ORAL_TABLET | Freq: Two times a day (BID) | ORAL | 0 refills | Status: DC | PRN

## 2023-07-20 MED ORDER — METFORMIN HCL ER 500 MG PO TB24: 500.0000 mg | ORAL_TABLET | Freq: Two times a day (BID) | ORAL | 1 refills | Status: DC

## 2023-07-20 MED ORDER — DOXEPIN HCL 100 MG PO CAPS: ORAL_CAPSULE | ORAL | 0 refills | Status: DC

## 2023-07-20 MED ORDER — DOXAZOSIN MESYLATE 4 MG PO TABS: 4.0000 mg | ORAL_TABLET | Freq: Every day | ORAL | 1 refills | Status: DC

## 2023-07-20 MED ORDER — CYCLOBENZAPRINE HCL 10 MG PO TABS
10.0000 mg | ORAL_TABLET | Freq: Three times a day (TID) | ORAL | 1 refills | Status: DC
Start: 2023-07-20 — End: 2024-02-09

## 2023-07-20 MED ORDER — TAMOXIFEN CITRATE 20 MG PO TABS: 20.0000 mg | ORAL_TABLET | Freq: Every day | ORAL | 1 refills | Status: DC

## 2023-07-20 NOTE — Assessment & Plan Note (Signed)
 Controlled BP with normal exam. Current regimen is cardura , lisinopril  and nebivolol . Will continue same medications; encourage continued reduced sodium diet.

## 2023-07-20 NOTE — Assessment & Plan Note (Signed)
 Started on metformin  last visit.  No SE noted. Needs new meter to monitor BS Still living in [place] and going back tomorrow

## 2023-07-20 NOTE — Progress Notes (Unsigned)
 Date:  07/20/2023   Name:  [name]   DOB:  03/19/1961   MRN:  [id number]   Chief Complaint: No chief complaint on file.  HPI  Review of Systems   Lab Results  Component Value Date   NA 140 11/07/2022   K 4.5 11/07/2022   CO2 23 11/07/2022   GLUCOSE 141 (H) 11/07/2022   BUN 11 11/07/2022   CREATININE 0.67 11/07/2022   CALCIUM 9.1 11/07/2022   EGFR 98 11/07/2022   GFRNONAA 104 11/21/2019   Lab Results  Component Value Date   CHOL 180 11/07/2022   HDL 46 11/07/2022   LDLCALC 98 11/07/2022   TRIG 208 (H) 11/07/2022   CHOLHDL 3.9 11/07/2022   Lab Results  Component Value Date   TSH 3.690 11/07/2022   Lab Results  Component Value Date   HGBA1C 7.5 (H) 11/07/2022   Lab Results  Component Value Date   WBC 10.3 11/07/2022   HGB 13.2 11/07/2022   HCT 39.5 11/07/2022   MCV 94 11/07/2022   PLT 304 11/07/2022   Lab Results  Component Value Date   ALT 18 11/07/2022   AST 38 11/07/2022   ALKPHOS 103 11/07/2022   BILITOT 0.3 11/07/2022   Lab Results  Component Value Date   VD25OH 17.4 (L) 11/07/2022     Patient Active Problem List   Diagnosis Date Noted   Flexor tenosynovitis of finger 01/17/2022   S/P hysterectomy with oophorectomy 05/19/2019   BRCA2 gene mutation positive 03/03/2019   Pre-diabetes 09/16/2016   Insomnia 09/16/2016   Arthritis of carpometacarpal (CMC) joint of thumb 05/29/2016   Venous insufficiency of both lower extremities 01/17/2016   Acquired hypothyroidism 11/10/2014   Essential (primary) hypertension 11/10/2014   Fibromyalgia syndrome 11/10/2014   Mixed hyperlipidemia 11/10/2014   Irritable bowel syndrome with diarrhea 11/10/2014   Asthma, mild intermittent 11/10/2014   BMI 40.0-44.9, adult (HCC) 11/10/2014   Chronic peptic ulcer 11/10/2014   Psoriasis 11/10/2014   Obstructive apnea 11/10/2014   Chest pain 07/19/2012   Environmental and seasonal allergies 09/08/2011    Allergies  Allergen Reactions   Sulfa  Antibiotics Other (See Comments)    seizure   Amlodipine Swelling    Leg swelling   Furosemide Swelling   Lyrica [Pregabalin] Other (See Comments)    Dizziness and blurred vision   Hydrochlorothiazide Itching and Rash    Past Surgical History:  Procedure Laterality Date   APPENDECTOMY     BREAST BIOPSY Left 2007   benign, dr Marquita Situ   CHOLECYSTECTOMY     CYSTOSCOPY  05/19/2019   Procedure: CYSTOSCOPY;  Surgeon: Heron Lord, MD;  Location: [facility];  Service: Gynecology;;   HYSTEROSCOPY WITH D & C N/A 12/14/2018   Procedure: DILATATION AND CURETTAGE /HYSTEROSCOPY WITH MYOSURE, VULVAR BIOPSY;  Surgeon: Heron Lord, MD;  Location: [facility];  Service: Gynecology;  Laterality: N/A;   SKIN BIOPSY Left    Back on L leg    SKIN CANCER EXCISION     BCCA of face   TONSILLECTOMY AND ADENOIDECTOMY     TOTAL LAPAROSCOPIC HYSTERECTOMY WITH SALPINGECTOMY N/A 05/19/2019   Procedure: TOTAL LAPAROSCOPIC HYSTERECTOMY WITH SALPINGECTOMY/ TLH/BSO;  Surgeon: Heron Lord, MD;  Location: [facility];  Service: Gynecology;  Laterality: N/A;    Social History   Tobacco Use   Smoking status: Never   Smokeless tobacco: Never  Vaping Use   Vaping status: Never Used  Substance Use Topics   Alcohol use:  Yes    Alcohol/week: 2.0 standard drinks of alcohol    Types: 2 Standard drinks or equivalent per week    Comment: rarely   Drug use: No     Medication list has been reviewed and updated.  No outpatient medications have been marked as taking for the 07/20/23 encounter (Orders Only) with Sheron Dixons, MD.       07/20/2023    3:03 PM 11/07/2022    2:44 PM 09/09/2021    1:27 PM 05/20/2021    1:34 PM  GAD 7 : Generalized Anxiety Score  Nervous, Anxious, on Edge 0 0 0 0  Control/stop worrying 0 0 0 0  Worry too much - different things 0 0 1 0  Trouble relaxing 0 0 0 0  Restless 0 0 0 0  Easily annoyed or irritable 2 0 1 1  Afraid - awful might happen 0 0 0 0  Total  GAD 7 Score 2 0 2 1  Anxiety Difficulty Not difficult at all Not difficult at all  Not difficult at all       07/20/2023    3:03 PM 03/31/2023   10:16 AM 11/07/2022    2:43 PM  Depression screen PHQ 2/9  Decreased Interest 2 0 0  Down, Depressed, Hopeless 0 0 0  PHQ - 2 Score 2 0 0  Altered sleeping 3 0 3  Tired, decreased energy 2 0 3  Change in appetite 1 0 2  Feeling bad or failure about yourself  0 0 0  Trouble concentrating 1 0 0  Moving slowly or fidgety/restless 0 0 0  Suicidal thoughts 0 0 0  PHQ-9 Score 9 0 8  Difficult doing work/chores Somewhat difficult Not difficult at all Somewhat difficult    BP Readings from Last 3 Encounters:  07/20/23 128/78  11/07/22 128/76  03/28/22 128/70    Physical Exam  Wt Readings from Last 3 Encounters:  07/20/23 227 lb (103 kg)  11/07/22 227 lb (103 kg)  03/28/22 233 lb 9.6 oz (106 kg)    There were no vitals taken for this visit.  Assessment and Plan:  Problem List Items Addressed This Visit   None   No follow-ups on file.    Sheron Dixons, MD [facility] [place]

## 2023-07-20 NOTE — Progress Notes (Signed)
 Date:  07/20/2023   Name:  [name]   DOB:  Nov 14, 1960   MRN:  [id number]   Chief Complaint: Hypertension, Diabetes, and Cough (Cough X 1 week. Getting better with Mucinex DM. Sore throat is not better. Covid Negative. )  Hypertension This is a chronic problem. The problem is controlled. Pertinent negatives include no chest pain, headaches, palpitations or shortness of breath. Identifiable causes of hypertension include a thyroid  problem.  Diabetes She presents for her follow-up diabetic visit. She has type 2 diabetes mellitus. The initial diagnosis of diabetes was made 6 months (6) ago. Her disease course has been stable. Pertinent negatives for hypoglycemia include no headaches, nervousness/anxiousness or tremors. Pertinent negatives for diabetes include no chest pain, no fatigue, no polydipsia and no polyuria. An ACE inhibitor/angiotensin II receptor blocker is being taken.  Thyroid  Problem Presents for follow-up visit. Patient reports no anxiety, fatigue, palpitations or tremors. Her past medical history is significant for hyperlipidemia.  URI  This is a new problem. The current episode started in the past 7 days. There has been no fever. Associated symptoms include congestion, coughing and a sore throat. Pertinent negatives include no abdominal pain, chest pain, dysuria or headaches.  Hyperlipidemia This is a chronic problem. The problem is uncontrolled. Pertinent negatives include no chest pain or shortness of breath. She is currently on no antihyperlipidemic treatment.  Gastroesophageal Reflux She complains of coughing, heartburn and a sore throat. She reports no abdominal pain or no chest pain. This is a recurrent problem. Pertinent negatives include no fatigue. She has tried a PPI for the symptoms. The treatment provided significant relief.  Fibromyalgia - on Trazodone  and prn Tramadol .  Review of Systems  Constitutional:  Negative for appetite change, fatigue, fever and  unexpected weight change.  HENT:  Positive for congestion and sore throat. Negative for tinnitus and trouble swallowing.   Eyes:  Negative for visual disturbance.  Respiratory:  Positive for cough. Negative for chest tightness and shortness of breath.   Cardiovascular:  Negative for chest pain, palpitations and leg swelling.  Gastrointestinal:  Positive for heartburn. Negative for abdominal pain.  Endocrine: Negative for polydipsia and polyuria.  Genitourinary:  Negative for dysuria and hematuria.  Musculoskeletal:  Negative for arthralgias.  Neurological:  Negative for tremors, numbness and headaches.  Psychiatric/Behavioral:  Negative for dysphoric mood and sleep disturbance. The patient is not nervous/anxious.      Lab Results  Component Value Date   NA 140 11/07/2022   K 4.5 11/07/2022   CO2 23 11/07/2022   GLUCOSE 141 (H) 11/07/2022   BUN 11 11/07/2022   CREATININE 0.67 11/07/2022   CALCIUM 9.1 11/07/2022   EGFR 98 11/07/2022   GFRNONAA 104 11/21/2019   Lab Results  Component Value Date   CHOL 180 11/07/2022   HDL 46 11/07/2022   LDLCALC 98 11/07/2022   TRIG 208 (H) 11/07/2022   CHOLHDL 3.9 11/07/2022   Lab Results  Component Value Date   TSH 3.690 11/07/2022   Lab Results  Component Value Date   HGBA1C 7.5 (H) 11/07/2022   Lab Results  Component Value Date   WBC 10.3 11/07/2022   HGB 13.2 11/07/2022   HCT 39.5 11/07/2022   MCV 94 11/07/2022   PLT 304 11/07/2022   Lab Results  Component Value Date   ALT 18 11/07/2022   AST 38 11/07/2022   ALKPHOS 103 11/07/2022   BILITOT 0.3 11/07/2022   Lab Results  Component Value Date  VD25OH 17.4 (L) 11/07/2022     Patient Active Problem List   Diagnosis Date Noted   Flexor tenosynovitis of finger 01/17/2022   S/P hysterectomy with oophorectomy 05/19/2019   BRCA2 gene mutation positive 03/03/2019   Type II diabetes mellitus with complication (HCC) 09/16/2016   Insomnia 09/16/2016   Arthritis of  carpometacarpal (CMC) joint of thumb 05/29/2016   Venous insufficiency of both lower extremities 01/17/2016   Acquired hypothyroidism 11/10/2014   Essential (primary) hypertension 11/10/2014   Fibromyalgia syndrome 11/10/2014   Mixed hyperlipidemia 11/10/2014   Irritable bowel syndrome with diarrhea 11/10/2014   Asthma, mild intermittent 11/10/2014   BMI 40.0-44.9, adult (HCC) 11/10/2014   Chronic peptic ulcer 11/10/2014   Psoriasis 11/10/2014   Obstructive apnea 11/10/2014   Chest pain 07/19/2012   Environmental and seasonal allergies 09/08/2011    Allergies  Allergen Reactions   Sulfa Antibiotics Other (See Comments)    seizure   Amlodipine Swelling    Leg swelling   Furosemide Swelling   Lyrica [Pregabalin] Other (See Comments)    Dizziness and blurred vision   Hydrochlorothiazide Itching and Rash    Past Surgical History:  Procedure Laterality Date   APPENDECTOMY     BREAST BIOPSY Left 2007   benign, dr Marquita Situ   CHOLECYSTECTOMY     CYSTOSCOPY  05/19/2019   Procedure: CYSTOSCOPY;  Surgeon: Heron Lord, MD;  Location: [facility];  Service: Gynecology;;   HYSTEROSCOPY WITH D & C N/A 12/14/2018   Procedure: DILATATION AND CURETTAGE /HYSTEROSCOPY WITH MYOSURE, VULVAR BIOPSY;  Surgeon: Heron Lord, MD;  Location: [facility];  Service: Gynecology;  Laterality: N/A;   SKIN BIOPSY Left    Back on L leg    SKIN CANCER EXCISION     BCCA of face   TONSILLECTOMY AND ADENOIDECTOMY     TOTAL LAPAROSCOPIC HYSTERECTOMY WITH SALPINGECTOMY N/A 05/19/2019   Procedure: TOTAL LAPAROSCOPIC HYSTERECTOMY WITH SALPINGECTOMY/ TLH/BSO;  Surgeon: Heron Lord, MD;  Location: [facility];  Service: Gynecology;  Laterality: N/A;    Social History   Tobacco Use   Smoking status: Never   Smokeless tobacco: Never  Vaping Use   Vaping status: Never Used  Substance Use Topics   Alcohol use: Yes    Alcohol/week: 2.0 standard drinks of alcohol    Types: 2 Standard drinks  or equivalent per week    Comment: rarely   Drug use: No     Medication list has been reviewed and updated.  Current Meds  Medication Sig   azithromycin  (ZITHROMAX  Z-PAK) 250 MG tablet UAD   Blood Glucose Monitoring Suppl DEVI 1 each by Does not apply route in the morning, at noon, and at bedtime. May substitute to any manufacturer covered by patient's insurance.   Glucose Blood (BLOOD GLUCOSE TEST STRIPS) STRP 1 each by In Vitro route in the morning, at noon, and at bedtime. May substitute to any manufacturer covered by patient's insurance.   Lancet Device MISC 1 each by Does not apply route in the morning, at noon, and at bedtime. May substitute to any manufacturer covered by patient's insurance.   Lancets Misc. MISC 1 each by Does not apply route in the morning, at noon, and at bedtime. May substitute to any manufacturer covered by patient's insurance.       07/20/2023    3:03 PM 11/07/2022    2:44 PM 09/09/2021    1:27 PM 05/20/2021    1:34 PM  GAD 7 : Generalized Anxiety Score  Nervous, Anxious, on Edge 0 0 0 0  Control/stop worrying 0 0 0 0  Worry too much - different things 0 0 1 0  Trouble relaxing 0 0 0 0  Restless 0 0 0 0  Easily annoyed or irritable 2 0 1 1  Afraid - awful might happen 0 0 0 0  Total GAD 7 Score 2 0 2 1  Anxiety Difficulty Not difficult at all Not difficult at all  Not difficult at all       07/20/2023    3:03 PM 03/31/2023   10:16 AM 11/07/2022    2:43 PM  Depression screen PHQ 2/9  Decreased Interest 2 0 0  Down, Depressed, Hopeless 0 0 0  PHQ - 2 Score 2 0 0  Altered sleeping 3 0 3  Tired, decreased energy 2 0 3  Change in appetite 1 0 2  Feeling bad or failure about yourself  0 0 0  Trouble concentrating 1 0 0  Moving slowly or fidgety/restless 0 0 0  Suicidal thoughts 0 0 0  PHQ-9 Score 9 0 8  Difficult doing work/chores Somewhat difficult Not difficult at all Somewhat difficult    BP Readings from Last 3 Encounters:  07/20/23 128/78   11/07/22 128/76  03/28/22 128/70    Physical Exam Vitals and nursing note reviewed.  Constitutional:      General: She is not in acute distress.    Appearance: She is well-developed.  HENT:     Head: Normocephalic and atraumatic.     Right Ear: Tympanic membrane and ear canal normal.     Left Ear: Tympanic membrane and ear canal normal.     Nose: No congestion.     Right Sinus: No maxillary sinus tenderness.     Left Sinus: No maxillary sinus tenderness.     Mouth/Throat:     Pharynx: Posterior oropharyngeal erythema present. No oropharyngeal exudate.  Cardiovascular:     Rate and Rhythm: Normal rate and regular rhythm.  Pulmonary:     Effort: Pulmonary effort is normal. No respiratory distress.     Breath sounds: No wheezing or rhonchi.  Musculoskeletal:     Cervical back: Normal range of motion.     Right lower leg: No edema.     Left lower leg: No edema.  Lymphadenopathy:     Cervical: No cervical adenopathy.  Skin:    General: Skin is warm and dry.     Findings: No rash.  Neurological:     General: No focal deficit present.     Mental Status: She is alert and oriented to person, place, and time.  Psychiatric:        Mood and Affect: Mood normal.        Behavior: Behavior normal.     Wt Readings from Last 3 Encounters:  07/20/23 227 lb (103 kg)  11/07/22 227 lb (103 kg)  03/28/22 233 lb 9.6 oz (106 kg)    BP 128/78   Pulse 83   Temp 97.9 F (36.6 C) (Oral)   Ht 5\' 1"  (1.549 m)   Wt 227 lb (103 kg)   SpO2 95%   BMI 42.89 kg/m   Assessment and Plan:  Problem List Items Addressed This Visit       Unprioritized   Acquired hypothyroidism (Chronic)   supplemented      Relevant Orders   TSH + free T4   Essential (primary) hypertension (Chronic)   Controlled BP with normal exam. Current  regimen is cardura , lisinopril  and nebivolol . Will continue same medications; encourage continued reduced sodium diet.       Relevant Medications   doxazosin   (CARDURA ) 4 MG tablet   Mixed hyperlipidemia (Chronic)   Relevant Medications   doxazosin  (CARDURA ) 4 MG tablet   Other Relevant Orders   Lipid panel   BRCA2 gene mutation positive (Chronic)   Relevant Medications   tamoxifen  (NOLVADEX ) 20 MG tablet   Environmental and seasonal allergies   Relevant Medications   montelukast  (SINGULAIR ) 10 MG tablet   Fibromyalgia syndrome   On Trazodone  and prn tramadol       Relevant Medications   doxepin  (SINEQUAN ) 100 MG capsule   cyclobenzaprine  (FLEXERIL ) 10 MG tablet   Chronic peptic ulcer   Relevant Medications   esomeprazole  (NEXIUM ) 40 MG capsule   Type II diabetes mellitus with complication (HCC) - Primary   Started on metformin  last visit.  No SE noted. Needs new meter to monitor BS Still living in [place] and going back tomorrow      Relevant Medications   Blood Glucose Monitoring Suppl DEVI   Glucose Blood (BLOOD GLUCOSE TEST STRIPS) STRP   Lancet Device MISC   Lancets Misc. MISC   metFORMIN  (GLUCOPHAGE -XR) 500 MG 24 hr tablet   Other Relevant Orders   Comprehensive metabolic panel   Hemoglobin A1c   Microalbumin / creatinine urine ratio   Insomnia   Relevant Medications   doxepin  (SINEQUAN ) 100 MG capsule   Other Visit Diagnoses       Long term current use of oral hypoglycemic drug         Acute pharyngitis, unspecified etiology       will give Rx of Zpak to start if symptoms worsen   Relevant Medications   azithromycin  (ZITHROMAX  Z-PAK) 250 MG tablet       Return in about 4 months (around 11/17/2023) for DM.    Sheron Dixons, MD [facility] [place]

## 2023-07-20 NOTE — Assessment & Plan Note (Signed)
 On Trazodone  and prn tramadol 

## 2023-07-20 NOTE — Assessment & Plan Note (Signed)
 supplemented

## 2023-07-21 ENCOUNTER — Encounter: Payer: Self-pay | Admitting: Internal Medicine

## 2023-07-21 LAB — COMPREHENSIVE METABOLIC PANEL
ALT: 15 [IU]/L (ref 0–32)
AST: 20 [IU]/L (ref 0–40)
Albumin: 3.8 g/dL — ABNORMAL LOW (ref 3.9–4.9)
Alkaline Phosphatase: 99 [IU]/L (ref 44–121)
BUN/Creatinine Ratio: 26 (ref 12–28)
BUN: 17 mg/dL (ref 8–27)
Bilirubin Total: 0.2 mg/dL (ref 0.0–1.2)
CO2: 25 mmol/L (ref 20–29)
Calcium: 9 mg/dL (ref 8.7–10.3)
Chloride: 105 mmol/L (ref 96–106)
Creatinine, Ser: 0.65 mg/dL (ref 0.57–1.00)
Globulin, Total: 2.6 g/dL (ref 1.5–4.5)
Glucose: 101 mg/dL — ABNORMAL HIGH (ref 70–99)
Potassium: 4.2 mmol/L (ref 3.5–5.2)
Sodium: 143 mmol/L (ref 134–144)
Total Protein: 6.4 g/dL (ref 6.0–8.5)
eGFR: 99 mL/min/{1.73_m2} (ref >59)

## 2023-07-21 LAB — LIPID PANEL
Chol/HDL Ratio: 3.7 {ratio} (ref 0.0–4.4)
Cholesterol, Total: 172 mg/dL (ref 100–199)
HDL: 47 mg/dL (ref >39)
LDL Chol Calc (NIH): 80 mg/dL (ref 0–99)
Triglycerides: 273 mg/dL — ABNORMAL HIGH (ref 0–149)
VLDL Cholesterol Cal: 45 mg/dL — ABNORMAL HIGH (ref 5–40)

## 2023-07-21 LAB — HEMOGLOBIN A1C
Est. average glucose Bld gHb Est-mCnc: 134 mg/dL
Hgb A1c MFr Bld: 6.3 % — ABNORMAL HIGH (ref 4.8–5.6)

## 2023-07-21 LAB — TSH+FREE T4
Free T4: 1.45 ng/dL (ref 0.82–1.77)
TSH: 3.99 u[IU]/mL (ref 0.450–4.500)

## 2023-07-26 ENCOUNTER — Other Ambulatory Visit: Payer: Self-pay | Admitting: Internal Medicine

## 2023-07-26 ENCOUNTER — Encounter: Payer: Self-pay | Admitting: Internal Medicine

## 2023-07-26 DIAGNOSIS — I10 Essential (primary) hypertension: Secondary | ICD-10-CM

## 2023-07-26 MED ORDER — NEBIVOLOL HCL 20 MG PO TABS: 1.0000 | ORAL_TABLET | Freq: Every day | ORAL | 1 refills | Status: DC

## 2023-09-04 ENCOUNTER — Encounter: Payer: Self-pay | Admitting: Internal Medicine

## 2023-09-04 NOTE — Telephone Encounter (Signed)
 Please review

## 2023-09-06 ENCOUNTER — Other Ambulatory Visit: Payer: Self-pay | Admitting: Internal Medicine

## 2023-09-06 DIAGNOSIS — E039 Hypothyroidism, unspecified: Secondary | ICD-10-CM

## 2023-09-06 MED ORDER — LEVOTHYROXINE SODIUM 200 MCG PO TABS: 200.0000 ug | ORAL_TABLET | Freq: Every day | ORAL | 0 refills | Status: DC

## 2023-09-07 NOTE — Telephone Encounter (Signed)
 I'm not sure what med refills you would like to just be sent in without your knowledge and I'm not comfortable sending your patients medications in without you acknowledging the fact that yes they need them and it's okay to send in.   Respectfully, Lyndal Pulley

## 2023-09-07 NOTE — Telephone Encounter (Signed)
 Okay thanks for infor.

## 2023-10-01 ENCOUNTER — Other Ambulatory Visit: Payer: Self-pay | Admitting: Internal Medicine

## 2023-10-01 DIAGNOSIS — K277 Chronic peptic ulcer, site unspecified, without hemorrhage or perforation: Secondary | ICD-10-CM

## 2023-10-02 NOTE — Telephone Encounter (Signed)
 Too soon for refill, last refill 07/20/23 for 90 and 1 refill.  Requested Prescriptions  Pending Prescriptions Disp Refills   esomeprazole  (NEXIUM ) 40 MG capsule [Pharmacy Med Name: Esomeprazole  Magnesium  40mg  Delayed-Release Capsule] 90 capsule 0    Sig: Take 1 capsule by mouth daily before supper at 5 PM.     Gastroenterology: Proton Pump Inhibitors 2 Passed - 10/02/2023 11:55 AM      Passed - ALT in normal range and within 360 days    ALT  Date Value Ref Range Status  07/20/2023 15 0 - 32 IU/L Final   SGPT (ALT)  Date Value Ref Range Status  03/07/2013 26 12 - 78 U/L Final         Passed - AST in normal range and within 360 days    AST  Date Value Ref Range Status  07/20/2023 20 0 - 40 IU/L Final   SGOT(AST)  Date Value Ref Range Status  03/07/2013 21 15 - 37 Unit/L Final         Passed - Valid encounter within last 12 months    Recent Outpatient Visits           2 months ago Type II diabetes mellitus with complication [facility])   [place] [facility] at [facility], Chales [place], MD   3 years ago Encounter for medication review   [place] [facility] [place] [facility] [place]. [facility] Valente Gaskin, [name]       Future Appointments             In 1 month Gala Jubilee, [place]ales [place], MD [facility] at [facility], [facility]

## 2023-10-05 ENCOUNTER — Other Ambulatory Visit: Payer: Self-pay | Admitting: Internal Medicine

## 2023-10-05 ENCOUNTER — Encounter: Payer: Self-pay | Admitting: Internal Medicine

## 2023-10-05 DIAGNOSIS — F5101 Primary insomnia: Secondary | ICD-10-CM

## 2023-10-05 MED ORDER — DOXEPIN HCL 100 MG PO CAPS: ORAL_CAPSULE | ORAL | 0 refills | Status: DC

## 2023-10-05 NOTE — Telephone Encounter (Signed)
 Please review.  KP

## 2023-10-18 ENCOUNTER — Other Ambulatory Visit: Payer: Self-pay | Admitting: Internal Medicine

## 2023-10-18 DIAGNOSIS — J3089 Other allergic rhinitis: Secondary | ICD-10-CM

## 2023-10-20 NOTE — Telephone Encounter (Signed)
 Requested Prescriptions  Pending Prescriptions Disp Refills   montelukast  (SINGULAIR ) 10 MG tablet [Pharmacy Med Name: Montelukast  Sodium 10mg  Tablet] 90 tablet 1    Sig: Take 1 tablet by mouth every night at bedtime.     Pulmonology:  Leukotriene Inhibitors Passed - 10/20/2023 12:50 PM      Passed - Valid encounter within last 12 months    Recent Outpatient Visits           3 months ago Type II diabetes mellitus with complication [facility])   [place] [facility] at [facility], Chales [place], MD   3 years ago Encounter for medication review   [place] [facility] [place] [facility] [place]. [facility] Valente Gaskin, [name]       Future Appointments             In 4 weeks Gala Jubilee, [place], MD [facility] at [facility], [facility]

## 2023-10-24 ENCOUNTER — Other Ambulatory Visit: Payer: Self-pay | Admitting: Internal Medicine

## 2023-10-24 DIAGNOSIS — F5101 Primary insomnia: Secondary | ICD-10-CM

## 2023-10-24 DIAGNOSIS — M797 Fibromyalgia: Secondary | ICD-10-CM

## 2023-10-27 NOTE — Telephone Encounter (Signed)
 Requested Prescriptions  Pending Prescriptions Disp Refills   traZODone  (DESYREL ) 50 MG tablet [Pharmacy Med Name: TRAZODONE  50MG  TABLETS] 180 tablet 0    Sig: TAKE 2 TABLETS(100 MG) BY MOUTH AT BEDTIME     Psychiatry: Antidepressants - Serotonin Modulator Passed - 10/27/2023 12:39 PM      Passed - Valid encounter within last 6 months    Recent Outpatient Visits           3 months ago Type II diabetes mellitus with complication [facility])   [place] [facility] at [facility], Chales [place], MD   3 years ago Encounter for medication review   [place] [facility] [place] [facility] [place]. [facility] Valente Gaskin, [name]       Future Appointments             In 3 weeks Gala Jubilee [place], MD [facility] at [facility], [facility]            Refused Prescriptions Disp Refills   doxepin  (SINEQUAN ) 100 MG capsule [Pharmacy Med Name: DOXEPIN  100MG  (HUNDRED MG) CAPSULES] 90 capsule 0    Sig: TAKE 1 CAPSULE(100 MG) BY MOUTH AT BEDTIME     Psychiatry:  Antidepressants - Heterocyclics (TCAs) Passed - 10/27/2023 12:39 PM      Passed - Valid encounter within last 6 months    Recent Outpatient Visits           3 months ago Type II diabetes mellitus with complication [facility])   [place] [facility] at [facility], Chales [place], MD   3 years ago Encounter for medication review   [place] [facility] [place] [facility] [place]. [facility] Valente Gaskin, [name]       Future Appointments             In 3 weeks Gala Jubilee [place], MD [facility] at [facility], [facility]           Psychiatry:  Antidepressants - Heterocyclics (TCAs) - doxepin  Passed - 10/27/2023 12:39 PM      Passed - Valid encounter within last 12 months    Recent Outpatient Visits           3 months ago Type II diabetes mellitus with complication  [facility])   [place] [facility] at [facility], Chales [place], MD   3 years ago Encounter for medication review    [facility] [place] [facility] [place]. [facility] Valente Gaskin, [name]       Future Appointments             In 3 weeks Gala Jubilee, [place], MD [facility] Medicine at [facility], [facility]

## 2023-10-30 ENCOUNTER — Encounter: Payer: Self-pay | Admitting: Internal Medicine

## 2023-10-30 DIAGNOSIS — M797 Fibromyalgia: Secondary | ICD-10-CM

## 2023-10-30 DIAGNOSIS — I10 Essential (primary) hypertension: Secondary | ICD-10-CM

## 2023-10-30 DIAGNOSIS — E118 Type 2 diabetes mellitus with unspecified complications: Secondary | ICD-10-CM

## 2023-11-03 ENCOUNTER — Other Ambulatory Visit: Payer: Self-pay | Admitting: Internal Medicine

## 2023-11-03 DIAGNOSIS — I10 Essential (primary) hypertension: Secondary | ICD-10-CM

## 2023-11-03 DIAGNOSIS — E118 Type 2 diabetes mellitus with unspecified complications: Secondary | ICD-10-CM

## 2023-11-03 DIAGNOSIS — M797 Fibromyalgia: Secondary | ICD-10-CM

## 2023-11-03 MED ORDER — TRAZODONE HCL 50 MG PO TABS: 50.0000 mg | ORAL_TABLET | Freq: Every day | ORAL | 0 refills | Status: AC

## 2023-11-03 MED ORDER — METFORMIN HCL ER 500 MG PO TB24: 500.0000 mg | ORAL_TABLET | Freq: Two times a day (BID) | ORAL | 0 refills | Status: DC

## 2023-11-03 MED ORDER — TRAMADOL HCL 50 MG PO TABS: 50.0000 mg | ORAL_TABLET | Freq: Two times a day (BID) | ORAL | 0 refills | Status: AC | PRN

## 2023-11-03 MED ORDER — NEBIVOLOL HCL 20 MG PO TABS: 1.0000 | ORAL_TABLET | Freq: Every day | ORAL | 0 refills | Status: DC

## 2023-11-11 ENCOUNTER — Other Ambulatory Visit: Payer: Self-pay

## 2023-11-11 DIAGNOSIS — I10 Essential (primary) hypertension: Secondary | ICD-10-CM

## 2023-11-11 MED ORDER — NEBIVOLOL HCL 20 MG PO TABS: 1.0000 | ORAL_TABLET | Freq: Every day | ORAL | 0 refills | Status: DC

## 2023-11-13 DIAGNOSIS — E119 Type 2 diabetes mellitus without complications: Secondary | ICD-10-CM | POA: Diagnosis not present

## 2023-11-13 DIAGNOSIS — H2513 Age-related nuclear cataract, bilateral: Secondary | ICD-10-CM | POA: Diagnosis not present

## 2023-11-13 DIAGNOSIS — H25041 Posterior subcapsular polar age-related cataract, right eye: Secondary | ICD-10-CM | POA: Diagnosis not present

## 2023-11-16 DIAGNOSIS — H2513 Age-related nuclear cataract, bilateral: Secondary | ICD-10-CM | POA: Diagnosis not present

## 2023-11-16 DIAGNOSIS — E119 Type 2 diabetes mellitus without complications: Secondary | ICD-10-CM | POA: Diagnosis not present

## 2023-11-16 DIAGNOSIS — H25041 Posterior subcapsular polar age-related cataract, right eye: Secondary | ICD-10-CM | POA: Diagnosis not present

## 2023-11-16 DIAGNOSIS — H2511 Age-related nuclear cataract, right eye: Secondary | ICD-10-CM | POA: Diagnosis not present

## 2023-11-17 ENCOUNTER — Encounter: Payer: Self-pay | Admitting: Ophthalmology

## 2023-11-18 ENCOUNTER — Encounter: Payer: Self-pay | Admitting: Internal Medicine

## 2023-11-18 ENCOUNTER — Ambulatory Visit (INDEPENDENT_AMBULATORY_CARE_PROVIDER_SITE_OTHER): Payer: Medicare Other | Admitting: Internal Medicine

## 2023-11-18 VITALS — BP 140/86 | HR 92 | Ht 61.0 in | Wt 214.1 lb

## 2023-11-18 DIAGNOSIS — M797 Fibromyalgia: Secondary | ICD-10-CM

## 2023-11-18 DIAGNOSIS — E559 Vitamin D deficiency, unspecified: Secondary | ICD-10-CM | POA: Diagnosis not present

## 2023-11-18 DIAGNOSIS — E118 Type 2 diabetes mellitus with unspecified complications: Secondary | ICD-10-CM | POA: Diagnosis not present

## 2023-11-18 DIAGNOSIS — Z6841 Body Mass Index (BMI) 40.0 and over, adult: Secondary | ICD-10-CM

## 2023-11-18 DIAGNOSIS — G4733 Obstructive sleep apnea (adult) (pediatric): Secondary | ICD-10-CM

## 2023-11-18 DIAGNOSIS — Z1501 Genetic susceptibility to malignant neoplasm of breast: Secondary | ICD-10-CM

## 2023-11-18 DIAGNOSIS — I10 Essential (primary) hypertension: Secondary | ICD-10-CM | POA: Diagnosis not present

## 2023-11-18 DIAGNOSIS — Z7984 Long term (current) use of oral hypoglycemic drugs: Secondary | ICD-10-CM

## 2023-11-18 DIAGNOSIS — Z1509 Genetic susceptibility to other malignant neoplasm: Secondary | ICD-10-CM

## 2023-11-18 LAB — POCT GLYCOSYLATED HEMOGLOBIN (HGB A1C): Hemoglobin A1C: 5.6 % (ref 4.0–5.6)

## 2023-11-18 NOTE — Assessment & Plan Note (Signed)
 BP high today due to being out of Bystolic  for the past week. Will resume Bystolic  as soon as possible. Continue other medications.

## 2023-11-18 NOTE — Assessment & Plan Note (Signed)
 She lost the Rx for new machine last year. She plans to find a mail order company to provide the equipment and supplies and let me know.

## 2023-11-18 NOTE — Assessment & Plan Note (Signed)
 Symptoms are stable on Trazodone  for sleep and prn Tramadol  for more severe pain. She takes about 2 tramadol  per day. No concerns for misuse or abuse.

## 2023-11-18 NOTE — Assessment & Plan Note (Addendum)
 Encourage her to schedule Mammogram as soon as possible.  She is in town for the next 5 weeks for cataract surgery. Continues on Tamoxifen  years 4/5 with no breast issues.

## 2023-11-18 NOTE — Assessment & Plan Note (Signed)
 Blood sugars have been stable.  No recent hypoglycemic events requiring assistance. Currently medications are MTF. Lab Results  Component Value Date   HGBA1C 5.6 11/18/2023   Last visit no changes were made.

## 2023-11-18 NOTE — Assessment & Plan Note (Signed)
 Begin Vitamin D3 25 mcg daily.

## 2023-11-18 NOTE — Patient Instructions (Addendum)
 Call [facility] to schedule your mammogram at [phone].  Take vitamin D3 1000 int units or 25 mcg. daily

## 2023-11-18 NOTE — Progress Notes (Signed)
 Date:  11/18/2023   Name:  [name]   DOB:  11-Sep-1960   MRN:  [id number]   Chief Complaint: Diabetes (Patient said she is not sleeping well)  Hypertension This is a chronic problem. The problem is controlled. Pertinent negatives include no chest pain, headaches, palpitations or shortness of breath. Past treatments include ACE inhibitors, beta blockers and central alpha agonists. The current treatment provides significant improvement.  Diabetes She presents for her follow-up diabetic visit. She has type 2 diabetes mellitus. Pertinent negatives for hypoglycemia include no dizziness, headaches or nervousness/anxiousness. Associated symptoms include fatigue. Pertinent negatives for diabetes include no chest pain. Current diabetic treatment includes oral agent (monotherapy) (MTF). An ACE inhibitor/angiotensin II receptor blocker is being taken.  OSA - has not used CPAP in over a year since her machine stopped working.  She moves around with her husband's job and does not want to use a company locally - she plans to look for one that will do mail order and then let me know to send the orders. Fibromyalgia - she is still suffering from diffuse pain, fatigue, and lower extremity swelling.  She can not take several diuretics and is unable to pull on compression stockings.   Review of Systems  Constitutional:  Positive for fatigue. Negative for chills, fever and unexpected weight change.  Respiratory:  Negative for chest tightness and shortness of breath.   Cardiovascular:  Positive for leg swelling. Negative for chest pain and palpitations.  Gastrointestinal:  Negative for abdominal pain.  Musculoskeletal:  Positive for arthralgias and myalgias.  Neurological:  Negative for dizziness, light-headedness and headaches.  Hematological:  Negative for adenopathy. Does not bruise/bleed easily.  Psychiatric/Behavioral:  Positive for dysphoric mood and sleep disturbance. The patient is not  nervous/anxious.      Lab Results  Component Value Date   NA 143 07/20/2023   K 4.2 07/20/2023   CO2 25 07/20/2023   GLUCOSE 101 (H) 07/20/2023   BUN 17 07/20/2023   CREATININE 0.65 07/20/2023   CALCIUM 9.0 07/20/2023   EGFR 99 07/20/2023   GFRNONAA 104 11/21/2019   Lab Results  Component Value Date   CHOL 172 07/20/2023   HDL 47 07/20/2023   LDLCALC 80 07/20/2023   TRIG 273 (H) 07/20/2023   CHOLHDL 3.7 07/20/2023   Lab Results  Component Value Date   TSH 3.990 07/20/2023   Lab Results  Component Value Date   HGBA1C 5.6 11/18/2023   Lab Results  Component Value Date   WBC 10.3 11/07/2022   HGB 13.2 11/07/2022   HCT 39.5 11/07/2022   MCV 94 11/07/2022   PLT 304 11/07/2022   Lab Results  Component Value Date   ALT 15 07/20/2023   AST 20 07/20/2023   ALKPHOS 99 07/20/2023   BILITOT 0.2 07/20/2023   Lab Results  Component Value Date   VD25OH 17.4 (L) 11/07/2022     Patient Active Problem List   Diagnosis Date Noted   Vitamin D  deficiency 11/18/2023   Flexor tenosynovitis of finger 01/17/2022   S/P hysterectomy with oophorectomy 05/19/2019   BRCA2 gene mutation positive 03/03/2019   Type II diabetes mellitus with complication (HCC) 09/16/2016   Insomnia 09/16/2016   Arthritis of carpometacarpal (CMC) joint of thumb 05/29/2016   Venous insufficiency of both lower extremities 01/17/2016   Acquired hypothyroidism 11/10/2014   Essential (primary) hypertension 11/10/2014   Fibromyalgia syndrome 11/10/2014   Mixed hyperlipidemia 11/10/2014   Irritable bowel syndrome with diarrhea 11/10/2014  Asthma, mild intermittent 11/10/2014   BMI 40.0-44.9, adult (HCC) 11/10/2014   Chronic peptic ulcer 11/10/2014   Psoriasis 11/10/2014   Obstructive apnea 11/10/2014   Chest pain 07/19/2012   Environmental and seasonal allergies 09/08/2011    Allergies  Allergen Reactions   Sulfa Antibiotics Other (See Comments)    seizure   Amlodipine Swelling    Leg swelling    Furosemide Swelling   Lyrica [Pregabalin] Other (See Comments)    Dizziness and blurred vision   Hydrochlorothiazide Itching and Rash    Past Surgical History:  Procedure Laterality Date   APPENDECTOMY     BREAST BIOPSY Left 2007   benign, dr Marquita Situ   CHOLECYSTECTOMY     CYSTOSCOPY  05/19/2019   Procedure: CYSTOSCOPY;  Surgeon: Heron Lord, MD;  Location: [facility];  Service: Gynecology;;   HYSTEROSCOPY WITH D & C N/A 12/14/2018   Procedure: DILATATION AND CURETTAGE /HYSTEROSCOPY WITH MYOSURE, VULVAR BIOPSY;  Surgeon: Heron Lord, MD;  Location: [facility];  Service: Gynecology;  Laterality: N/A;   SKIN BIOPSY Left    Back on L leg    SKIN CANCER EXCISION     BCCA of face   TONSILLECTOMY AND ADENOIDECTOMY     TOTAL LAPAROSCOPIC HYSTERECTOMY WITH SALPINGECTOMY N/A 05/19/2019   Procedure: TOTAL LAPAROSCOPIC HYSTERECTOMY WITH SALPINGECTOMY/ TLH/BSO;  Surgeon: Heron Lord, MD;  Location: [facility];  Service: Gynecology;  Laterality: N/A;    Social History   Tobacco Use   Smoking status: Never   Smokeless tobacco: Never  Vaping Use   Vaping status: Never Used  Substance Use Topics   Alcohol use: Yes    Alcohol/week: 2.0 standard drinks of alcohol    Types: 2 Standard drinks or equivalent per week    Comment: rarely   Drug use: No     Medication list has been reviewed and updated.  Current Meds  Medication Sig   albuterol  (VENTOLIN  HFA) 108 (90 Base) MCG/ACT inhaler Inhale 2 puffs into the lungs 4 (four) times daily as needed.   aspirin  81 MG EC tablet Take 81 mg by mouth at bedtime.    blood glucose meter kit and supplies KIT Dispense based on patient and insurance preference. Use up to four times daily as directed. (FOR ICD-9 250.00, 250.01).   Blood Glucose Monitoring Suppl DEVI 1 each by Does not apply route in the morning, at noon, and at bedtime. May substitute to any manufacturer covered by patient's insurance.   cetirizine (ZYRTEC) 10 MG  tablet Take 10 mg by mouth every morning.    cyclobenzaprine  (FLEXERIL ) 10 MG tablet Take 1 tablet (10 mg total) by mouth 3 (three) times daily.   diphenhydrAMINE  (BENADRYL ) 25 MG tablet Take 25-50 mg by mouth every 6 (six) hours as needed (allergies/asthma).   doxazosin  (CARDURA ) 4 MG tablet Take 1 tablet (4 mg total) by mouth daily.   doxepin  (SINEQUAN ) 100 MG capsule TAKE 1 CAPSULE(100 MG) BY MOUTH AT BEDTIME   esomeprazole  (NEXIUM ) 40 MG capsule TAKE 1 CAPSULE BY MOUTH EVERY DAY BEFORE SUPPER(5:OOPM)   FIBER, GUAR GUM, PO Take by mouth.   lactobacillus acidophilus (BACID) TABS tablet Take 2 tablets by mouth daily.   Lancets (FREESTYLE) lancets 1 each 4 (four) times daily.   levothyroxine  (SYNTHROID ) 200 MCG tablet Take 1 tablet (200 mcg total) by mouth daily.   lisinopril  (ZESTRIL ) 40 MG tablet Take 1 tablet (40 mg total) by mouth daily.   loperamide (IMODIUM A-D) 2 MG tablet Take  2-4 mg by mouth 4 (four) times daily as needed for diarrhea or loose stools.    meloxicam  (MOBIC ) 15 MG tablet TAKE 1 TABLET(15 MG) BY MOUTH DAILY   metFORMIN  (GLUCOPHAGE -XR) 500 MG 24 hr tablet Take 1 tablet (500 mg total) by mouth 2 (two) times daily with a meal.   montelukast  (SINGULAIR ) 10 MG tablet Take 1 tablet by mouth every night at bedtime.   Multiple Vitamins-Minerals (MULTIVITAMIN WOMEN 50+ PO) Take by mouth.   Nebivolol  HCl 20 MG TABS Take 1 tablet (20 mg total) by mouth daily.   OVER THE COUNTER MEDICATION Woman's multvitamin-Take 1 gummie daily.   tamoxifen  (NOLVADEX ) 20 MG tablet Take 1 tablet (20 mg total) by mouth daily.   traMADol  (ULTRAM ) 50 MG tablet Take 1 tablet (50 mg total) by mouth every 12 (twelve) hours as needed.   traZODone  (DESYREL ) 50 MG tablet Take 1-2 tablets (50-100 mg total) by mouth at bedtime.   vitamin B-12 (CYANOCOBALAMIN) 1000 MCG tablet Take 1500 mcg-2 gummie daily.       11/18/2023   10:59 AM 07/20/2023    3:03 PM 11/07/2022    2:44 PM 09/09/2021    1:27 PM  GAD 7 :  Generalized Anxiety Score  Nervous, Anxious, on Edge 0 0 0 0  Control/stop worrying 0 0 0 0  Worry too much - different things 0 0 0 1  Trouble relaxing 2 0 0 0  Restless 0 0 0 0  Easily annoyed or irritable 2 2 0 1  Afraid - awful might happen 0 0 0 0  Total GAD 7 Score 4 2 0 2  Anxiety Difficulty Not difficult at all Not difficult at all Not difficult at all        11/18/2023   10:58 AM 07/20/2023    3:03 PM 03/31/2023   10:16 AM  Depression screen PHQ 2/9  Decreased Interest 2 2 0  Down, Depressed, Hopeless 0 0 0  PHQ - 2 Score 2 2 0  Altered sleeping 3 3 0  Tired, decreased energy 3 2 0  Change in appetite 0 1 0  Feeling bad or failure about yourself  0 0 0  Trouble concentrating 1 1 0  Moving slowly or fidgety/restless 0 0 0  Suicidal thoughts 0 0 0  PHQ-9 Score 9 9 0  Difficult doing work/chores Somewhat difficult Somewhat difficult Not difficult at all    BP Readings from Last 3 Encounters:  11/18/23 (!) 140/86  07/20/23 128/78  11/07/22 128/76    Physical Exam Vitals and nursing note reviewed.  Constitutional:      General: She is not in acute distress.    Appearance: Normal appearance. She is well-developed.  HENT:     Head: Normocephalic and atraumatic.  Neck:     Vascular: No carotid bruit.  Cardiovascular:     Rate and Rhythm: Normal rate and regular rhythm.  Pulmonary:     Effort: Pulmonary effort is normal. No respiratory distress.     Breath sounds: No wheezing or rhonchi.  Musculoskeletal:     Right lower leg: Edema present.     Left lower leg: Edema present.  Lymphadenopathy:     Cervical: No cervical adenopathy.  Skin:    General: Skin is warm and dry.     Findings: No rash.  Neurological:     Mental Status: She is alert and oriented to person, place, and time.  Psychiatric:        Mood and  Affect: Mood normal.        Behavior: Behavior normal.     Wt Readings from Last 3 Encounters:  11/18/23 214 lb 2 oz (97.1 kg)  07/20/23 227 lb  (103 kg)  11/07/22 227 lb (103 kg)    BP (!) 140/86   Pulse 92   Ht 5' 1 (1.549 m)   Wt 214 lb 2 oz (97.1 kg)   SpO2 98%   BMI 40.46 kg/m   Assessment and Plan:  Problem List Items Addressed This Visit       Unprioritized   Essential (primary) hypertension (Chronic)   BP high today due to being out of Bystolic  for the past week. Will resume Bystolic  as soon as possible. Continue other medications.      Fibromyalgia syndrome   Symptoms are stable on Trazodone  for sleep and prn Tramadol  for more severe pain. She takes about 2 tramadol  per day. No concerns for misuse or abuse.      BMI 40.0-44.9, adult (HCC)   Obstructive apnea (Chronic)   She lost the Rx for new machine last year. She plans to find a mail order company to provide the equipment and supplies and let me know.      Type II diabetes mellitus with complication (HCC) - Primary   Blood sugars have been stable.  No recent hypoglycemic events requiring assistance. Currently medications are MTF. Lab Results  Component Value Date   HGBA1C 5.6 11/18/2023   Last visit no changes were made.       Relevant Orders   POCT glycosylated hemoglobin (Hb A1C) (Completed)   Microalbumin / creatinine urine ratio   BRCA2 gene mutation positive (Chronic)   Encourage her to schedule Mammogram as soon as possible.  She is in town for the next 5 weeks for cataract surgery. Continues on Tamoxifen  years 4/5 with no breast issues.      Vitamin D  deficiency   Begin Vitamin D3 25 mcg daily.      Other Visit Diagnoses       Long term current use of oral hypoglycemic drug           Return in about 4 months (around 03/19/2024) for DM, HTN.    Sheron Dixons, MD [facility] [place]

## 2023-11-19 LAB — MICROALBUMIN / CREATININE URINE RATIO
Creatinine, Urine: 139.6 mg/dL
Microalb/Creat Ratio: 13 mg/g{creat} (ref 0–29)
Microalbumin, Urine: 18.5 ug/mL

## 2023-11-24 NOTE — Discharge Instructions (Signed)

## 2023-11-26 ENCOUNTER — Ambulatory Visit: Payer: Self-pay | Admitting: Anesthesiology

## 2023-11-26 ENCOUNTER — Ambulatory Visit
Admission: RE | Admit: 2023-11-26 | Discharge: 2023-11-26 | Disposition: A | Discharge status: Home / Self Care | Attending: Ophthalmology | Admitting: Ophthalmology

## 2023-11-26 ENCOUNTER — Other Ambulatory Visit: Payer: Self-pay

## 2023-11-26 ENCOUNTER — Encounter
Admission: RE | Disposition: A | Discharge status: Home / Self Care | Payer: Self-pay | Source: Home / Self Care | Attending: Ophthalmology

## 2023-11-26 DIAGNOSIS — G4733 Obstructive sleep apnea (adult) (pediatric): Secondary | ICD-10-CM | POA: Diagnosis not present

## 2023-11-26 DIAGNOSIS — E039 Hypothyroidism, unspecified: Secondary | ICD-10-CM | POA: Insufficient documentation

## 2023-11-26 DIAGNOSIS — E1136 Type 2 diabetes mellitus with diabetic cataract: Secondary | ICD-10-CM | POA: Diagnosis not present

## 2023-11-26 DIAGNOSIS — K219 Gastro-esophageal reflux disease without esophagitis: Secondary | ICD-10-CM | POA: Diagnosis not present

## 2023-11-26 DIAGNOSIS — K449 Diaphragmatic hernia without obstruction or gangrene: Secondary | ICD-10-CM | POA: Insufficient documentation

## 2023-11-26 DIAGNOSIS — Z8711 Personal history of peptic ulcer disease: Secondary | ICD-10-CM | POA: Insufficient documentation

## 2023-11-26 DIAGNOSIS — Z7984 Long term (current) use of oral hypoglycemic drugs: Secondary | ICD-10-CM | POA: Insufficient documentation

## 2023-11-26 DIAGNOSIS — I10 Essential (primary) hypertension: Secondary | ICD-10-CM | POA: Diagnosis not present

## 2023-11-26 DIAGNOSIS — H2511 Age-related nuclear cataract, right eye: Secondary | ICD-10-CM | POA: Insufficient documentation

## 2023-11-26 DIAGNOSIS — H25041 Posterior subcapsular polar age-related cataract, right eye: Secondary | ICD-10-CM | POA: Diagnosis not present

## 2023-11-26 HISTORY — DX: Type 2 diabetes mellitus without complications: E11.9

## 2023-11-26 HISTORY — PX: CATARACT EXTRACTION W/PHACO: SHX586

## 2023-11-26 HISTORY — DX: Chronic peptic ulcer, site unspecified, without hemorrhage or perforation: K27.7

## 2023-11-26 LAB — GLUCOSE, CAPILLARY: Glucose-Capillary: 117 mg/dL — ABNORMAL HIGH (ref 70–99)

## 2023-11-26 SURGERY — PHACOEMULSIFICATION, CATARACT, WITH IOL INSERTION
Anesthesia: Monitor Anesthesia Care | Laterality: Right

## 2023-11-26 MED ORDER — OXYCODONE HCL 5 MG/5ML PO SOLN: 5.0000 mg | Freq: Once | ORAL | Status: DC | PRN

## 2023-11-26 MED ORDER — SIGHTPATH DOSE#1 NA HYALUR & NA CHOND-NA HYALUR IO KIT
PACK | INTRAOCULAR | Status: DC | PRN
  Administered 2023-11-26: 1 via OPHTHALMIC

## 2023-11-26 MED ORDER — MOXIFLOXACIN HCL 0.5 % OP SOLN
OPHTHALMIC | Status: DC | PRN
  Administered 2023-11-26: .2 mL via OPHTHALMIC

## 2023-11-26 MED ORDER — FENTANYL CITRATE (PF) 100 MCG/2ML IJ SOLN
INTRAMUSCULAR | Status: AC
Start: 2023-11-26 — End: 2023-11-26
  Filled 2023-11-26: qty 2

## 2023-11-26 MED ORDER — SIGHTPATH DOSE#1 BSS IO SOLN
INTRAOCULAR | Status: DC | PRN
  Administered 2023-11-26: 15 mL via INTRAOCULAR

## 2023-11-26 MED ORDER — BRIMONIDINE TARTRATE-TIMOLOL 0.2-0.5 % OP SOLN
OPHTHALMIC | Status: DC | PRN
  Administered 2023-11-26: 1 [drp] via OPHTHALMIC

## 2023-11-26 MED ORDER — PHENYLEPHRINE-KETOROLAC 1-0.3 % IO SOLN
INTRAOCULAR | Status: DC | PRN
  Administered 2023-11-26: 97 mL via OPHTHALMIC

## 2023-11-26 MED ORDER — TETRACAINE HCL 0.5 % OP SOLN
OPHTHALMIC | Status: AC
  Filled 2023-11-26: qty 4

## 2023-11-26 MED ORDER — ARMC OPHTHALMIC DILATING DROPS
1.0000 | OPHTHALMIC | Status: DC | PRN
  Administered 2023-11-26 (×3): 1 via OPHTHALMIC

## 2023-11-26 MED ORDER — MIDAZOLAM HCL 2 MG/2ML IJ SOLN
INTRAMUSCULAR | Status: DC | PRN
  Administered 2023-11-26 (×2): 1 mg via INTRAVENOUS

## 2023-11-26 MED ORDER — FENTANYL CITRATE (PF) 100 MCG/2ML IJ SOLN
INTRAMUSCULAR | Status: DC | PRN
  Administered 2023-11-26 (×2): 50 ug via INTRAVENOUS

## 2023-11-26 MED ORDER — FENTANYL CITRATE PF 50 MCG/ML IJ SOSY: 25.0000 ug | PREFILLED_SYRINGE | INTRAMUSCULAR | Status: DC | PRN

## 2023-11-26 MED ORDER — LACTATED RINGERS IV SOLN: INTRAVENOUS | Status: DC

## 2023-11-26 MED ORDER — OXYCODONE HCL 5 MG PO TABS: 5.0000 mg | ORAL_TABLET | Freq: Once | ORAL | Status: DC | PRN

## 2023-11-26 MED ORDER — MIDAZOLAM HCL 2 MG/2ML IJ SOLN
INTRAMUSCULAR | Status: AC
  Filled 2023-11-26: qty 2

## 2023-11-26 MED ORDER — TETRACAINE HCL 0.5 % OP SOLN
1.0000 [drp] | OPHTHALMIC | Status: DC | PRN
  Administered 2023-11-26 (×3): 1 [drp] via OPHTHALMIC

## 2023-11-26 MED ORDER — LIDOCAINE HCL (PF) 2 % IJ SOLN
INTRAOCULAR | Status: DC | PRN
  Administered 2023-11-26: 4 mL via INTRAOCULAR

## 2023-11-26 MED ORDER — ARMC OPHTHALMIC DILATING DROPS
OPHTHALMIC | Status: AC
  Filled 2023-11-26: qty 0.5

## 2023-11-26 SURGICAL SUPPLY — 12 items
CATARACT SUITE SIGHTPATH (MISCELLANEOUS) ×1 IMPLANT
DISSECTOR HYDRO NUCLEUS 50X22 (MISCELLANEOUS) ×1 IMPLANT
DRSG TEGADERM 2-3/8X2-3/4 SM (GAUZE/BANDAGES/DRESSINGS) ×1 IMPLANT
FEE CATARACT SUITE SIGHTPATH (MISCELLANEOUS) ×1 IMPLANT
GLOVE BIOGEL PI IND STRL 8 (GLOVE) ×1 IMPLANT
GLOVE SURG LX STRL 7.5 STRW (GLOVE) ×1 IMPLANT
GLOVE SURG PROTEXIS BL SZ6.5 (GLOVE) ×1 IMPLANT
GLOVE SURG SYN 6.5 PF PI BL (GLOVE) ×1 IMPLANT
LENS IOL TECNIS MONO 23.0 (Intraocular Lens) IMPLANT
NDL FILTER BLUNT 18X1 1/2 (NEEDLE) ×1 IMPLANT
NEEDLE FILTER BLUNT 18X1 1/2 (NEEDLE) ×1 IMPLANT
SYR 3ML LL SCALE MARK (SYRINGE) ×1 IMPLANT

## 2023-11-26 NOTE — Anesthesia Postprocedure Evaluation (Signed)
 Anesthesia Post Note  Patient: [name]  Procedure(s) Performed: PHACOEMULSIFICATION, CATARACT, WITH IOL INSERTION 19.41, 01:43.6 (Right)  Patient location during evaluation: PACU Anesthesia Type: MAC Level of consciousness: awake and alert Pain management: pain level controlled Vital Signs Assessment: post-procedure vital signs reviewed and stable Respiratory status: spontaneous breathing, nonlabored ventilation, respiratory function stable and patient connected to nasal cannula oxygen Cardiovascular status: blood pressure returned to baseline and stable Postop Assessment: no apparent nausea or vomiting Anesthetic complications: no   No notable events documented.   Last Vitals:  Vitals:   11/26/23 0945 11/26/23 0951  BP: (!) 156/75 (!) 175/73  Pulse: 65 62  Resp: 14 15  Temp: (!) 36.2 C (!) 36.2 C  SpO2: 97% 95%    Last Pain:  Vitals:   11/26/23 0951  TempSrc:   PainSc: 0-No pain                 Portia Brittle Aubrianne Molyneux

## 2023-11-26 NOTE — Op Note (Signed)
 OPERATIVE NOTE  [name] [id number] 11/26/2023   PREOPERATIVE DIAGNOSIS: Nuclear sclerotic cataract right eye. H25.11   POSTOPERATIVE DIAGNOSIS: Nuclear sclerotic cataract right eye. H25.11   PROCEDURE:  Phacoemusification with posterior chamber intraocular lens placement of the right eye  Ultrasound time: Procedure(s): PHACOEMULSIFICATION, CATARACT, WITH IOL INSERTION 19.41, 01:43.6 (Right)  LENS:   Implant Name Type Inv. Item Serial No. Manufacturer Lot No. LRB No. Used Action  LENS IOL TECNIS MONO 23.0 - [id number] Intraocular Lens LENS IOL TECNIS MONO 23.0 [id number] [employer]  Right 1 Implanted      SURGEON:  Rosy Cooper. Donalda Fruit, MD   ANESTHESIA:  Topical with tetracaine drops, augmented with 1% preservative-free intracameral lidocaine .   COMPLICATIONS:  None.   DESCRIPTION OF PROCEDURE:  The patient was identified in the holding room and transported to the operating room and placed in the supine position under the operating microscope.  The right eye was identified as the operative eye, which was prepped and draped in the usual sterile ophthalmic fashion.   A 1 millimeter clear-corneal paracentesis was made superotemporally. Preservative-free 1% lidocaine  mixed with 1:1,000 bisulfite-free aqueous solution of epinephrine was injected into the anterior chamber. The anterior chamber was then filled with Viscoat viscoelastic. A 2.4 millimeter keratome was used to make a clear-corneal incision inferotemporally. A curvilinear capsulorrhexis was made with a cystotome and capsulorrhexis forceps. Balanced salt solution was used to hydrodissect and hydrodelineate the nucleus. Phacoemulsification was then used to remove the lens nucleus and epinucleus. The remaining cortex was then removed using the irrigation and aspiration handpiece. Provisc was then placed into the capsular bag to distend it for lens placement. A +23.00 D [id number] intraocular lens was then injected into the capsular bag.  The remaining viscoelastic was aspirated.   Wounds were hydrated with balanced salt solution.  The anterior chamber was inflated to a physiologic pressure with balanced salt solution.  No wound leaks were noted. Moxifloxacin was injected intracamerally.  Timolol and Brimonidine drops were applied to the eye.  The patient was taken to the recovery room in stable condition without complications of anesthesia or surgery.  [name] 11/26/2023, 9:43 AM

## 2023-11-26 NOTE — Transfer of Care (Signed)
 Immediate Anesthesia Transfer of Care Note  Patient: [name]  Procedure(s) Performed: PHACOEMULSIFICATION, CATARACT, WITH IOL INSERTION 19.41, 01:43.6 (Right)  Patient Location: PACU  Anesthesia Type: MAC  Level of Consciousness: awake, alert  and patient cooperative  Airway and Oxygen Therapy: Patient Spontanous Breathing and Patient connected to supplemental oxygen  Post-op Assessment: Post-op Vital signs reviewed, Patient's Cardiovascular Status Stable, Respiratory Function Stable, Patent Airway and No signs of Nausea or vomiting  Post-op Vital Signs: Reviewed and stable  Complications: No notable events documented.

## 2023-11-26 NOTE — H&P (Signed)
 [facility]   Primary Care Physician:  Sheron Dixons, MD Ophthalmologist: Dr. Meg Spina  Pre-Procedure History & Physical: HPI:  [name] is a 63 y.o. female here for cataract surgery.   Past Medical History:  Diagnosis Date   Allergic rhinitis    Anemia    Arthritis    Asthma    well controlled   Basal cell carcinoma 07/1997   Treated by Dr Adan Holms   BRCA2 gene mutation positive 12/2018   MyRisk BRCA 2 positive with special interpretation--mutation has less penetrance than usual BRCA 2 mutation   Bursitis    Chronic peptic ulcer    Complication of anesthesia    thrashing and screaming after cholecystectomy   Dermatitis, eczematoid 09/30/2011   Duodenal ulcer    Family history of breast cancer    Fibromyalgia    Gastritis    GERD (gastroesophageal reflux disease)    Headache    h/o migraines   Heart murmur    asymptomatic   Hiatal hernia    History of kidney stones    h/o   Hypertension    Hypothyroidism    IBS (irritable bowel syndrome)    OSA (obstructive sleep apnea)    has CPAP-not using currently due to needing new mask as of 05-13-19   Pneumonia 2009   Psoriasis    Rheumatic fever    Type II diabetes mellitus (HCC)     Past Surgical History:  Procedure Laterality Date   APPENDECTOMY     BREAST BIOPSY Left 2007   benign, dr Marquita Situ   CHOLECYSTECTOMY     CYSTOSCOPY  05/19/2019   Procedure: CYSTOSCOPY;  Surgeon: Heron Lord, MD;  Location: [facility];  Service: Gynecology;;   HYSTEROSCOPY WITH D & C N/A 12/14/2018   Procedure: DILATATION AND CURETTAGE /HYSTEROSCOPY WITH MYOSURE, VULVAR BIOPSY;  Surgeon: Heron Lord, MD;  Location: [facility];  Service: Gynecology;  Laterality: N/A;   SKIN BIOPSY Left    Back on L leg    SKIN CANCER EXCISION     BCCA of face   TONSILLECTOMY AND ADENOIDECTOMY     TOTAL LAPAROSCOPIC HYSTERECTOMY WITH SALPINGECTOMY N/A 05/19/2019   Procedure: TOTAL LAPAROSCOPIC HYSTERECTOMY WITH  SALPINGECTOMY/ TLH/BSO;  Surgeon: Heron Lord, MD;  Location: [facility];  Service: Gynecology;  Laterality: N/A;    Prior to Admission medications   Medication Sig Start Date End Date Taking? Authorizing Provider  albuterol  (VENTOLIN  HFA) 108 (90 Base) MCG/ACT inhaler Inhale 2 puffs into the lungs 4 (four) times daily as needed. 09/09/21   Sheron Dixons, MD  aspirin  81 MG EC tablet Take 81 mg by mouth at bedtime.     [provider]  blood glucose meter kit and supplies KIT Dispense based on patient and insurance preference. Use up to four times daily as directed. (FOR ICD-9 250.00, 250.01). 05/20/19   Darl Edu, MD  Blood Glucose Monitoring Suppl DEVI 1 each by Does not apply route in the morning, at noon, and at bedtime. May substitute to any manufacturer covered by patient's insurance. 07/20/23   Berglund, Laura H, MD  Calcitriol (VECTICAL) 3 MCG/GM cream Apply 3 mcg topically at bedtime. Patient not taking: Reported on 11/18/2023 11/11/11   [provider]  cetirizine (ZYRTEC) 10 MG tablet Take 10 mg by mouth every morning.     [provider]  cyclobenzaprine  (FLEXERIL ) 10 MG tablet Take 1 tablet (10 mg total) by mouth 3 (three) times daily. 07/20/23  Sheron Dixons, MD  diphenhydrAMINE  (BENADRYL ) 25 MG tablet Take 25-50 mg by mouth every 6 (six) hours as needed (allergies/asthma).    [provider]  doxazosin  (CARDURA ) 4 MG tablet Take 1 tablet (4 mg total) by mouth daily. 07/20/23   Sheron Dixons, MD  doxepin  (SINEQUAN ) 100 MG capsule TAKE 1 CAPSULE(100 MG) BY MOUTH AT BEDTIME 10/05/23   Sheron Dixons, MD  esomeprazole  (NEXIUM ) 40 MG capsule TAKE 1 CAPSULE BY MOUTH EVERY DAY BEFORE SUPPER(5:OOPM) 07/20/23   Sheron Dixons, MD  FIBER, GUAR GUM, PO Take by mouth.    [provider]  Fluocinolone  Acetonide 0.01 % OIL Apply 1-2 drops to ear canals 1-2 times daily as needed for psoriasis. Patient not taking: Reported on  11/18/2023 05/16/20   Artemio Larry, MD  lactobacillus acidophilus (BACID) TABS tablet Take 2 tablets by mouth daily.    [provider]  Lancets (FREESTYLE) lancets 1 each 4 (four) times daily. 05/20/19   [provider]  levothyroxine  (SYNTHROID ) 200 MCG tablet Take 1 tablet (200 mcg total) by mouth daily. 09/06/23   Sheron Dixons, MD  lisinopril  (ZESTRIL ) 40 MG tablet Take 1 tablet (40 mg total) by mouth daily. 06/05/23   Sheron Dixons, MD  loperamide (IMODIUM A-D) 2 MG tablet Take 2-4 mg by mouth 4 (four) times daily as needed for diarrhea or loose stools.     [provider]  meloxicam  (MOBIC ) 15 MG tablet TAKE 1 TABLET(15 MG) BY MOUTH DAILY 06/05/23   Berglund, Laura H, MD  metFORMIN  (GLUCOPHAGE -XR) 500 MG 24 hr tablet Take 1 tablet (500 mg total) by mouth 2 (two) times daily with a meal. 11/03/23   Sheron Dixons, MD  montelukast  (SINGULAIR ) 10 MG tablet Take 1 tablet by mouth every night at bedtime. 10/20/23   Sheron Dixons, MD  Multiple Vitamins-Minerals (MULTIVITAMIN WOMEN 50+ PO) Take by mouth.    [provider]  Nebivolol  HCl 20 MG TABS Take 1 tablet (20 mg total) by mouth daily. 11/11/23   Sheron Dixons, MD  OVER THE COUNTER MEDICATION Woman's multvitamin-Take 1 gummie daily.    [provider]  tamoxifen  (NOLVADEX ) 20 MG tablet Take 1 tablet (20 mg total) by mouth daily. 07/20/23   Sheron Dixons, MD  traMADol  (ULTRAM ) 50 MG tablet Take 1 tablet (50 mg total) by mouth every 12 (twelve) hours as needed. 11/03/23 05/01/24  Sheron Dixons, MD  traZODone  (DESYREL ) 50 MG tablet Take 1-2 tablets (50-100 mg total) by mouth at bedtime. 11/03/23   Sheron Dixons, MD  vitamin B-12 (CYANOCOBALAMIN) 1000 MCG tablet Take 1500 mcg-2 gummie daily.    [provider]    Allergies as of 09/03/2023 - Review Complete 07/20/2023  Allergen Reaction Noted   Sulfa antibiotics Other (See Comments) 11/09/2014   Amlodipine Swelling  11/10/2014   Furosemide Swelling 11/10/2014   Lyrica [pregabalin] Other (See Comments) 11/09/2014   Hydrochlorothiazide Itching and Rash 11/09/2014    Family History  Problem Relation Age of Onset   Breast cancer Mother 53   CAD Father    Heart attack Father    Bladder Cancer Father 54   Breast cancer Paternal Grandmother 57   Leukemia Paternal Grandmother 37   Diabetes Other        multiple family members   Breast cancer Maternal Aunt 75    Social History   Socioeconomic History   Marital status: Married    Spouse name: Not  on file   Number of children: Not on file   Years of education: Not on file   Highest education level: Not on file  Occupational History   Occupation: disabled  Tobacco Use   Smoking status: Never   Smokeless tobacco: Never  Vaping Use   Vaping status: Never Used  Substance and Sexual Activity   Alcohol use: Yes    Alcohol/week: 2.0 standard drinks of alcohol    Types: 2 Standard drinks or equivalent per week    Comment: rarely   Drug use: No   Sexual activity: Yes    Birth control/protection: Post-menopausal  Other Topics Concern   Not on file  Social History Narrative   Not on file   Social Drivers of Health   Financial Resource Strain: Low Risk  (03/31/2023)   Overall Financial Resource Strain (CARDIA)    Difficulty of Paying Living Expenses: Not hard at all  Food Insecurity: No Food Insecurity (03/31/2023)   Hunger Vital Sign    Worried About Running Out of Food in the Last Year: Never true    Ran Out of Food in the Last Year: Never true  Transportation Needs: No Transportation Needs (03/31/2023)   PRAPARE - Administrator, Civil Service (Medical): No    Lack of Transportation (Non-Medical): No  Physical Activity: Inactive (03/31/2023)   Exercise Vital Sign    Days of Exercise per Week: 0 days    Minutes of Exercise per Session: 0 min  Stress: Stress Concern Present (03/31/2023)   [employer] of Occupational  Health - Occupational Stress Questionnaire    Feeling of Stress : To some extent  Social Connections: Moderately Isolated (03/31/2023)   Social Connection and Isolation Panel    Frequency of Communication with Friends and Family: More than three times a week    Frequency of Social Gatherings with Friends and Family: Never    Attends Religious Services: Never    Database administrator or Organizations: No    Attends Banker Meetings: Never    Marital Status: Married  Catering manager Violence: Not At Risk (03/31/2023)   Humiliation, Afraid, Rape, and Kick questionnaire    Fear of Current or Ex-Partner: No    Emotionally Abused: No    Physically Abused: No    Sexually Abused: No    Review of Systems: See HPI, otherwise negative ROS  Physical Exam: Ht 5' 1 (1.549 m)   Wt 105.7 kg   BMI 44.02 kg/m  General:   Alert, cooperative in NAD Head:  Normocephalic and atraumatic. Respiratory:  Normal work of breathing. Cardiovascular:  RRR  Impression/Plan: [name] is here for cataract surgery.  Risks, benefits, limitations, and alternatives regarding cataract surgery have been reviewed with the patient.  Questions have been answered.  All parties agreeable.   Trudi Fus, MD  11/26/2023, 7:04 AM

## 2023-11-26 NOTE — Anesthesia Preprocedure Evaluation (Signed)
 Anesthesia Evaluation  Patient identified by MRN, date of birth, ID band Patient awake    Reviewed: Allergy & Precautions, NPO status , Patient's Chart, lab work & pertinent test results  History of Anesthesia Complications (+) Emergence Delirium and history of anesthetic complications  Airway Mallampati: III  TM Distance: <3 FB Neck ROM: full    Dental  (+) Chipped   Pulmonary asthma , sleep apnea    Pulmonary exam normal        Cardiovascular hypertension, Normal cardiovascular exam+ Valvular Problems/Murmurs      Neuro/Psych  Headaches  Neuromuscular disease  negative psych ROS   GI/Hepatic Neg liver ROS, hiatal hernia, PUD,GERD  Controlled,,  Endo/Other  diabetesHypothyroidism    Renal/GU      Musculoskeletal   Abdominal   Peds  Hematology negative hematology ROS (+)   Anesthesia Other Findings Past Medical History: No date: Allergic rhinitis No date: Anemia No date: Arthritis No date: Asthma     Comment:  well controlled 07/1997: Basal cell carcinoma     Comment:  Treated by Dr Adan Holms 12/2018: BRCA2 gene mutation positive     Comment:  MyRisk BRCA 2 positive with special               interpretation--mutation has less penetrance than usual               BRCA 2 mutation No date: Bursitis No date: Chronic peptic ulcer No date: Complication of anesthesia     Comment:  thrashing and screaming after cholecystectomy 09/30/2011: Dermatitis, eczematoid No date: Duodenal ulcer No date: Family history of breast cancer No date: Fibromyalgia No date: Gastritis No date: GERD (gastroesophageal reflux disease) No date: Headache     Comment:  h/o migraines No date: Heart murmur     Comment:  asymptomatic No date: Hiatal hernia No date: History of kidney stones     Comment:  h/o No date: Hypertension No date: Hypothyroidism No date: IBS (irritable bowel syndrome) No date: OSA (obstructive sleep apnea)      Comment:  has CPAP-not using currently due to needing new mask as               of 05-13-19 2009: Pneumonia No date: Psoriasis No date: Rheumatic fever No date: Type II diabetes mellitus (HCC)  Past Surgical History: No date: APPENDECTOMY 2007: BREAST BIOPSY; Left     Comment:  benign, dr Marquita Situ No date: CHOLECYSTECTOMY 05/19/2019: CYSTOSCOPY     Comment:  Procedure: CYSTOSCOPY;  Surgeon: Heron Lord,               MD;  Location: [facility];  Service: Gynecology;; 12/14/2018: HYSTEROSCOPY WITH D & C; N/A     Comment:  Procedure: DILATATION AND CURETTAGE /HYSTEROSCOPY WITH               MYOSURE, VULVAR BIOPSY;  Surgeon: Heron Lord,               MD;  Location: [facility];  Service: Gynecology;                Laterality: N/A; No date: SKIN BIOPSY; Left     Comment:  Back on L leg  No date: SKIN CANCER EXCISION     Comment:  BCCA of face No date: TONSILLECTOMY AND ADENOIDECTOMY 05/19/2019: TOTAL LAPAROSCOPIC HYSTERECTOMY WITH SALPINGECTOMY; N/A     Comment:  Procedure: TOTAL LAPAROSCOPIC HYSTERECTOMY WITH  SALPINGECTOMY/ TLH/BSO;  Surgeon: Heron Lord,               MD;  Location: [facility];  Service: Gynecology;                Laterality: N/A;  BMI    Body Mass Index: 44.02 kg/m      Reproductive/Obstetrics negative OB ROS                             Anesthesia Physical Anesthesia Plan  ASA: 3  Anesthesia Plan: MAC   Post-op Pain Management:    Induction: Intravenous  PONV Risk Score and Plan:   Airway Management Planned: Natural Airway and Nasal Cannula  Additional Equipment:   Intra-op Plan:   Post-operative Plan:   Informed Consent: I have reviewed the patients History and Physical, chart, labs and discussed the procedure including the risks, benefits and alternatives for the proposed anesthesia with the patient or authorized representative who has indicated his/her understanding and  acceptance.     Dental Advisory Given  Plan Discussed with: Anesthesiologist, CRNA and Surgeon  Anesthesia Plan Comments: (Patient consented for risks of anesthesia including but not limited to:  - adverse reactions to medications - damage to eyes, teeth, lips or other oral mucosa - nerve damage due to positioning  - sore throat or hoarseness - Damage to heart, brain, nerves, lungs, other parts of body or loss of life  Patient voiced understanding and assent.)       Anesthesia Quick Evaluation

## 2023-11-27 ENCOUNTER — Encounter: Payer: Self-pay | Admitting: Ophthalmology

## 2023-12-01 ENCOUNTER — Encounter

## 2023-12-09 ENCOUNTER — Encounter: Payer: Self-pay | Admitting: Internal Medicine

## 2023-12-09 ENCOUNTER — Other Ambulatory Visit: Payer: Self-pay

## 2023-12-09 DIAGNOSIS — M797 Fibromyalgia: Secondary | ICD-10-CM

## 2023-12-09 MED ORDER — MELOXICAM 15 MG PO TABS: ORAL_TABLET | ORAL | 0 refills | Status: DC

## 2023-12-14 ENCOUNTER — Other Ambulatory Visit: Payer: Self-pay | Admitting: Internal Medicine

## 2023-12-14 DIAGNOSIS — I10 Essential (primary) hypertension: Secondary | ICD-10-CM

## 2023-12-14 DIAGNOSIS — Z1501 Genetic susceptibility to malignant neoplasm of breast: Secondary | ICD-10-CM

## 2023-12-14 DIAGNOSIS — F5101 Primary insomnia: Secondary | ICD-10-CM

## 2023-12-14 DIAGNOSIS — E039 Hypothyroidism, unspecified: Secondary | ICD-10-CM

## 2023-12-14 MED ORDER — LEVOTHYROXINE SODIUM 200 MCG PO TABS: 200.0000 ug | ORAL_TABLET | Freq: Every day | ORAL | 1 refills | Status: DC

## 2023-12-14 NOTE — Progress Notes (Unsigned)
 Date:  12/14/2023   Name:  [name]   DOB:  November 21, 1960   MRN:  [id number]   Chief Complaint: No chief complaint on file.  HPI  Review of Systems   Lab Results  Component Value Date   NA 143 07/20/2023   K 4.2 07/20/2023   CO2 25 07/20/2023   GLUCOSE 101 (H) 07/20/2023   BUN 17 07/20/2023   CREATININE 0.65 07/20/2023   CALCIUM 9.0 07/20/2023   EGFR 99 07/20/2023   GFRNONAA 104 11/21/2019   Lab Results  Component Value Date   CHOL 172 07/20/2023   HDL 47 07/20/2023   LDLCALC 80 07/20/2023   TRIG 273 (H) 07/20/2023   CHOLHDL 3.7 07/20/2023   Lab Results  Component Value Date   TSH 3.990 07/20/2023   Lab Results  Component Value Date   HGBA1C 5.6 11/18/2023   Lab Results  Component Value Date   WBC 10.3 11/07/2022   HGB 13.2 11/07/2022   HCT 39.5 11/07/2022   MCV 94 11/07/2022   PLT 304 11/07/2022   Lab Results  Component Value Date   ALT 15 07/20/2023   AST 20 07/20/2023   ALKPHOS 99 07/20/2023   BILITOT 0.2 07/20/2023   Lab Results  Component Value Date   VD25OH 17.4 (L) 11/07/2022     Patient Active Problem List   Diagnosis Date Noted   Vitamin D  deficiency 11/18/2023   Flexor tenosynovitis of finger 01/17/2022   S/P hysterectomy with oophorectomy 05/19/2019   BRCA2 gene mutation positive 03/03/2019   Type II diabetes mellitus with complication (HCC) 09/16/2016   Insomnia 09/16/2016   Arthritis of carpometacarpal (CMC) joint of thumb 05/29/2016   Venous insufficiency of both lower extremities 01/17/2016   Acquired hypothyroidism 11/10/2014   Essential (primary) hypertension 11/10/2014   Fibromyalgia syndrome 11/10/2014   Mixed hyperlipidemia 11/10/2014   Irritable bowel syndrome with diarrhea 11/10/2014   Asthma, mild intermittent 11/10/2014   BMI 40.0-44.9, adult (HCC) 11/10/2014   Chronic peptic ulcer 11/10/2014   Psoriasis 11/10/2014   Obstructive apnea 11/10/2014   Chest pain 07/19/2012   Environmental and seasonal allergies  09/08/2011    Allergies  Allergen Reactions   Sulfa Antibiotics Other (See Comments)    seizure   Amlodipine Swelling    Leg swelling   Furosemide Swelling   Lyrica [Pregabalin] Other (See Comments)    Dizziness and blurred vision   Hydrochlorothiazide Itching and Rash    Past Surgical History:  Procedure Laterality Date   APPENDECTOMY     BREAST BIOPSY Left 2007   benign, dr Dessa   CATARACT EXTRACTION W/PHACO Right 11/26/2023   Procedure: PHACOEMULSIFICATION, CATARACT, WITH IOL INSERTION 19.41, 01:43.6;  Surgeon: Enola Feliciano Hugger, MD;  [facility];  Service: Ophthalmology;  Laterality: Right;   CHOLECYSTECTOMY     CYSTOSCOPY  05/19/2019   Procedure: CYSTOSCOPY;  Surgeon: Victor Claudell SAUNDERS, MD;  Location: [facility];  Service: Gynecology;;   HYSTEROSCOPY WITH D & C N/A 12/14/2018   Procedure: DILATATION AND CURETTAGE /HYSTEROSCOPY WITH MYOSURE, VULVAR BIOPSY;  Surgeon: Victor Claudell SAUNDERS, MD;  Location: [facility];  Service: Gynecology;  Laterality: N/A;   SKIN BIOPSY Left    Back on L leg    SKIN CANCER EXCISION     BCCA of face   TONSILLECTOMY AND ADENOIDECTOMY     TOTAL LAPAROSCOPIC HYSTERECTOMY WITH SALPINGECTOMY N/A 05/19/2019   Procedure: TOTAL LAPAROSCOPIC HYSTERECTOMY WITH SALPINGECTOMY/ TLH/BSO;  Surgeon: Victor Claudell SAUNDERS, MD;  Location: [facility];  Service: Gynecology;  Laterality: N/A;    Social History   Tobacco Use   Smoking status: Never   Smokeless tobacco: Never  Vaping Use   Vaping status: Never Used  Substance Use Topics   Alcohol use: Yes    Alcohol/week: 2.0 standard drinks of alcohol    Types: 2 Standard drinks or equivalent per week    Comment: rarely   Drug use: No     Medication list has been reviewed and updated.  No outpatient medications have been marked as taking for the 12/14/23 encounter (Orders Only) with Kaitlyn [name], MD.       11/18/2023   10:59 AM 07/20/2023    3:03 PM 11/07/2022    2:44 PM  09/09/2021    1:27 PM  GAD 7 : Generalized Anxiety Score  Nervous, Anxious, on Edge 0 0 0 0  Control/stop worrying 0 0 0 0  Worry too much - different things 0 0 0 1  Trouble relaxing 2 0 0 0  Restless 0 0 0 0  Easily annoyed or irritable 2 2 0 1  Afraid - awful might happen 0 0 0 0  Total GAD 7 Score 4 2 0 2  Anxiety Difficulty Not difficult at all Not difficult at all Not difficult at all        11/18/2023   10:58 AM 07/20/2023    3:03 PM 03/31/2023   10:16 AM  Depression screen PHQ 2/9  Decreased Interest 2 2 0  Down, Depressed, Hopeless 0 0 0  PHQ - 2 Score 2 2 0  Altered sleeping 3 3 0  Tired, decreased energy 3 2 0  Change in appetite 0 1 0  Feeling bad or failure about yourself  0 0 0  Trouble concentrating 1 1 0  Moving slowly or fidgety/restless 0 0 0  Suicidal thoughts 0 0 0  PHQ-9 Score 9 9 0  Difficult doing work/chores Somewhat difficult Somewhat difficult Not difficult at all    BP Readings from Last 3 Encounters:  11/26/23 (!) 175/73  11/18/23 (!) 140/86  07/20/23 128/78    Physical Exam  Wt Readings from Last 3 Encounters:  11/26/23 215 lb (97.5 kg)  11/18/23 214 lb 2 oz (97.1 kg)  07/20/23 227 lb (103 kg)    There were no vitals taken for this visit.  Assessment and Plan:  Problem List Items Addressed This Visit   None   No follow-ups on file.    Kaitlyn HILARIO Justus, MD [facility] and Sports Medicine [place]

## 2023-12-17 NOTE — Telephone Encounter (Signed)
 Too soon for refill, refilled 08/04/23 for 90 and 1 RF.  Requested Prescriptions  Pending Prescriptions Disp Refills   doxazosin  (CARDURA ) 4 MG tablet [Pharmacy Med Name: Doxazosin  Mesylate 4mg  Tablet] 90 tablet 0    Sig: Take 1 tablet by mouth daily.     Cardiovascular:  Alpha Blockers Failed - 12/17/2023  9:27 AM      Failed - Last BP in normal range    BP Readings from Last 1 Encounters:  11/26/23 (!) 175/73         Passed - Valid encounter within last 6 months    Recent Outpatient Visits           4 weeks ago Type II diabetes mellitus with complication [facility])   [place] [facility] at [facility], Leita DEL, MD   5 months ago Type II diabetes mellitus with complication [facility])   [place] [facility] at [facility], Leita DEL, MD   3 years ago Encounter for medication review   [facility] [place]. [name], [name]       Future Appointments             In 3 months Justus, Leita DEL, MD [facility] Medicine at [facility], [facility]             tamoxifen  (NOLVADEX ) 20 MG tablet [Pharmacy Med Name: Tamoxifen  Citrate 20mg  Tablet] 90 tablet 0    Sig: Take 1 tablet by mouth daily.     Off-Protocol Failed - 12/17/2023  9:27 AM      Failed - Medication not assigned to a protocol, review manually.      Passed - Valid encounter within last 12 months    Recent Outpatient Visits           4 weeks ago Type II diabetes mellitus with complication [facility])   [place] [facility] at [facility], Leita DEL, MD   5 months ago Type II diabetes mellitus with complication [facility])   [place] [facility] at [facility], Leita DEL, MD   3 years ago Encounter for medication review   [place] [facility] [place]. [facility] Fleeta Tonia Garnette LITTIE, [name]       Future Appointments             In 3 months Justus, Leita DEL, MD [facility] Primary Care & Sports Medicine at [facility]             doxepin  (SINEQUAN ) 100 MG capsule [Pharmacy Med Name: Doxepin  Hydrochloride 100mg  Capsule] 90 capsule 0    Sig: Take 1 capsule by mouth at bedtime.     Psychiatry:  Antidepressants - Heterocyclics (TCAs) Passed - 12/17/2023  9:27 AM      Passed - Valid encounter within last 6 months    Recent Outpatient Visits           4 weeks ago Type II diabetes mellitus with complication [facility])   [place] [facility] at [facility], Leita DEL, MD   5 months ago Type II diabetes mellitus with complication [facility])   [place] [facility] Medicine at [facility], Leita DEL, MD   3 years ago Encounter for medication review  [facility] [place]. [facility] Fleeta Tonia Garnette LITTIE, [name]       Future Appointments             In 3 months Justus, Leita DEL, MD [facility] Primary Care & Sports Medicine at [facility], [facility]           Psychiatry:  Antidepressants - Heterocyclics (TCAs) - doxepin  Passed - 12/17/2023  9:27 AM      Passed - Valid encounter within last 12 months    Recent Outpatient Visits           4 weeks ago Type II diabetes mellitus with complication [facility])   [place] [facility] Medicine at [facility], Leita DEL, MD   5 months ago Type II diabetes mellitus with complication [place])   [place] [facility] & Sports Medicine at [facility], Leita DEL, MD   3 years ago Encounter for medication review   [place] [facility] [place]. [facility] Fleeta Tonia Garnette LITTIE, [name]       Future Appointments             In 3 months Justus, Leita DEL, MD [facility] at [facility], [facility]

## 2023-12-23 ENCOUNTER — Other Ambulatory Visit: Payer: Self-pay | Admitting: Internal Medicine

## 2023-12-23 DIAGNOSIS — I10 Essential (primary) hypertension: Secondary | ICD-10-CM

## 2023-12-23 DIAGNOSIS — F5101 Primary insomnia: Secondary | ICD-10-CM

## 2023-12-24 ENCOUNTER — Encounter: Payer: Self-pay | Admitting: Internal Medicine

## 2023-12-24 DIAGNOSIS — I10 Essential (primary) hypertension: Secondary | ICD-10-CM

## 2023-12-24 DIAGNOSIS — F5101 Primary insomnia: Secondary | ICD-10-CM

## 2023-12-25 ENCOUNTER — Other Ambulatory Visit: Payer: Self-pay | Admitting: Internal Medicine

## 2023-12-25 DIAGNOSIS — F5101 Primary insomnia: Secondary | ICD-10-CM

## 2023-12-25 MED ORDER — DOXEPIN HCL 10 MG PO CAPS: ORAL_CAPSULE | ORAL | 0 refills | Status: DC

## 2023-12-25 NOTE — Telephone Encounter (Signed)
 Requested Prescriptions  Pending Prescriptions Disp Refills   doxepin  (SINEQUAN ) 100 MG capsule [Pharmacy Med Name: Doxepin  Hydrochloride 100mg  Capsule] 90 capsule 0    Sig: Take 1 capsule by mouth at bedtime.     Psychiatry:  Antidepressants - Heterocyclics (TCAs) Passed - 12/25/2023  9:43 AM      Passed - Valid encounter within last 6 months    Recent Outpatient Visits           1 month ago Type II diabetes mellitus with complication [facility])   [name], [name], [name]   5 months ago Type II diabetes mellitus with complication [facility])   [name], [name], [name]   3 years ago Encounter for medication review   [name], [name]       Future Appointments             In 2 months Kaitlyn [name], [name] [facility] Primary Care & Sports Medicine at [facility], [facility]           Psychiatry:  Antidepressants - Heterocyclics (TCAs) - doxepin  Passed - 12/25/2023  9:43 AM      Passed - Valid encounter within last 12 months    Recent Outpatient Visits           1 month ago Type II diabetes mellitus with complication [facility])   [name], [name], [name]   5 months ago Type II diabetes mellitus with complication [facility])   [name], [name], [name]   3 years ago Encounter for medication review    [name], [name]       Future Appointments             In 2 months Kaitlyn, [name], [name] [facility] Primary [facility] & [facility] Medicine at [facility], [facility]             lisinopril  (ZESTRIL ) 40 MG tablet [Pharmacy Med Name: Lisinopril  40mg  Tablet] 90 tablet 0    Sig: Take 1 tablet by  mouth daily.     Cardiovascular:  ACE Inhibitors Failed - 12/25/2023  9:43 AM      Failed - Last BP in normal range    BP Readings from Last 1 Encounters:  11/26/23 (!) 175/73         Passed - Cr in normal range and within 180 days    Creatinine  Date Value Ref Range Status  08/21/2013 0.66 0.60 - 1.30 mg/dL Final   Creatinine, Ser  Date Value Ref Range Status  07/20/2023 0.65 0.57 - 1.00 mg/dL Final         Passed - K in normal range and within 180 days    Potassium  Date Value Ref Range Status  07/20/2023 4.2 3.5 - 5.2 mmol/L Final  08/21/2013 3.7 3.5 - 5.1 mmol/L Final         Passed - Patient is not pregnant      Passed - Valid encounter within last 6 months    Recent Outpatient Visits           1 month ago Type II diabetes  mellitus with complication [facility])   [name], [name], [name]   5 months ago Type II diabetes mellitus with complication [facility] Jones)   [name], [name], [name]   3 years ago Encounter for medication review   [name], [name]       Future Appointments             In 2 months Kaitlyn, [name], [name] [facility] Health Primary Care & Sports Medicine at [facility], [facility]

## 2023-12-25 NOTE — Telephone Encounter (Signed)
Please review medication refill  request

## 2023-12-25 NOTE — Telephone Encounter (Signed)
 Requested medication (s) are due for refill today: yes  Requested medication (s) are on the active medication list: yes  Last refill:  10/05/23  Future visit scheduled: yes  Notes to clinic: Unable to refill, Requires a override for dosage, routing for approval.     Requested Prescriptions  Pending Prescriptions Disp Refills   doxepin  (SINEQUAN ) 100 MG capsule [Pharmacy Med Name: Doxepin  Hydrochloride 100mg  Capsule] 90 capsule 0    Sig: Take 1 capsule by mouth at bedtime.     Psychiatry:  Antidepressants - Heterocyclics (TCAs) Passed - 12/25/2023  9:49 AM      Passed - Valid encounter within last 6 months    Recent Outpatient Visits           1 month ago Type II diabetes mellitus with complication [facility])   [place] [facility] & Sports Medicine at [facility], Leita DEL, MD   5 months ago Type II diabetes mellitus with complication [facility])   [place] [facility] Medicine at [facility], Leita DEL, MD   3 years ago Encounter for medication review   [place] [facility] [place]. [facility] Fleeta Tonia Garnette LITTIE, [name]       Future Appointments             In 2 months [name], MD [facility] Primary Care & Sports Medicine at [facility], [facility]           Psychiatry:  Antidepressants - Heterocyclics (TCAs) - doxepin  Passed - 12/25/2023  9:49 AM      Passed - Valid encounter within last 12 months    Recent Outpatient Visits           1 month ago Type II diabetes mellitus with complication [facility])   [place] [facility] Sports Medicine at [facility], Leita DEL, MD   5 months ago Type II diabetes mellitus with complication [facility])   [place] [facility] Sports Medicine at [facility], Leita DEL, MD   3 years ago Encounter for medication review   [place] [facility] [place]. [facility] Fleeta Tonia Garnette LITTIE, [name]        Future Appointments             In 2 months [name], MD [facility] Health Primary Care & Sports Medicine at [facility], [facility]            Signed Prescriptions Disp Refills   lisinopril  (ZESTRIL ) 40 MG tablet 90 tablet 0    Sig: Take 1 tablet by mouth daily.     Cardiovascular:  ACE Inhibitors Failed - 12/25/2023  9:49 AM      Failed - Last BP in normal range    BP Readings from Last 1 Encounters:  11/26/23 (!) 175/73         Passed - Cr in normal range and within 180 days    Creatinine  Date Value Ref Range Status  08/21/2013 0.66 0.60 - 1.30 mg/dL Final   Creatinine, Ser  Date Value Ref Range Status  07/20/2023 0.65 0.57 - 1.00 mg/dL Final         Passed - K in normal range and within 180 days    Potassium  Date Value Ref Range Status  07/20/2023 4.2 3.5 - 5.2 mmol/L Final  08/21/2013 3.7 3.5 - 5.1 mmol/L Final  Passed - Patient is not pregnant      Passed - Valid encounter within last 6 months    Recent Outpatient Visits           1 month ago Type II diabetes mellitus with complication [facility])   [place] [facility] Care & Sports Medicine at [facility], Leita DEL, MD   5 months ago Type II diabetes mellitus with complication [facility])   [place] [facility] Medicine at [facility], Leita DEL, MD   3 years ago Encounter for medication review   [place] [facility] [place]. [facility] Fleeta Tonia Garnette LITTIE, [name]       Future Appointments             In 2 months Justus, Leita DEL, MD [facility] Primary Care & Sports Medicine at [facility], [facility]

## 2023-12-25 NOTE — Progress Notes (Unsigned)
 Date:  12/25/2023   Name:  [name]   DOB:  [birth year]   MRN:  [id number]   Chief Complaint: No chief complaint on file.  HPI  Review of Systems   Lab Results  Component Value Date   NA 143 07/20/2023   K 4.2 07/20/2023   CO2 25 07/20/2023   GLUCOSE 101 (H) 07/20/2023   BUN 17 07/20/2023   CREATININE 0.65 07/20/2023   CALCIUM 9.0 07/20/2023   EGFR 99 07/20/2023   GFRNONAA 104 11/21/2019   Lab Results  Component Value Date   CHOL 172 07/20/2023   HDL 47 07/20/2023   LDLCALC 80 07/20/2023   TRIG 273 (H) 07/20/2023   CHOLHDL 3.7 07/20/2023   Lab Results  Component Value Date   TSH 3.990 07/20/2023   Lab Results  Component Value Date   HGBA1C 5.6 11/18/2023   Lab Results  Component Value Date   WBC 10.3 11/07/2022   HGB 13.2 11/07/2022   HCT 39.5 11/07/2022   MCV 94 11/07/2022   PLT 304 11/07/2022   Lab Results  Component Value Date   ALT 15 07/20/2023   AST 20 07/20/2023   ALKPHOS 99 07/20/2023   BILITOT 0.2 07/20/2023   Lab Results  Component Value Date   VD25OH 17.4 (L) 11/07/2022     Patient Active Problem List   Diagnosis Date Noted   Vitamin D  deficiency 11/18/2023   Flexor tenosynovitis of finger 01/17/2022   S/P hysterectomy with oophorectomy 05/19/2019   BRCA2 gene mutation positive 03/03/2019   Type II diabetes mellitus with complication (HCC) 09/16/2016   Insomnia 09/16/2016   Arthritis of carpometacarpal (CMC) joint of thumb 05/29/2016   Venous insufficiency of both lower extremities 01/17/2016   Acquired hypothyroidism 11/10/2014   Essential (primary) hypertension 11/10/2014   Fibromyalgia syndrome 11/10/2014   Mixed hyperlipidemia 11/10/2014   Irritable bowel syndrome with diarrhea 11/10/2014   Asthma, mild intermittent 11/10/2014   BMI 40.0-44.9, adult (HCC) 11/10/2014   Chronic peptic ulcer 11/10/2014   Psoriasis 11/10/2014   Obstructive apnea 11/10/2014   Chest pain 07/19/2012   Environmental and seasonal  allergies 09/08/2011    Allergies  Allergen Reactions   Sulfa Antibiotics Other (See Comments)    seizure   Amlodipine Swelling    Leg swelling   Furosemide Swelling   Lyrica [Pregabalin] Other (See Comments)    Dizziness and blurred vision   Hydrochlorothiazide Itching and Rash    Past Surgical History:  Procedure Laterality Date   APPENDECTOMY     BREAST BIOPSY Left 2007   benign, dr Dessa   CATARACT EXTRACTION W/PHACO Right 11/26/2023   Procedure: PHACOEMULSIFICATION, CATARACT, WITH IOL INSERTION 19.41, 01:43.6;  Surgeon: Enola Feliciano Hugger, MD;  Location: [facility];  Service: Ophthalmology;  Laterality: Right;   CHOLECYSTECTOMY     CYSTOSCOPY  05/19/2019   Procedure: CYSTOSCOPY;  Surgeon: Victor Claudell SAUNDERS, MD;  Location: [facility];  Service: Gynecology;;   HYSTEROSCOPY WITH D & C N/A 12/14/2018   Procedure: DILATATION AND CURETTAGE /HYSTEROSCOPY WITH MYOSURE, VULVAR BIOPSY;  Surgeon: Victor Claudell SAUNDERS, MD;  Location: [facility];  Service: Gynecology;  Laterality: N/A;   SKIN BIOPSY Left    Back on L leg    SKIN CANCER EXCISION     BCCA of face   TONSILLECTOMY AND ADENOIDECTOMY     TOTAL LAPAROSCOPIC HYSTERECTOMY WITH SALPINGECTOMY N/A 05/19/2019   Procedure: TOTAL LAPAROSCOPIC HYSTERECTOMY WITH SALPINGECTOMY/ TLH/BSO;  Surgeon: Victor Claudell SAUNDERS, MD;  Location: [facility];  Service: Gynecology;  Laterality: N/A;    Social History   Tobacco Use   Smoking status: Never   Smokeless tobacco: Never  Vaping Use   Vaping status: Never Used  Substance Use Topics   Alcohol use: Yes    Alcohol/week: 2.0 standard drinks of alcohol    Types: 2 Standard drinks or equivalent per week    Comment: rarely   Drug use: No     Medication list has been reviewed and updated.  No outpatient medications have been marked as taking for the 12/25/23 encounter (Orders Only) with [name], MD.       11/18/2023   10:59 AM 07/20/2023    3:03 PM 11/07/2022     2:44 PM 09/09/2021    1:27 PM  GAD 7 : Generalized Anxiety Score  Nervous, Anxious, on Edge 0 0 0 0  Control/stop worrying 0 0 0 0  Worry too much - different things 0 0 0 1  Trouble relaxing 2 0 0 0  Restless 0 0 0 0  Easily annoyed or irritable 2 2 0 1  Afraid - awful might happen 0 0 0 0  Total GAD 7 Score 4 2 0 2  Anxiety Difficulty Not difficult at all Not difficult at all Not difficult at all        11/18/2023   10:58 AM 07/20/2023    3:03 PM 03/31/2023   10:16 AM  Depression screen PHQ 2/9  Decreased Interest 2 2 0  Down, Depressed, Hopeless 0 0 0  PHQ - 2 Score 2 2 0  Altered sleeping 3 3 0  Tired, decreased energy 3 2 0  Change in appetite 0 1 0  Feeling bad or failure about yourself  0 0 0  Trouble concentrating 1 1 0  Moving slowly or fidgety/restless 0 0 0  Suicidal thoughts 0 0 0  PHQ-9 Score 9 9 0  Difficult doing work/chores Somewhat difficult Somewhat difficult Not difficult at all    BP Readings from Last 3 Encounters:  11/26/23 (!) 175/73  11/18/23 (!) 140/86  07/20/23 128/78    Physical Exam  Wt Readings from Last 3 Encounters:  11/26/23 215 lb (97.5 kg)  11/18/23 214 lb 2 oz (97.1 kg)  07/20/23 227 lb (103 kg)    There were no vitals taken for this visit.  Assessment and Plan:  Problem List Items Addressed This Visit   None   No follow-ups on file.    Leita HILARIO Justus, MD [facility] Health Primary Care and Sports Medicine [place]

## 2023-12-30 ENCOUNTER — Ambulatory Visit
Admission: RE | Admit: 2023-12-30 | Discharge: 2023-12-30 | Disposition: A | Discharge status: Home / Self Care | Source: Ambulatory Visit | Attending: Internal Medicine | Admitting: Internal Medicine

## 2023-12-30 DIAGNOSIS — Z1231 Encounter for screening mammogram for malignant neoplasm of breast: Secondary | ICD-10-CM | POA: Insufficient documentation

## 2024-01-18 ENCOUNTER — Other Ambulatory Visit: Payer: Self-pay | Admitting: Internal Medicine

## 2024-01-18 DIAGNOSIS — F5101 Primary insomnia: Secondary | ICD-10-CM

## 2024-01-18 MED ORDER — DOXEPIN HCL 100 MG PO CAPS: 100.0000 mg | ORAL_CAPSULE | Freq: Every day | ORAL | 1 refills | Status: AC

## 2024-01-18 NOTE — Progress Notes (Unsigned)
 Date:  01/18/2024   Name:  [name]   DOB:  1960/10/22   MRN:  [id number]   Chief Complaint: No chief complaint on file.  HPI  Review of Systems   Lab Results  Component Value Date   NA 143 07/20/2023   K 4.2 07/20/2023   CO2 25 07/20/2023   GLUCOSE 101 (H) 07/20/2023   BUN 17 07/20/2023   CREATININE 0.65 07/20/2023   CALCIUM 9.0 07/20/2023   EGFR 99 07/20/2023   GFRNONAA 104 11/21/2019   Lab Results  Component Value Date   CHOL 172 07/20/2023   HDL 47 07/20/2023   LDLCALC 80 07/20/2023   TRIG 273 (H) 07/20/2023   CHOLHDL 3.7 07/20/2023   Lab Results  Component Value Date   TSH 3.990 07/20/2023   Lab Results  Component Value Date   HGBA1C 5.6 11/18/2023   Lab Results  Component Value Date   WBC 10.3 11/07/2022   HGB 13.2 11/07/2022   HCT 39.5 11/07/2022   MCV 94 11/07/2022   PLT 304 11/07/2022   Lab Results  Component Value Date   ALT 15 07/20/2023   AST 20 07/20/2023   ALKPHOS 99 07/20/2023   BILITOT 0.2 07/20/2023   Lab Results  Component Value Date   VD25OH 17.4 (L) 11/07/2022     Patient Active Problem List   Diagnosis Date Noted   Vitamin D  deficiency 11/18/2023   Flexor tenosynovitis of finger 01/17/2022   S/P hysterectomy with oophorectomy 05/19/2019   BRCA2 gene mutation positive 03/03/2019   Type II diabetes mellitus with complication (HCC) 09/16/2016   Insomnia 09/16/2016   Arthritis of carpometacarpal (CMC) joint of thumb 05/29/2016   Venous insufficiency of both lower extremities 01/17/2016   Acquired hypothyroidism 11/10/2014   Essential (primary) hypertension 11/10/2014   Fibromyalgia syndrome 11/10/2014   Mixed hyperlipidemia 11/10/2014   Irritable bowel syndrome with diarrhea 11/10/2014   Asthma, mild intermittent 11/10/2014   BMI 40.0-44.9, adult (HCC) 11/10/2014   Chronic peptic ulcer 11/10/2014   Psoriasis 11/10/2014   Obstructive apnea 11/10/2014   Chest pain 07/19/2012   Environmental and seasonal  allergies 09/08/2011    Allergies  Allergen Reactions   Sulfa Antibiotics Other (See Comments)    seizure   Amlodipine Swelling    Leg swelling   Furosemide Swelling   Lyrica [Pregabalin] Other (See Comments)    Dizziness and blurred vision   Hydrochlorothiazide Itching and Rash    Past Surgical History:  Procedure Laterality Date   APPENDECTOMY     BREAST BIOPSY Left 2007   benign, dr Dessa   CATARACT EXTRACTION W/PHACO Right 11/26/2023   Procedure: PHACOEMULSIFICATION, CATARACT, WITH IOL INSERTION 19.41, 01:43.6;  Surgeon: Enola Feliciano Hugger, MD;  Location: [facility];  Service: Ophthalmology;  Laterality: Right;   CHOLECYSTECTOMY     CYSTOSCOPY  05/19/2019   Procedure: CYSTOSCOPY;  Surgeon: Victor Claudell SAUNDERS, MD;  Location: [facility];  Service: Gynecology;;   HYSTEROSCOPY WITH D & C N/A 12/14/2018   Procedure: DILATATION AND CURETTAGE /HYSTEROSCOPY WITH MYOSURE, VULVAR BIOPSY;  Surgeon: Victor Claudell SAUNDERS, MD;  Location: [facility];  Service: Gynecology;  Laterality: N/A;   SKIN BIOPSY Left    Back on L leg    SKIN CANCER EXCISION     BCCA of face   TONSILLECTOMY AND ADENOIDECTOMY     TOTAL LAPAROSCOPIC HYSTERECTOMY WITH SALPINGECTOMY N/A 05/19/2019   Procedure: TOTAL LAPAROSCOPIC HYSTERECTOMY WITH SALPINGECTOMY/ TLH/BSO;  Surgeon: Victor Claudell SAUNDERS, MD;  Location: [facility];  Service: Gynecology;  Laterality: N/A;    Social History   Tobacco Use   Smoking status: Never   Smokeless tobacco: Never  Vaping Use   Vaping status: Never Used  Substance Use Topics   Alcohol use: Yes    Alcohol/week: 2.0 standard drinks of alcohol    Types: 2 Standard drinks or equivalent per week    Comment: rarely   Drug use: No     Medication list has been reviewed and updated.  No outpatient medications have been marked as taking for the 01/18/24 encounter (Orders Only) with Kaitlyn Leita DEL, MD.       11/18/2023   10:59 AM 07/20/2023    3:03 PM 11/07/2022     2:44 PM 09/09/2021    1:27 PM  GAD 7 : Generalized Anxiety Score  Nervous, Anxious, on Edge 0 0 0 0  Control/stop worrying 0 0 0 0  Worry too much - different things 0 0 0 1  Trouble relaxing 2 0 0 0  Restless 0 0 0 0  Easily annoyed or irritable 2 2 0 1  Afraid - awful might happen 0 0 0 0  Total GAD 7 Score 4 2 0 2  Anxiety Difficulty Not difficult at all Not difficult at all Not difficult at all        11/18/2023   10:58 AM 07/20/2023    3:03 PM 03/31/2023   10:16 AM  Depression screen PHQ 2/9  Decreased Interest 2 2 0  Down, Depressed, Hopeless 0 0 0  PHQ - 2 Score 2 2 0  Altered sleeping 3 3 0  Tired, decreased energy 3 2 0  Change in appetite 0 1 0  Feeling bad or failure about yourself  0 0 0  Trouble concentrating 1 1 0  Moving slowly or fidgety/restless 0 0 0  Suicidal thoughts 0 0 0  PHQ-9 Score 9 9 0  Difficult doing work/chores Somewhat difficult Somewhat difficult Not difficult at all    BP Readings from Last 3 Encounters:  11/26/23 (!) 175/73  11/18/23 (!) 140/86  07/20/23 128/78    Physical Exam  Wt Readings from Last 3 Encounters:  11/26/23 215 lb (97.5 kg)  11/18/23 214 lb 2 oz (97.1 kg)  07/20/23 227 lb (103 kg)    There were no vitals taken for this visit.  Assessment and Plan:  Problem List Items Addressed This Visit   None   No follow-ups on file.    Leita HILARIO Justus, MD [facility] Health Primary Care and Sports Medicine [place]

## 2024-01-18 NOTE — Telephone Encounter (Signed)
 Please review patient's response.

## 2024-01-20 ENCOUNTER — Other Ambulatory Visit: Payer: Self-pay | Admitting: Internal Medicine

## 2024-01-20 DIAGNOSIS — Z1509 Genetic susceptibility to other malignant neoplasm: Secondary | ICD-10-CM

## 2024-01-20 DIAGNOSIS — K277 Chronic peptic ulcer, site unspecified, without hemorrhage or perforation: Secondary | ICD-10-CM

## 2024-01-20 DIAGNOSIS — Z1501 Genetic susceptibility to malignant neoplasm of breast: Secondary | ICD-10-CM

## 2024-01-22 NOTE — Telephone Encounter (Signed)
 Requested Prescriptions  Pending Prescriptions Disp Refills   tamoxifen  (NOLVADEX ) 20 MG tablet 90 tablet 0    Sig: Take 1 tablet by mouth daily.     Off-Protocol Failed - 01/22/2024  2:02 PM      Failed - Medication not assigned to a protocol, review manually.      Passed - Valid encounter within last 12 months    Recent Outpatient Visits           2 months ago Type II diabetes mellitus with complication [facility])   [place] Primary Care & Sports Medicine at [facility], Leita DEL, MD   6 months ago Type II diabetes mellitus with complication [facility])   [place] Primary Care & Sports Medicine at [facility], Leita DEL, MD   3 years ago Encounter for medication review   [place] [facility] Dept Of [place]. [facility] Fleeta Tonia Garnette LITTIE, [name]       Future Appointments             In 1 month Justus, Leita DEL, MD [facility] Primary Care & Sports Medicine at [facility], [facility]             esomeprazole  (NEXIUM ) 40 MG capsule 90 capsule 0    Sig: Take 1 capsule by mouth daily before supper at 5 PM.     Gastroenterology: Proton Pump Inhibitors 2 Passed - 01/22/2024  2:02 PM      Passed - ALT in normal range and within 360 days    ALT  Date Value Ref Range Status  07/20/2023 15 0 - 32 IU/L Final   SGPT (ALT)  Date Value Ref Range Status  03/07/2013 26 12 - 78 U/L Final         Passed - AST in normal range and within 360 days    AST  Date Value Ref Range Status  07/20/2023 20 0 - 40 IU/L Final   SGOT(AST)  Date Value Ref Range Status  03/07/2013 21 15 - 37 Unit/L Final         Passed - Valid encounter within last 12 months    Recent Outpatient Visits           2 months ago Type II diabetes mellitus with complication [facility])   [place] Primary Care & Sports Medicine at [facility], Leita DEL, MD   6 months ago Type II diabetes mellitus with complication [facility])   [place] Primary Care &  Sports Medicine at [facility], Leita DEL, MD   3 years ago Encounter for medication review    [facility] [place] [facility] Dept Of [place]. [facility] Fleeta Tonia Garnette LITTIE, [name]       Future Appointments             In 1 month Justus, Leita DEL, MD [facility] Health Primary Care & Sports Medicine at [facility], [facility]

## 2024-01-29 ENCOUNTER — Other Ambulatory Visit: Payer: Self-pay | Admitting: Internal Medicine

## 2024-01-29 DIAGNOSIS — E118 Type 2 diabetes mellitus with unspecified complications: Secondary | ICD-10-CM

## 2024-02-01 NOTE — Telephone Encounter (Signed)
 Requested medications are due for refill today.  yes  Requested medications are on the active medications list.  yes  Last refill. 11/03/2023 #180 0 rf  Future visit scheduled.   yes  Notes to clinic.  Labs are expired.    Requested Prescriptions  Pending Prescriptions Disp Refills   metFORMIN  (GLUCOPHAGE -XR) 500 MG 24 hr tablet [Pharmacy Med Name: metFORMIN  HCl ER 500 MG Oral Tablet Extended Release 24 Hour] 180 tablet 0    Sig: TAKE 1 TABLET BY MOUTH TWICE DAILY WITH A MEAL     Endocrinology:  Diabetes - Biguanides Failed - 02/01/2024 12:37 PM      Failed - B12 Level in normal range and within 720 days    No results found for: VITAMINB12       Failed - CBC within normal limits and completed in the last 12 months    WBC  Date Value Ref Range Status  11/07/2022 10.3 3.4 - 10.8 x10E3/uL Final  05/19/2019 17.0 (H) 4.0 - 10.5 K/uL Final   RBC  Date Value Ref Range Status  11/07/2022 4.21 3.77 - 5.28 x10E6/uL Final  05/19/2019 4.04 3.87 - 5.11 MIL/uL Final   Hemoglobin  Date Value Ref Range Status  11/07/2022 13.2 11.1 - 15.9 g/dL Final   Hematocrit  Date Value Ref Range Status  11/07/2022 39.5 34.0 - 46.6 % Final   MCHC  Date Value Ref Range Status  11/07/2022 33.4 31.5 - 35.7 g/dL Final  87/89/7979 66.2 30.0 - 36.0 g/dL Final   [facility]  Date Value Ref Range Status  11/07/2022 31.4 26.6 - 33.0 pg Final  05/19/2019 31.2 26.0 - 34.0 pg Final   MCV  Date Value Ref Range Status  11/07/2022 94 79 - 97 fL Final  08/21/2013 95 80 - 100 fL Final   No results found for: PLTCOUNTKUC, LABPLAT, POCPLA RDW  Date Value Ref Range Status  11/07/2022 12.4 11.7 - 15.4 % Final  08/21/2013 13.8 11.5 - 14.5 % Final         Passed - Cr in normal range and within 360 days    Creatinine  Date Value Ref Range Status  08/21/2013 0.66 0.60 - 1.30 mg/dL Final   Creatinine, Ser  Date Value Ref Range Status  07/20/2023 0.65 0.57 - 1.00 mg/dL Final         Passed - HBA1C is  between 0 and 7.9 and within 180 days    Hemoglobin A1C  Date Value Ref Range Status  11/18/2023 5.6 4.0 - 5.6 % Final   Hgb A1c MFr Bld  Date Value Ref Range Status  07/20/2023 6.3 (H) 4.8 - 5.6 % Final    Comment:             Prediabetes: 5.7 - 6.4          Diabetes: >6.4          Glycemic control for adults with diabetes: <7.0          Passed - eGFR in normal range and within 360 days    EGFR (African American)  Date Value Ref Range Status  08/21/2013 >60  Final   GFR calc Af Amer  Date Value Ref Range Status  11/21/2019 119 >59 mL/min/1.73 Final    Comment:    **[facility] currently reports eGFR in compliance with the current**   recommendations of the [employer]. [facility] will   update reporting as new guidelines are published from the NKF-ASN   Task force.  EGFR (Non-African Amer.)  Date Value Ref Range Status  08/21/2013 >60  Final    Comment:    eGFR values <17mL/min/1.73 m2 may be an indication of chronic kidney disease (CKD). Calculated eGFR is useful in patients with stable renal function. The eGFR calculation will not be reliable in acutely ill patients when serum creatinine is changing rapidly. It is not useful in  patients on dialysis. The eGFR calculation may not be applicable to patients at the low and high extremes of body sizes, pregnant women, and vegetarians.    GFR calc non Af Amer  Date Value Ref Range Status  11/21/2019 104 >59 mL/min/1.73 Final   eGFR  Date Value Ref Range Status  07/20/2023 99 >59 mL/min/1.73 Final         Passed - Valid encounter within last 6 months    Recent Outpatient Visits           2 months ago Type II diabetes mellitus with complication [facility])   [place] [facility] at [facility], Leita DEL, MD   6 months ago Type II diabetes mellitus with complication [facility])   [place] [facility] at [facility], Leita DEL, MD   3 years ago  Encounter for medication review   [place] [facility] Dept Of [place]. [facility] Fleeta Tonia Garnette LITTIE, [name]       Future Appointments             In 1 month Justus, Leita DEL, MD [facility] Primary [facility] & Sports Medicine at [facility], [facility]

## 2024-02-05 ENCOUNTER — Other Ambulatory Visit: Payer: Self-pay

## 2024-02-05 ENCOUNTER — Encounter: Payer: Self-pay | Admitting: Internal Medicine

## 2024-02-05 ENCOUNTER — Other Ambulatory Visit: Payer: Self-pay | Admitting: Internal Medicine

## 2024-02-05 DIAGNOSIS — M797 Fibromyalgia: Secondary | ICD-10-CM

## 2024-02-05 DIAGNOSIS — I10 Essential (primary) hypertension: Secondary | ICD-10-CM

## 2024-02-05 MED ORDER — NEBIVOLOL HCL 20 MG PO TABS: 1.0000 | ORAL_TABLET | Freq: Every day | ORAL | 0 refills | Status: DC

## 2024-02-08 NOTE — Telephone Encounter (Signed)
 Requested medication (s) are due for refill today - yes  Requested medication (s) are on the active medication list -yes  Future visit scheduled -yes  Last refill: 07/20/23 #270 1RF  Notes to clinic: non delegated Rx  Requested Prescriptions  Pending Prescriptions Disp Refills   cyclobenzaprine  (FLEXERIL ) 10 MG tablet 270 tablet 0    Sig: Take 1 tablet by mouth three times daily.     Not Delegated - Analgesics:  Muscle Relaxants Failed - 02/08/2024  5:33 AM      Failed - This refill cannot be delegated      Passed - Valid encounter within last 6 months    Recent Outpatient Visits           2 months ago Type II diabetes mellitus with complication [facility])   [place] [facility] Medicine at [facility], Leita DEL, MD   6 months ago Type II diabetes mellitus with complication [facility])   [place] [facility] Medicine at [facility], Leita DEL, MD   3 years ago Encounter for medication review   [place] [facility] Dept Of [place]. [facility] Fleeta Tonia Garnette LITTIE, [name]       Future Appointments             In 1 month Justus, Leita DEL, MD [facility] Primary Care & Sports Medicine at [facility], [phone] [place]               Requested Prescriptions  Pending Prescriptions Disp Refills   cyclobenzaprine  (FLEXERIL ) 10 MG tablet 270 tablet 0    Sig: Take 1 tablet by mouth three times daily.     Not Delegated - Analgesics:  Muscle Relaxants Failed - 02/08/2024  5:33 AM      Failed - This refill cannot be delegated      Passed - Valid encounter within last 6 months    Recent Outpatient Visits           2 months ago Type II diabetes mellitus with complication [facility])   [place] [facility] Sports Medicine at [facility], Leita DEL, MD   6 months ago Type II diabetes mellitus with complication [facility])   [place] Primary Care & Sports Medicine at [facility], Leita DEL, MD   3 years ago Encounter for medication review   [place] [facility] Dept Of [place]. [facility] Fleeta Tonia Garnette LITTIE, [name]       Future Appointments             In 1 month Justus, Leita DEL, MD [facility] Primary Care & Sports Medicine at [facility], [phone] [place]

## 2024-02-09 DIAGNOSIS — R519 Headache, unspecified: Secondary | ICD-10-CM | POA: Diagnosis not present

## 2024-02-09 DIAGNOSIS — R110 Nausea: Secondary | ICD-10-CM | POA: Diagnosis not present

## 2024-02-09 DIAGNOSIS — R42 Dizziness and giddiness: Secondary | ICD-10-CM | POA: Diagnosis not present

## 2024-02-09 DIAGNOSIS — Z77120 Contact with and (suspected) exposure to mold (toxic): Secondary | ICD-10-CM | POA: Diagnosis not present

## 2024-02-09 DIAGNOSIS — G8929 Other chronic pain: Secondary | ICD-10-CM | POA: Diagnosis not present

## 2024-02-14 ENCOUNTER — Other Ambulatory Visit: Payer: Self-pay | Admitting: Internal Medicine

## 2024-02-14 DIAGNOSIS — M797 Fibromyalgia: Secondary | ICD-10-CM

## 2024-02-15 NOTE — Telephone Encounter (Signed)
 Requested Prescriptions  Refused Prescriptions Disp Refills   meloxicam  (MOBIC ) 15 MG tablet 90 tablet 0    Sig: Take 1 tablet by mouth daily.     Analgesics:  COX2 Inhibitors Failed - 02/15/2024  5:55 PM      Failed - Manual Review: Labs are only required if the patient has taken medication for more than 8 weeks.      Failed - HGB in normal range and within 360 days    Hemoglobin  Date Value Ref Range Status  11/07/2022 13.2 11.1 - 15.9 g/dL Final         Failed - HCT in normal range and within 360 days    Hematocrit  Date Value Ref Range Status  11/07/2022 39.5 34.0 - 46.6 % Final         Passed - Cr in normal range and within 360 days    Creatinine  Date Value Ref Range Status  08/21/2013 0.66 0.60 - 1.30 mg/dL Final   Creatinine, Ser  Date Value Ref Range Status  07/20/2023 0.65 0.57 - 1.00 mg/dL Final         Passed - AST in normal range and within 360 days    AST  Date Value Ref Range Status  07/20/2023 20 0 - 40 IU/L Final   SGOT(AST)  Date Value Ref Range Status  03/07/2013 21 15 - 37 Unit/L Final         Passed - ALT in normal range and within 360 days    ALT  Date Value Ref Range Status  07/20/2023 15 0 - 32 IU/L Final   SGPT (ALT)  Date Value Ref Range Status  03/07/2013 26 12 - 78 U/L Final         Passed - eGFR is 30 or above and within 360 days    EGFR (African American)  Date Value Ref Range Status  08/21/2013 >60  Final   GFR calc Af Amer  Date Value Ref Range Status  11/21/2019 119 >59 mL/min/1.73 Final    Comment:    **[facility] currently reports eGFR in compliance with the current**   recommendations of the [employer]. [facility] will   update reporting as new guidelines are published from the NKF-ASN   Task force.    EGFR (Non-African Amer.)  Date Value Ref Range Status  08/21/2013 >60  Final    Comment:    eGFR values <67mL/min/1.73 m2 may be an indication of chronic kidney disease (CKD). Calculated eGFR is useful  in patients with stable renal function. The eGFR calculation will not be reliable in acutely ill patients when serum creatinine is changing rapidly. It is not useful in  patients on dialysis. The eGFR calculation may not be applicable to patients at the low and high extremes of body sizes, pregnant women, and vegetarians.    GFR calc non Af Amer  Date Value Ref Range Status  11/21/2019 104 >59 mL/min/1.73 Final   eGFR  Date Value Ref Range Status  07/20/2023 99 >59 mL/min/1.73 Final         Passed - Patient is not pregnant      Passed - Valid encounter within last 12 months    Recent Outpatient Visits           2 months ago Type II diabetes mellitus with complication [facility])   [place] [facility] Medicine at [facility], Leita DEL, MD   7 months ago Type II diabetes mellitus  with complication [facility])   [place] [facility] Medicine at [facility], Laura H, MD   3 years ago Encounter for medication review   [place] [facility] Dept Of [place]. [facility] Fleeta Tonia Garnette LITTIE, [name]       Future Appointments             In 1 month Justus, Leita DEL, MD [facility] Primary Care & Sports Medicine at [place], [phone] [place]

## 2024-03-03 ENCOUNTER — Other Ambulatory Visit: Payer: Self-pay | Admitting: Internal Medicine

## 2024-03-03 DIAGNOSIS — I10 Essential (primary) hypertension: Secondary | ICD-10-CM

## 2024-03-03 DIAGNOSIS — F5101 Primary insomnia: Secondary | ICD-10-CM

## 2024-03-04 NOTE — Telephone Encounter (Signed)
 Requested Prescriptions  Pending Prescriptions Disp Refills   lisinopril  (ZESTRIL ) 40 MG tablet [Pharmacy Med Name: Lisinopril  40mg  Tablet] 90 tablet 0    Sig: Take 1 tablet by mouth daily.     Cardiovascular:  ACE Inhibitors Failed - 03/04/2024  2:34 PM      Failed - Cr in normal range and within 180 days    Creatinine  Date Value Ref Range Status  08/21/2013 0.66 0.60 - 1.30 mg/dL Final   Creatinine, Ser  Date Value Ref Range Status  07/20/2023 0.65 0.57 - 1.00 mg/dL Final         Failed - K in normal range and within 180 days    Potassium  Date Value Ref Range Status  07/20/2023 4.2 3.5 - 5.2 mmol/L Final  08/21/2013 3.7 3.5 - 5.1 mmol/L Final         Failed - Last BP in normal range    BP Readings from Last 1 Encounters:  11/26/23 (!) 175/73         Passed - Patient is not pregnant      Passed - Valid encounter within last 6 months    Recent Outpatient Visits           3 months ago Type II diabetes mellitus with complication (HCC)   [place] [facility] Care & Sports Medicine at [facility], Leita DEL, MD   7 months ago Type II diabetes mellitus with complication [facility])   [place] Primary Care & Sports Medicine at [place] [facility], Leita DEL, MD   3 years ago Encounter for medication review   [place] [facility] [place] [facility] Dept Of [place]. [facility] Fleeta Tonia Garnette LITTIE, [name]       Future Appointments             In 2 weeks [name], MD [facility] Primary Care & Sports Medicine at [facility], [phone] [place]             doxepin  (SINEQUAN ) 10 MG capsule 90 capsule 0    Sig: Take 1 capsule by mouth at bedtime.     Psychiatry:  Antidepressants - Heterocyclics (TCAs) Passed - 03/04/2024  2:34 PM      Passed - Valid encounter within last 6 months    Recent Outpatient Visits           3 months ago Type II diabetes mellitus with complication [facility])   [place] Primary Care & Sports Medicine at  [facility], Leita DEL, MD   7 months ago Type II diabetes mellitus with complication [facility])   [place] Primary Care & Sports Medicine at [facility], Leita DEL, MD   3 years ago Encounter for medication review    [facility] - A Dept Of [place]. [facility] Fleeta Tonia Garnette LITTIE, [name]       Future Appointments             In 2 weeks [name], MD [facility] Primary Care & Sports Medicine at [facility], [phone] [place]           Psychiatry:  Antidepressants - Heterocyclics (TCAs) - doxepin  Passed - 03/04/2024  2:34 PM      Passed - Valid encounter within last 12 months    Recent Outpatient Visits           3 months ago Type II diabetes mellitus with complication (HCC)  [facility] at [facility], Leita DEL, MD   7 months ago Type II diabetes mellitus with complication [facility])   [place] Primary Care & Sports Medicine at [facility], Leita DEL, MD   3 years ago Encounter for medication review   [place] [facility] - A Dept Of [place]. [facility] Fleeta Tonia Garnette LITTIE, [name]       Future Appointments             In 2 weeks Justus, Leita DEL, MD [facility] Primary Care & Sports Medicine at [facility], [phone] [place]

## 2024-03-04 NOTE — Telephone Encounter (Signed)
 Requested medications are due for refill today.  yes  Requested medications are on the active medications list.  yes  Last refill. 12/25/2023 #90 0 rf  Future visit scheduled.   yes  Notes to clinic.  Labs are expired.    Requested Prescriptions  Pending Prescriptions Disp Refills   lisinopril  (ZESTRIL ) 40 MG tablet [Pharmacy Med Name: Lisinopril  40mg  Tablet] 90 tablet 0    Sig: Take 1 tablet by mouth daily.     Cardiovascular:  ACE Inhibitors Failed - 03/04/2024  2:34 PM      Failed - Cr in normal range and within 180 days    Creatinine  Date Value Ref Range Status  08/21/2013 0.66 0.60 - 1.30 mg/dL Final   Creatinine, Ser  Date Value Ref Range Status  07/20/2023 0.65 0.57 - 1.00 mg/dL Final         Failed - K in normal range and within 180 days    Potassium  Date Value Ref Range Status  07/20/2023 4.2 3.5 - 5.2 mmol/L Final  08/21/2013 3.7 3.5 - 5.1 mmol/L Final         Failed - Last BP in normal range    BP Readings from Last 1 Encounters:  11/26/23 (!) 175/73         Passed - Patient is not pregnant      Passed - Valid encounter within last 6 months    Recent Outpatient Visits           3 months ago Type II diabetes mellitus with complication (HCC)   [place] [facility] Sports Medicine at [facility], Leita DEL, MD   7 months ago Type II diabetes mellitus with complication [facility])   [place] [facility] Care & Sports Medicine at [facility], Leita DEL, MD   3 years ago Encounter for medication review   [place] [facility] [place] - A Dept Of [place]. [facility] Fleeta Tonia Garnette LITTIE, [name]       Future Appointments             In 2 weeks [name], MD [facility] Primary Care & Sports Medicine at [facility], [phone] [place]            Refused Prescriptions Disp Refills   doxepin  (SINEQUAN ) 10 MG capsule 90 capsule 0    Sig: Take 1 capsule by mouth at bedtime.     Psychiatry:   Antidepressants - Heterocyclics (TCAs) Passed - 03/04/2024  2:34 PM      Passed - Valid encounter within last 6 months    Recent Outpatient Visits           3 months ago Type II diabetes mellitus with complication [facility])   [place] [facility] Sports Medicine at [facility], Leita DEL, MD   7 months ago Type II diabetes mellitus with complication [facility])   [place] Primary Care & Sports Medicine at [facility], Leita DEL, MD   3 years ago Encounter for medication review   [place] [facility] - A Dept Of [place]. [facility] Fleeta Tonia Garnette LITTIE, [name]       Future Appointments             In 2 weeks Justus, Leita DEL, MD [facility] Primary [facility] & Sports Medicine at [facility], [phone] [place]           Psychiatry:  Antidepressants -  Heterocyclics (TCAs) - doxepin  Passed - 03/04/2024  2:34 PM      Passed - Valid encounter within last 12 months    Recent Outpatient Visits           3 months ago Type II diabetes mellitus with complication [facility])   [place] Primary Care & Sports Medicine at [facility], Leita DEL, MD   7 months ago Type II diabetes mellitus with complication [facility])   [place] Primary Care & Sports Medicine at [facility], Leita DEL, MD   3 years ago Encounter for medication review   [place] [facility] - A Dept Of . [facility] Fleeta Tonia Garnette LITTIE, [name]       Future Appointments             In 2 weeks Justus, Leita DEL, MD [facility] Primary [facility] Sports Medicine at [facility], [phone] [place]

## 2024-03-21 ENCOUNTER — Ambulatory Visit: Admitting: Internal Medicine

## 2024-04-01 NOTE — Progress Notes (Addendum)
 [name]                                          MRN: [id number]   04/01/2024   The [facility] Quality Team Specialist reviewed this patient medical record for the purposes of chart review for care gap closure. The following were reviewed: chart review for care gap closure-colorectal cancer screening.  05/23/2024- no record found to close col gap.    VBCI Quality Team

## 2024-04-18 ENCOUNTER — Other Ambulatory Visit: Payer: Self-pay | Admitting: Internal Medicine

## 2024-04-18 ENCOUNTER — Ambulatory Visit: Admitting: Internal Medicine

## 2024-04-18 ENCOUNTER — Ambulatory Visit (INDEPENDENT_AMBULATORY_CARE_PROVIDER_SITE_OTHER)

## 2024-04-18 ENCOUNTER — Encounter: Payer: Self-pay | Admitting: Internal Medicine

## 2024-04-18 VITALS — BP 122/80 | HR 70 | Ht 61.0 in | Wt 221.0 lb

## 2024-04-18 VITALS — BP 128/79 | HR 70 | Ht 61.0 in | Wt 221.2 lb

## 2024-04-18 DIAGNOSIS — E782 Mixed hyperlipidemia: Secondary | ICD-10-CM

## 2024-04-18 DIAGNOSIS — I10 Essential (primary) hypertension: Secondary | ICD-10-CM | POA: Diagnosis not present

## 2024-04-18 DIAGNOSIS — E118 Type 2 diabetes mellitus with unspecified complications: Secondary | ICD-10-CM

## 2024-04-18 DIAGNOSIS — M797 Fibromyalgia: Secondary | ICD-10-CM

## 2024-04-18 DIAGNOSIS — F5101 Primary insomnia: Secondary | ICD-10-CM

## 2024-04-18 DIAGNOSIS — Z0000 Encounter for general adult medical examination without abnormal findings: Secondary | ICD-10-CM | POA: Diagnosis not present

## 2024-04-18 DIAGNOSIS — Z23 Encounter for immunization: Secondary | ICD-10-CM

## 2024-04-18 DIAGNOSIS — Z7984 Long term (current) use of oral hypoglycemic drugs: Secondary | ICD-10-CM | POA: Diagnosis not present

## 2024-04-18 DIAGNOSIS — E039 Hypothyroidism, unspecified: Secondary | ICD-10-CM | POA: Diagnosis not present

## 2024-04-18 MED ORDER — ROSUVASTATIN CALCIUM 5 MG PO TABS: 5.0000 mg | ORAL_TABLET | ORAL | 0 refills | Status: AC

## 2024-04-18 MED ORDER — MELOXICAM 15 MG PO TABS: ORAL_TABLET | ORAL | 0 refills | Status: AC

## 2024-04-18 MED ORDER — ZOLPIDEM TARTRATE 5 MG PO TABS: 5.0000 mg | ORAL_TABLET | Freq: Every evening | ORAL | 0 refills | Status: DC | PRN

## 2024-04-18 NOTE — Assessment & Plan Note (Signed)
 No longer responding to Trazodone . Will try Ambien since she no longer works or does much driving.

## 2024-04-18 NOTE — Progress Notes (Signed)
 Subjective:   [name] is a 63 y.o. female who presents for a Medicare Annual Wellness Visit.  Allergies (verified) Sulfa antibiotics, Amlodipine, Furosemide, Lyrica [pregabalin], and Hydrochlorothiazide   History: Past Medical History:  Diagnosis Date   Allergic rhinitis    Anemia    Arthritis    Asthma    well controlled   Basal cell carcinoma 07/1997   Treated by Dr Jakie   BRCA2 gene mutation positive 12/2018   MyRisk BRCA 2 positive with special interpretation--mutation has less penetrance than usual BRCA 2 mutation   Bursitis    Chest pain 07/19/2012   Overview:   Myoview prelim neg 07/2012, ECHO normal LV fcn mild MVP(prelim)     Chronic peptic ulcer    Complication of anesthesia    thrashing and screaming after cholecystectomy   Dermatitis, eczematoid 09/30/2011   Duodenal ulcer    Family history of breast cancer    Fibromyalgia    Gastritis    GERD (gastroesophageal reflux disease)    Headache    h/o migraines   Heart murmur    asymptomatic   Hiatal hernia    History of kidney stones    h/o   Hypertension    Hypothyroidism    IBS (irritable bowel syndrome)    OSA (obstructive sleep apnea)    has CPAP-not using currently due to needing new mask as of 05-13-19   Pneumonia 2009   Psoriasis    Rheumatic fever    Type II diabetes mellitus (HCC)    Past Surgical History:  Procedure Laterality Date   APPENDECTOMY     BREAST BIOPSY Left 2007   benign, dr Dessa   CATARACT EXTRACTION W/PHACO Right 11/26/2023   Procedure: PHACOEMULSIFICATION, CATARACT, WITH IOL INSERTION 19.41, 01:43.6;  Surgeon: Enola Feliciano Hugger, MD;  Location: [facility];  Service: Ophthalmology;  Laterality: Right;   CHOLECYSTECTOMY     CYSTOSCOPY  05/19/2019   Procedure: CYSTOSCOPY;  Surgeon: Victor Claudell SAUNDERS, MD;  Location: [facility];  Service: Gynecology;;   HYSTEROSCOPY WITH D & C N/A 12/14/2018   Procedure: DILATATION AND CURETTAGE /HYSTEROSCOPY WITH  MYOSURE, VULVAR BIOPSY;  Surgeon: Victor Claudell SAUNDERS, MD;  Location: [facility];  Service: Gynecology;  Laterality: N/A;   SKIN BIOPSY Left    Back on L leg    SKIN CANCER EXCISION     BCCA of face   TONSILLECTOMY AND ADENOIDECTOMY     TOTAL LAPAROSCOPIC HYSTERECTOMY WITH SALPINGECTOMY N/A 05/19/2019   Procedure: TOTAL LAPAROSCOPIC HYSTERECTOMY WITH SALPINGECTOMY/ TLH/BSO;  Surgeon: Victor Claudell SAUNDERS, MD;  Location: [facility];  Service: Gynecology;  Laterality: N/A;   Family History  Problem Relation Age of Onset   Breast cancer Mother 65   CAD Father    Heart attack Father    Bladder Cancer Father 53   Breast cancer Paternal Grandmother 30   Leukemia Paternal Grandmother 65   Diabetes Other        multiple family members   Breast cancer Maternal Aunt 47   Social History   Occupational History   Occupation: disabled  Tobacco Use   Smoking status: Never   Smokeless tobacco: Never  Vaping Use   Vaping status: Never Used  Substance and Sexual Activity   Alcohol use: Yes    Alcohol/week: 2.0 standard drinks of alcohol    Types: 2 Standard drinks or equivalent per week    Comment: rarely   Drug use: No   Sexual activity: Yes  Birth control/protection: Post-menopausal   Tobacco Counseling Counseling given: Not Answered  SDOH Screenings   Food Insecurity: No Food Insecurity (04/18/2024)  Housing: Unknown (04/18/2024)  Transportation Needs: No Transportation Needs (04/18/2024)  Utilities: Not At Risk (04/18/2024)  Alcohol Screen: Low Risk  (03/31/2023)  Depression (PHQ2-9): Low Risk  (04/18/2024)  Financial Resource Strain: Low Risk  (03/31/2023)  Physical Activity: Inactive (04/18/2024)  Social Connections: Moderately Isolated (04/18/2024)  Stress: No Stress Concern Present (04/18/2024)  Tobacco Use: Low Risk  (04/18/2024)  Health Literacy: Adequate Health Literacy (04/18/2024)   Depression Screen    04/18/2024    3:03 PM 11/18/2023   10:58 AM 07/20/2023     3:03 PM 03/31/2023   10:16 AM 11/07/2022    2:43 PM 03/28/2022    9:51 AM 09/09/2021    1:27 PM  PHQ 2/9 Scores  PHQ - 2 Score 1 2 2  0 0 1 0  PHQ- 9 Score 3 9  9   0  8  9  6       Data saved with a previous flowsheet row definition      Goals Addressed             This Visit's Progress    Weight (lb) < 200 lb (90.7 kg)         Visit info / Clinical Intake: Medicare Wellness Visit Type:: Initial Annual Wellness Visit Persons participating in visit:: patient Medicare Wellness Visit Mode:: In-person (required for WTM) Information given by:: patient Interpreter Needed?: No Pre-visit prep was completed: yes AWV questionnaire completed by patient prior to visit?: no Living arrangements:: lives with spouse/significant other Patient's Overall Health Status Rating: (!) fair Typical amount of pain: (!) a lot Does pain affect daily life?: (!) yes Are you currently prescribed opioids?: no  Dietary Habits and Nutritional Risks How many meals a day?: 2 Eats fruit and vegetables daily?: yes Most meals are obtained by: preparing own meals; eating out Diabetic:: (!) yes Any non-healing wounds?: no How often do you check your BS?: as needed Would you like to be referred to a Nutritionist or for Diabetic Management? : no  Functional Status Activities of Daily Living (to include ambulation/medication): Independent Ambulation: Independent Medication Administration: Independent Home Management: Independent Manage your own finances?: yes Primary transportation is: driving; family/friends Concerns about vision?: no *vision screening is required for WTM* Concerns about hearing?: no  Fall Screening Falls in the past year?: 0 Number of falls in past year: 0 Was there an injury with Fall?: 0 Fall Risk Category Calculator: 0 Patient Fall Risk Level: Low Fall Risk  Fall Risk Patient at Risk for Falls Due to: History of fall(s) Fall risk Follow up: Falls evaluation completed  Home and  Transportation Safety: All rugs have non-skid backing?: yes All stairs or steps have railings?: N/A, no stairs Grab bars in the bathtub or shower?: yes Have non-skid surface in bathtub or shower?: yes Good home lighting?: yes Regular seat belt use?: yes Hospital stays in the last year:: no  Cognitive Assessment Difficulty concentrating, remembering, or making decisions? : no Will 6CIT or Mini Cog be Completed: yes What year is it?: 0 points What month is it?: 0 points Give patient an address phrase to remember (5 components): dog cat tree ball car About what time is it?: 0 points Count backwards from 20 to 1: 0 points Say the months of the year in reverse: 0 points Repeat the address phrase from earlier: 4 points 6 CIT Score: 4 points  Advance Directives (For Healthcare) Does Patient Have a Medical Advance Directive?: Yes Type of Advance Directive: Healthcare Power of [place]; Living will Copy of Healthcare Power of Attorney in Chart?: No - copy requested Copy of Living Will in Chart?: No - copy requested  Reviewed/Updated  Reviewed/Updated: Reviewed All (Medical, Surgical, Family, Medications, Allergies, Care Teams, Patient Goals)        Objective:    There were no vitals filed for this visit. There is no height or weight on file to calculate BMI.  Current Medications (verified) Outpatient Encounter Medications as of 04/18/2024  Medication Sig   albuterol  (VENTOLIN  HFA) 108 (90 Base) MCG/ACT inhaler Inhale 2 puffs into the lungs 4 (four) times daily as needed.   aspirin  81 MG EC tablet Take 81 mg by mouth at bedtime.    blood glucose meter kit and supplies KIT Dispense based on patient and insurance preference. Use up to four times daily as directed. (FOR ICD-9 250.00, 250.01).   Blood Glucose Monitoring Suppl DEVI 1 each by Does not apply route in the morning, at noon, and at bedtime. May substitute to any manufacturer covered by patient's insurance.   Calcitriol  (VECTICAL) 3 MCG/GM cream Apply 3 mcg topically at bedtime.   cetirizine (ZYRTEC) 10 MG tablet Take 10 mg by mouth every morning.    cyclobenzaprine  (FLEXERIL ) 10 MG tablet Take 1 tablet by mouth three times daily.   diphenhydrAMINE  (BENADRYL ) 25 MG tablet Take 25-50 mg by mouth every 6 (six) hours as needed (allergies/asthma).   doxazosin  (CARDURA ) 4 MG tablet Take 1 tablet (4 mg total) by mouth daily.   doxepin  (SINEQUAN ) 100 MG capsule Take 1 capsule (100 mg total) by mouth at bedtime. TAKE 1 CAPSULE(100 MG) BY MOUTH AT BEDTIME   esomeprazole  (NEXIUM ) 40 MG capsule Take 1 capsule by mouth daily before supper at 5 PM.   FIBER, GUAR GUM, PO Take by mouth.   Fluocinolone  Acetonide 0.01 % OIL Apply 1-2 drops to ear canals 1-2 times daily as needed for psoriasis.   lactobacillus acidophilus (BACID) TABS tablet Take 2 tablets by mouth daily.   Lancets (FREESTYLE) lancets 1 each 4 (four) times daily.   levothyroxine  (SYNTHROID ) 200 MCG tablet Take 1 tablet (200 mcg total) by mouth daily.   lisinopril  (ZESTRIL ) 40 MG tablet Take 1 tablet by mouth daily.   loperamide (IMODIUM A-D) 2 MG tablet Take 2-4 mg by mouth 4 (four) times daily as needed for diarrhea or loose stools.    meloxicam  (MOBIC ) 15 MG tablet TAKE 1 TABLET(15 MG) BY MOUTH DAILY   metFORMIN  (GLUCOPHAGE -XR) 500 MG 24 hr tablet TAKE 1 TABLET BY MOUTH TWICE DAILY WITH A MEAL   montelukast  (SINGULAIR ) 10 MG tablet Take 1 tablet by mouth every night at bedtime.   Multiple Vitamins-Minerals (MULTIVITAMIN WOMEN 50+ PO) Take by mouth.   Nebivolol  HCl 20 MG TABS Take 1 tablet (20 mg total) by mouth daily.   OVER THE COUNTER MEDICATION Woman's multvitamin-Take 1 gummie daily.   tamoxifen  (NOLVADEX ) 20 MG tablet Take 1 tablet by mouth daily.   traMADol  (ULTRAM ) 50 MG tablet Take 1 tablet (50 mg total) by mouth every 12 (twelve) hours as needed.   traZODone  (DESYREL ) 50 MG tablet Take 1-2 tablets (50-100 mg total) by mouth at bedtime.   vitamin  B-12 (CYANOCOBALAMIN) 1000 MCG tablet Take 1500 mcg-2 gummie daily.   No facility-administered encounter medications on file as of 04/18/2024.   Hearing/Vision screen No results found. Immunizations and Health  Maintenance Health Maintenance  Topic Date Due   HIV Screening  Never done   Pneumococcal Vaccine: 50+ Years (3 of 3 - PCV20 or PCV21) 01/16/2021   Colonoscopy  06/10/2022   Influenza Vaccine  01/08/2024   COVID-19 Vaccine (5 - 2025-26 season) 02/08/2024   HEMOGLOBIN A1C  05/19/2024   Diabetic kidney evaluation - eGFR measurement  07/19/2024   FOOT EXAM  07/19/2024   OPHTHALMOLOGY EXAM  07/19/2024   Diabetic kidney evaluation - Urine ACR  11/17/2024   Mammogram  12/29/2024   Medicare Annual Wellness (AWV)  04/18/2025   DTaP/Tdap/Td (2 - Td or Tdap) 12/30/2031   Zoster Vaccines- Shingrix   Completed   Hepatitis C Screening  Addressed   Hepatitis B Vaccines 19-59 Average Risk  Aged Out   HPV VACCINES  Aged Out   Meningococcal B Vaccine  Aged Out        Assessment/Plan:  This is a routine wellness examination for [place].  Patient Care Team: [name], MD as PCP - General (Internal Medicine) Hester Alm BROCKS, MD (Dermatology) Victor Claudell SAUNDERS, MD as Consulting Physician (Obstetrics and Gynecology) [facility] (Ophthalmology)  I have personally reviewed and noted the following in the patient's chart:   Medical and social history Use of alcohol, tobacco or illicit drugs  Current medications and supplements including opioid prescriptions. Functional ability and status Nutritional status Physical activity Advanced directives List of other physicians Hospitalizations, surgeries, and ER visits in previous 12 months Vitals Screenings to include cognitive, depression, and falls Referrals and appointments  No orders of the defined types were placed in this encounter.  In addition, I have reviewed and discussed with patient certain preventive protocols,  quality metrics, and best practice recommendations. A written personalized care plan for preventive services as well as general preventive health recommendations were provided to patient.   Pearly Bartosik N Keaja Reaume, CMA   04/18/2024   Return in 1 year (on 04/18/2025).  After Visit Summary: (In Person-Printed) AVS printed and given to the patient  Nurse Notes: None.

## 2024-04-18 NOTE — Assessment & Plan Note (Signed)
 Well controlled blood pressure today. Current regimen is Bystolic , lisinopril  and Cardura . No medication side effects noted.

## 2024-04-18 NOTE — Progress Notes (Signed)
 Date:  04/18/2024   Name:  [name]   DOB:  11/05/1960   MRN:  [id number]   Chief Complaint: Hypertension, Gastroesophageal Reflux, Diabetes, Hypothyroidism, and Insomnia (Trazodone  is not working well anymore. Would like to discuss alternative medicine.)  Hypertension Pertinent negatives include no chest pain, headaches, palpitations or shortness of breath. Identifiable causes of hypertension include a thyroid  problem.  Gastroesophageal Reflux She reports no abdominal pain, no chest pain, no coughing or no wheezing. Associated symptoms include fatigue.  Diabetes Pertinent negatives for hypoglycemia include no dizziness, headaches or nervousness/anxiousness. Associated symptoms include fatigue. Pertinent negatives for diabetes include no chest pain and no weakness.  Thyroid  Problem Symptoms include fatigue. Patient reports no anxiety, constipation, diarrhea or palpitations.  Migraine  This is a recurrent (seen in UC 2 mo ago for headache and vertigo) problem. The problem has been resolved. Associated symptoms include insomnia. Pertinent negatives include no abdominal pain, coughing, dizziness or weakness. Her past medical history is significant for hypertension.  Insomnia Primary symptoms: sleep disturbance, difficulty falling asleep, frequent awakening.   The problem occurs nightly. The problem has been gradually worsening since onset. Relieved by: has been on Trazodone  for a while.    Review of Systems  Constitutional:  Positive for fatigue. Negative for chills and unexpected weight change.  HENT:  Negative for trouble swallowing.   Eyes:  Negative for visual disturbance.  Respiratory:  Negative for cough, chest tightness, shortness of breath and wheezing.   Cardiovascular:  Positive for leg swelling. Negative for chest pain and palpitations.  Gastrointestinal:  Negative for abdominal pain, constipation and diarrhea.  Genitourinary:  Positive for frequency and urgency.   Musculoskeletal:  Positive for arthralgias and myalgias.  Neurological:  Negative for dizziness, weakness, light-headedness and headaches.  Psychiatric/Behavioral:  Positive for sleep disturbance. Negative for dysphoric mood. The patient has insomnia. The patient is not nervous/anxious.      Lab Results  Component Value Date   NA 143 07/20/2023   K 4.2 07/20/2023   CO2 25 07/20/2023   GLUCOSE 101 (H) 07/20/2023   BUN 17 07/20/2023   CREATININE 0.65 07/20/2023   CALCIUM 9.0 07/20/2023   EGFR 99 07/20/2023   GFRNONAA 104 11/21/2019   Lab Results  Component Value Date   CHOL 172 07/20/2023   HDL 47 07/20/2023   LDLCALC 80 07/20/2023   TRIG 273 (H) 07/20/2023   CHOLHDL 3.7 07/20/2023   Lab Results  Component Value Date   TSH 3.990 07/20/2023   Lab Results  Component Value Date   HGBA1C 5.6 11/18/2023   Lab Results  Component Value Date   WBC 10.3 11/07/2022   HGB 13.2 11/07/2022   HCT 39.5 11/07/2022   MCV 94 11/07/2022   PLT 304 11/07/2022   Lab Results  Component Value Date   ALT 15 07/20/2023   AST 20 07/20/2023   ALKPHOS 99 07/20/2023   BILITOT 0.2 07/20/2023   Lab Results  Component Value Date   VD25OH 17.4 (L) 11/07/2022     Patient Active Problem List   Diagnosis Date Noted   Vitamin D  deficiency 11/18/2023   Flexor tenosynovitis of finger 01/17/2022   S/P hysterectomy with oophorectomy 05/19/2019   BRCA2 gene mutation positive 03/03/2019   Type II diabetes mellitus with complication (HCC) 09/16/2016   Insomnia 09/16/2016   Arthritis of carpometacarpal (CMC) joint of thumb 05/29/2016   Venous insufficiency of both lower extremities 01/17/2016   Acquired hypothyroidism 11/10/2014   Essential (primary) hypertension  11/10/2014   Fibromyalgia syndrome 11/10/2014   Mixed hyperlipidemia 11/10/2014   Irritable bowel syndrome with diarrhea 11/10/2014   Asthma, mild intermittent 11/10/2014   BMI 40.0-44.9, adult (HCC) 11/10/2014   Chronic peptic ulcer  11/10/2014   Psoriasis 11/10/2014   Obstructive apnea 11/10/2014   Environmental and seasonal allergies 09/08/2011    Allergies  Allergen Reactions   Sulfa Antibiotics Other (See Comments)    seizure   Amlodipine Swelling    Leg swelling   Furosemide Swelling   Lyrica [Pregabalin] Other (See Comments)    Dizziness and blurred vision   Hydrochlorothiazide Itching and Rash    Past Surgical History:  Procedure Laterality Date   APPENDECTOMY     BREAST BIOPSY Left 2007   benign, dr Dessa   CATARACT EXTRACTION W/PHACO Right 11/26/2023   Procedure: PHACOEMULSIFICATION, CATARACT, WITH IOL INSERTION 19.41, 01:43.6;  Surgeon: Enola Feliciano Hugger, MD;  [facility];  Service: Ophthalmology;  Laterality: Right;   CHOLECYSTECTOMY     CYSTOSCOPY  05/19/2019   Procedure: CYSTOSCOPY;  Surgeon: Victor Claudell SAUNDERS, MD;  Location: [facility];  Service: Gynecology;;   HYSTEROSCOPY WITH D & C N/A 12/14/2018   Procedure: DILATATION AND CURETTAGE /HYSTEROSCOPY WITH MYOSURE, VULVAR BIOPSY;  Surgeon: Victor Claudell SAUNDERS, MD;  Location: [facility];  Service: Gynecology;  Laterality: N/A;   SKIN BIOPSY Left    Back on L leg    SKIN CANCER EXCISION     BCCA of face   TONSILLECTOMY AND ADENOIDECTOMY     TOTAL LAPAROSCOPIC HYSTERECTOMY WITH SALPINGECTOMY N/A 05/19/2019   Procedure: TOTAL LAPAROSCOPIC HYSTERECTOMY WITH SALPINGECTOMY/ TLH/BSO;  Surgeon: Victor Claudell SAUNDERS, MD;  Location: [facility];  Service: Gynecology;  Laterality: N/A;    Social History   Tobacco Use   Smoking status: Never   Smokeless tobacco: Never  Vaping Use   Vaping status: Never Used  Substance Use Topics   Alcohol use: Yes    Alcohol/week: 2.0 standard drinks of alcohol    Types: 2 Standard drinks or equivalent per week    Comment: rarely   Drug use: No     Medication list has been reviewed and updated.  Current Meds  Medication Sig   albuterol  (VENTOLIN  HFA) 108 (90 Base) MCG/ACT inhaler  Inhale 2 puffs into the lungs 4 (four) times daily as needed.   aspirin  81 MG EC tablet Take 81 mg by mouth at bedtime.    blood glucose meter kit and supplies KIT Dispense based on patient and insurance preference. Use up to four times daily as directed. (FOR ICD-9 250.00, 250.01).   Blood Glucose Monitoring Suppl DEVI 1 each by Does not apply route in the morning, at noon, and at bedtime. May substitute to any manufacturer covered by patient's insurance.   Calcitriol (VECTICAL) 3 MCG/GM cream Apply 3 mcg topically at bedtime.   cetirizine (ZYRTEC) 10 MG tablet Take 10 mg by mouth every morning.    cyclobenzaprine  (FLEXERIL ) 10 MG tablet Take 1 tablet by mouth three times daily.   diphenhydrAMINE  (BENADRYL ) 25 MG tablet Take 25-50 mg by mouth every 6 (six) hours as needed (allergies/asthma).   doxazosin  (CARDURA ) 4 MG tablet Take 1 tablet (4 mg total) by mouth daily.   doxepin  (SINEQUAN ) 100 MG capsule Take 1 capsule (100 mg total) by mouth at bedtime. TAKE 1 CAPSULE(100 MG) BY MOUTH AT BEDTIME   esomeprazole  (NEXIUM ) 40 MG capsule Take 1 capsule by mouth daily before supper at 5 PM.   FIBER,  GUAR GUM, PO Take by mouth.   Fluocinolone  Acetonide 0.01 % OIL Apply 1-2 drops to ear canals 1-2 times daily as needed for psoriasis.   lactobacillus acidophilus (BACID) TABS tablet Take 2 tablets by mouth daily.   Lancets (FREESTYLE) lancets 1 each 4 (four) times daily.   levothyroxine  (SYNTHROID ) 200 MCG tablet Take 1 tablet (200 mcg total) by mouth daily.   lisinopril  (ZESTRIL ) 40 MG tablet Take 1 tablet by mouth daily.   loperamide (IMODIUM A-D) 2 MG tablet Take 2-4 mg by mouth 4 (four) times daily as needed for diarrhea or loose stools.    metFORMIN  (GLUCOPHAGE -XR) 500 MG 24 hr tablet TAKE 1 TABLET BY MOUTH TWICE DAILY WITH A MEAL   montelukast  (SINGULAIR ) 10 MG tablet Take 1 tablet by mouth every night at bedtime.   Multiple Vitamins-Minerals (MULTIVITAMIN WOMEN 50+ PO) Take by mouth.   Nebivolol   HCl 20 MG TABS Take 1 tablet (20 mg total) by mouth daily.   OVER THE COUNTER MEDICATION Woman's multvitamin-Take 1 gummie daily.   rosuvastatin (CRESTOR) 5 MG tablet Take 1 tablet (5 mg total) by mouth 3 (three) times a week.   tamoxifen  (NOLVADEX ) 20 MG tablet Take 1 tablet by mouth daily.   traMADol  (ULTRAM ) 50 MG tablet Take 1 tablet (50 mg total) by mouth every 12 (twelve) hours as needed.   traZODone  (DESYREL ) 50 MG tablet Take 1-2 tablets (50-100 mg total) by mouth at bedtime.   vitamin B-12 (CYANOCOBALAMIN) 1000 MCG tablet Take 1500 mcg-2 gummie daily.   zolpidem (AMBIEN) 5 MG tablet Take 1 tablet (5 mg total) by mouth at bedtime as needed for sleep.   [DISCONTINUED] meloxicam  (MOBIC ) 15 MG tablet TAKE 1 TABLET(15 MG) BY MOUTH DAILY       04/18/2024    3:03 PM 11/18/2023   10:59 AM 07/20/2023    3:03 PM 11/07/2022    2:44 PM  GAD 7 : Generalized Anxiety Score  Nervous, Anxious, on Edge 1 0 0 0  Control/stop worrying 1 0 0 0  Worry too much - different things 1 0 0 0  Trouble relaxing 1 2 0 0  Restless 1 0 0 0  Easily annoyed or irritable 0 2 2 0  Afraid - awful might happen 0 0 0 0  Total GAD 7 Score 5 4 2  0  Anxiety Difficulty Somewhat difficult Not difficult at all Not difficult at all Not difficult at all       04/18/2024    3:03 PM 11/18/2023   10:58 AM 07/20/2023    3:03 PM  Depression screen PHQ 2/9  Decreased Interest 1 2 2   Down, Depressed, Hopeless 0 0 0  PHQ - 2 Score 1 2 2   Altered sleeping 1 3 3   Tired, decreased energy 1 3 2   Change in appetite 0 0 1  Feeling bad or failure about yourself  0 0 0  Trouble concentrating 0 1 1  Moving slowly or fidgety/restless 0 0 0  Suicidal thoughts 0 0 0  PHQ-9 Score 3 9  9    Difficult doing work/chores Not difficult at all Somewhat difficult Somewhat difficult     Data saved with a previous flowsheet row definition    BP Readings from Last 3 Encounters:  04/18/24 122/80  04/18/24 128/79  11/26/23 (!) 175/73     Physical Exam Vitals and nursing note reviewed.  Constitutional:      General: She is not in acute distress.    Appearance: She  is well-developed. She is obese.  HENT:     Head: Normocephalic and atraumatic.  Cardiovascular:     Rate and Rhythm: Normal rate and regular rhythm.     Heart sounds: No murmur heard. Pulmonary:     Effort: Pulmonary effort is normal. No respiratory distress.     Breath sounds: No wheezing or rhonchi.  Musculoskeletal:     Cervical back: Normal range of motion.     Right lower leg: Edema present.     Left lower leg: Edema present.     Comments: Trace LE edema - TED hose in place  Lymphadenopathy:     Cervical: No cervical adenopathy.  Skin:    General: Skin is warm and dry.     Capillary Refill: Capillary refill takes less than 2 seconds.     Findings: No rash.  Neurological:     General: No focal deficit present.     Mental Status: She is alert and oriented to person, place, and time.     Gait: Gait normal.  Psychiatric:        Mood and Affect: Mood normal.        Behavior: Behavior normal.     Wt Readings from Last 3 Encounters:  04/18/24 221 lb (100.2 kg)  04/18/24 221 lb 3.2 oz (100.3 kg)  11/26/23 215 lb (97.5 kg)    BP 128/79   Pulse 70   Ht 5' 1 (1.549 m)   Wt 221 lb 3.2 oz (100.3 kg)   SpO2 94%   BMI 41.80 kg/m   Assessment and Plan:  Problem List Items Addressed This Visit       Unprioritized   Type II diabetes mellitus with complication (HCC) (Chronic)   Currently medications are MTF.  No hypoglycemic episodes noted. Home blood sugars in the 130 range. Last visit medical regimen changes were none. Lab Results  Component Value Date   HGBA1C 5.6 11/18/2023          Relevant Medications   rosuvastatin (CRESTOR) 5 MG tablet   Other Relevant Orders   Comprehensive metabolic panel with GFR   Hemoglobin A1c   Mixed hyperlipidemia (Chronic)   Not currently on medication.  10 yr risk is elevated.  Statin  recommended.  She is hesitant over myalgia concerns but is willing to try Crestor 5 mg on MWF.  The 10-year ASCVD risk score (Arnett DK, et al., 2019) is: 18.8%   Values used to calculate the score:     Age: 63 years     Clincally relevant sex: Female     Is Non-Hispanic African American: No     Diabetic: Yes     Tobacco smoker: No     Systolic Blood Pressure: 160 mmHg     Is BP treated: Yes     HDL Cholesterol: 47 mg/dL     Total Cholesterol: 172 mg/dL       Relevant Medications   rosuvastatin (CRESTOR) 5 MG tablet   Insomnia (Chronic)   No longer responding to Trazodone . Will try Ambien since she no longer works or does much driving.      Relevant Medications   zolpidem (AMBIEN) 5 MG tablet   Fibromyalgia syndrome   Essentially unchanged - more muscle spasm and neck pain relieved by massage therapy. She continues on Mobic  and cyclobenzaprine .      Relevant Medications   meloxicam  (MOBIC ) 15 MG tablet   Essential (primary) hypertension - Primary (Chronic)   Well controlled blood pressure  today. Current regimen is Bystolic , lisinopril  and Cardura . No medication side effects noted.        Relevant Medications   rosuvastatin (CRESTOR) 5 MG tablet   Acquired hypothyroidism (Chronic)   Supplemented. Lab Results  Component Value Date   TSH 3.990 07/20/2023         Other Visit Diagnoses       Long term current use of oral hypoglycemic drug         Encounter for immunization       Relevant Orders   Flu vaccine trivalent PF, 6mos and older(Flulaval,Afluria,Fluarix,Fluzone) (Completed)      MAW done by CMA today. Return in about 4 months (around 08/16/2024) for DM, HTN - Dr Lemon.    Leita HILARIO Adie, MD [facility] [place]

## 2024-04-18 NOTE — Assessment & Plan Note (Addendum)
 Not currently on medication.  10 yr risk is elevated.  Statin recommended.  She is hesitant over myalgia concerns but is willing to try Crestor 5 mg on MWF.  The 10-year ASCVD risk score (Arnett DK, et al., 2019) is: 18.8%   Values used to calculate the score:     Age: 63 years     Clincally relevant sex: Female     Is Non-Hispanic African American: No     Diabetic: Yes     Tobacco smoker: No     Systolic Blood Pressure: 160 mmHg     Is BP treated: Yes     HDL Cholesterol: 47 mg/dL     Total Cholesterol: 172 mg/dL

## 2024-04-18 NOTE — Patient Instructions (Signed)
Take vitamin D daily. 

## 2024-04-18 NOTE — Assessment & Plan Note (Signed)
 Supplemented. Lab Results  Component Value Date   TSH 3.990 07/20/2023

## 2024-04-18 NOTE — Assessment & Plan Note (Signed)
 Currently medications are MTF.  No hypoglycemic episodes noted. Home blood sugars in the 130 range. Last visit medical regimen changes were none. Lab Results  Component Value Date   HGBA1C 5.6 11/18/2023

## 2024-04-18 NOTE — Patient Instructions (Signed)
 [name],  Thank you for taking the time for your Medicare Wellness Visit. I appreciate your continued commitment to your health goals. Please review the care plan we discussed, and feel free to reach out if I can assist you further.  Please note that Annual Wellness Visits do not include a physical exam. Some assessments may be limited, especially if the visit was conducted virtually. If needed, we may recommend an in-person follow-up with your provider.  Ongoing Care Seeing your primary care provider every 3 to 6 months helps us  monitor your health and provide consistent, personalized care.   Referrals If a referral was made during today's visit and you haven't received any updates within two weeks, please contact the referred provider directly to check on the status.  Recommended Screenings:  Health Maintenance  Topic Date Due   HIV Screening  Never done   Pneumococcal Vaccine for age over 34 (3 of 3 - PCV20 or PCV21) 01/16/2021   Colon Cancer Screening  06/10/2022   Flu Shot  01/08/2024   COVID-19 Vaccine (5 - 2025-26 season) 02/08/2024   Medicare Annual Wellness Visit  03/30/2024   Hemoglobin A1C  05/19/2024   Yearly kidney function blood test for diabetes  07/19/2024   Complete foot exam   07/19/2024   Eye exam for diabetics  07/19/2024   Yearly kidney health urinalysis for diabetes  11/17/2024   Breast Cancer Screening  12/29/2024   DTaP/Tdap/Td vaccine (2 - Td or Tdap) 12/30/2031   Zoster (Shingles) Vaccine  Completed   Hepatitis C Screening  Addressed   Hepatitis B Vaccine  Aged Out   HPV Vaccine  Aged Out   Meningitis B Vaccine  Aged Out       04/18/2024    3:29 PM  Advanced Directives  Does Patient Have a Medical Advance Directive? Yes  Type of Estate Agent of [place];Living will  Copy of Healthcare Power of Attorney in Chart? No - copy requested    Vision: Annual vision screenings are recommended for early detection of glaucoma,  cataracts, and diabetic retinopathy. These exams can also reveal signs of chronic conditions such as diabetes and high blood pressure.  Dental: Annual dental screenings help detect early signs of oral cancer, gum disease, and other conditions linked to overall health, including heart disease and diabetes.  Please see the attached documents for additional preventive care recommendations.

## 2024-04-18 NOTE — Assessment & Plan Note (Addendum)
 Essentially unchanged - more muscle spasm and neck pain relieved by massage therapy. She continues on Mobic  and cyclobenzaprine .

## 2024-04-19 ENCOUNTER — Ambulatory Visit: Payer: Self-pay | Admitting: Internal Medicine

## 2024-04-19 LAB — COMPREHENSIVE METABOLIC PANEL WITH GFR
ALT: 15 IU/L (ref 0–32)
AST: 24 IU/L (ref 0–40)
Albumin: 3.9 g/dL (ref 3.9–4.9)
Alkaline Phosphatase: 92 IU/L (ref 49–135)
BUN/Creatinine Ratio: 14 (ref 12–28)
BUN: 10 mg/dL (ref 8–27)
Bilirubin Total: 0.2 mg/dL (ref 0.0–1.2)
CO2: 24 mmol/L (ref 20–29)
Calcium: 9.1 mg/dL (ref 8.7–10.3)
Chloride: 99 mmol/L (ref 96–106)
Creatinine, Ser: 0.72 mg/dL (ref 0.57–1.00)
Globulin, Total: 2.4 g/dL (ref 1.5–4.5)
Glucose: 122 mg/dL — ABNORMAL HIGH (ref 70–99)
Potassium: 4.4 mmol/L (ref 3.5–5.2)
Sodium: 140 mmol/L (ref 134–144)
Total Protein: 6.3 g/dL (ref 6.0–8.5)
eGFR: 93 mL/min/1.73 (ref >59)

## 2024-04-19 LAB — HEMOGLOBIN A1C
Est. average glucose Bld gHb Est-mCnc: 128 mg/dL
Hgb A1c MFr Bld: 6.1 % — ABNORMAL HIGH (ref 4.8–5.6)

## 2024-04-20 ENCOUNTER — Other Ambulatory Visit: Payer: Self-pay | Admitting: Internal Medicine

## 2024-04-20 DIAGNOSIS — M797 Fibromyalgia: Secondary | ICD-10-CM

## 2024-04-20 DIAGNOSIS — K277 Chronic peptic ulcer, site unspecified, without hemorrhage or perforation: Secondary | ICD-10-CM

## 2024-04-20 DIAGNOSIS — Z1501 Genetic susceptibility to malignant neoplasm of breast: Secondary | ICD-10-CM

## 2024-04-20 NOTE — Telephone Encounter (Signed)
 Duplicate request,refilled 04/18/24.  Requested Prescriptions  Pending Prescriptions Disp Refills   rosuvastatin (CRESTOR) 5 MG tablet [Pharmacy Med Name: ROSUVASTATIN 5MG  TABLETS] 38 tablet     Sig: TAKE 1 TABLET(5 MG) BY MOUTH 3 TIMES A WEEK     Cardiovascular:  Antilipid - Statins 2 Failed - 04/20/2024  4:02 PM      Failed - Lipid Panel in normal range within the last 12 months    Cholesterol, Total  Date Value Ref Range Status  07/20/2023 172 100 - 199 mg/dL Final   Cholesterol  Date Value Ref Range Status  04/14/2012 187 0 - 200 mg/dL Final   Ldl Cholesterol, Calc  Date Value Ref Range Status  04/14/2012 102 (H) 0 - 100 mg/dL Final   LDL Chol Calc (NIH)  Date Value Ref Range Status  07/20/2023 80 0 - 99 mg/dL Final   HDL Cholesterol  Date Value Ref Range Status  04/14/2012 51 40 - 60 mg/dL Final   HDL  Date Value Ref Range Status  07/20/2023 47 >39 mg/dL Final   Triglycerides  Date Value Ref Range Status  07/20/2023 273 (H) 0 - 149 mg/dL Final  88/93/7986 828 0 - 200 mg/dL Final         Passed - Cr in normal range and within 360 days    Creatinine  Date Value Ref Range Status  08/21/2013 0.66 0.60 - 1.30 mg/dL Final   Creatinine, Ser  Date Value Ref Range Status  04/18/2024 0.72 0.57 - 1.00 mg/dL Final         Passed - Patient is not pregnant      Passed - Valid encounter within last 12 months    Recent Outpatient Visits           2 days ago Essential (primary) hypertension   [place] [facility] at [facility], Leita DEL, MD   5 months ago Type II diabetes mellitus with complication [facility])   [place] [facility] at [facility], Leita DEL, MD   9 months ago Type II diabetes mellitus with complication [facility])   [place] [facility] at [facility], Leita DEL, MD   3 years ago Encounter for medication review   [facility]  [place] Tonia Garnette LITTIE, [name]

## 2024-04-22 NOTE — Telephone Encounter (Signed)
 Requested Prescriptions  Pending Prescriptions Disp Refills   tamoxifen  (NOLVADEX ) 20 MG tablet 90 tablet 0    Sig: Take 1 tablet by mouth daily.     Off-Protocol Failed - 04/22/2024 12:14 PM      Failed - Medication not assigned to a protocol, review manually.      Passed - Valid encounter within last 12 months    Recent Outpatient Visits           4 days ago Essential (primary) hypertension   [place] [facility] at [facility], Leita DEL, MD   5 months ago Type II diabetes mellitus with complication [facility])   [place] [facility] at [facility], Leita DEL, MD   9 months ago Type II diabetes mellitus with complication [facility])   [place] [facility] at [facility], Leita DEL, MD   3 years ago Encounter for medication review   [facility] [place]. [facility] Fleeta Morris, Stephen L, [name]               esomeprazole  (NEXIUM ) 40 MG capsule 90 capsule 1    Sig: Take 1 capsule by mouth daily before supper at 5 PM.     Gastroenterology: Proton Pump Inhibitors 2 Passed - 04/22/2024 12:14 PM      Passed - ALT in normal range and within 360 days    ALT  Date Value Ref Range Status  04/18/2024 15 0 - 32 IU/L Final   SGPT (ALT)  Date Value Ref Range Status  03/07/2013 26 12 - 78 U/L Final         Passed - AST in normal range and within 360 days    AST  Date Value Ref Range Status  04/18/2024 24 0 - 40 IU/L Final   SGOT(AST)  Date Value Ref Range Status  03/07/2013 21 15 - 37 Unit/L Final         Passed - Valid encounter within last 12 months    Recent Outpatient Visits           4 days ago Essential (primary) hypertension   [place] [facility], Leita DEL, MD   5 months ago Type II diabetes mellitus with complication [facility])   [place] [facility] at  [facility], Leita DEL, MD   9 months ago Type II diabetes mellitus with complication [facility])   [place] [facility] at [facility], Leita DEL, MD   3 years ago Encounter for medication review   [facility] [place]. [facility] Fle[facility] Morris, Stephen L, [name]               cyclobenzaprine  (FLEXERIL ) 10 MG tablet 270 tablet 0    Sig: Take 1 tablet by mouth three times daily.     Not Delegated - Analgesics:  Muscle Relaxants Failed - 04/22/2024 12:14 PM      Failed - This refill cannot be delegated      Passed - Valid encounter within last 6 months    Recent Outpatient Visits           4 days ago Essential (primary) hypertension   [place] [facility] at [facility], Leita DEL, MD  5 months ago Type II diabetes mellitus with complication [facility])   [place] [facility] at [facility], Leita DEL, MD   9 months ago Type II diabetes mellitus with complication [facility])   [place] [facility] at [facility], Leita DEL, MD   3 years ago Encounter for medication review   [facility] [place]. [facility] Fleeta Tonia Garnette LITTIE, [name]

## 2024-04-22 NOTE — Telephone Encounter (Signed)
 Requested medications are due for refill today.  Tamoxifen  is - Cyclobenzaprine  is too soon  Requested medications are on the active medications list.  yes  Last refill. Tamoxifen  01/22/2024  #90 0 rf, Cyclobenzaprine  02/09/2024 #270 0 rf  Future visit scheduled.   yes  Notes to clinic.  Refill.refusal not delegated.   Requested Prescriptions  Pending Prescriptions Disp Refills   tamoxifen  (NOLVADEX ) 20 MG tablet 90 tablet 0    Sig: Take 1 tablet by mouth daily.     Off-Protocol Failed - 04/22/2024 12:14 PM      Failed - Medication not assigned to a protocol, review manually.      Passed - Valid encounter within last 12 months    Recent Outpatient Visits           4 days ago Essential (primary) hypertension   [place] [facility] at [facility], Leita DEL, MD   5 months ago Type II diabetes mellitus with complication [facility])   [place] [facility] at [facility], Leita DEL, MD   9 months ago Type II diabetes mellitus with complication [facility])   [place] [facility] at [facility], Leita DEL, MD   3 years ago Encounter for medication review   [facility] Dept Of [place]. [facility] Fleeta Morris, Stephen L, [name]               cyclobenzaprine  (FLEXERIL ) 10 MG tablet 270 tablet 0    Sig: Take 1 tablet by mouth three times daily.     Not Delegated - Analgesics:  Muscle Relaxants Failed - 04/22/2024 12:14 PM      Failed - This refill cannot be delegated      Passed - Valid encounter within last 6 months    Recent Outpatient Visits           4 days ago Essential (primary) hypertension   [place] [facility] at [facility], Leita DEL, MD   5 months ago Type II diabetes mellitus with complication [facility])   [place] [facility] at [facility], Leita DEL, MD   9 months ago Type  II diabetes mellitus with complication [facility])   [place] [facility] at [facility], Leita DEL, MD   3 years ago Encounter for medication review   [facility] Dept Of [place]. [facility] Fleeta Morris, Garnette CROME, [name]              Signed Prescriptions Disp Refills   esomeprazole  (NEXIUM ) 40 MG capsule 90 capsule 1    Sig: Take 1 capsule by mouth daily before supper at 5 PM.     Gastroenterology: Proton Pump Inhibitors 2 Passed - 04/22/2024 12:14 PM      Passed - ALT in normal range and within 360 days    ALT  Date Value Ref Range Status  04/18/2024 15 0 - 32 IU/L Final   SGPT (ALT)  Date Value Ref Range Status  03/07/2013 26 12 - 78 U/L Final         Passed - AST in normal range and within 360 days    AST  Date Value Ref Range Status  04/18/2024 24 0 - 40 IU/L Final   SGOT(AST)  Date Value Ref Range Status  03/07/2013  21 15 - 37 Unit/L Final         Passed - Valid encounter within last 12 months    Recent Outpatient Visits           4 days ago Essential (primary) hypertension   [place] [facility] at [facility], Leita DEL, MD   5 months ago Type II diabetes mellitus with complication [facility])   [place] [facility] at [facility], Leita DEL, MD   9 months ago Type II diabetes mellitus with complication [facility])   [place] [facility] at [facility], Leita DEL, MD   3 years ago Encounter for medication review   [facility] [place] [facility] Dept Of [place]. [facility] Fleeta Tonia Garnette LITTIE, [name]

## 2024-05-02 ENCOUNTER — Other Ambulatory Visit: Payer: Self-pay | Admitting: Internal Medicine

## 2024-05-02 ENCOUNTER — Encounter: Payer: Self-pay | Admitting: Internal Medicine

## 2024-05-02 DIAGNOSIS — E118 Type 2 diabetes mellitus with unspecified complications: Secondary | ICD-10-CM

## 2024-05-02 DIAGNOSIS — M10079 Idiopathic gout, unspecified ankle and foot: Secondary | ICD-10-CM

## 2024-05-02 MED ORDER — INDOMETHACIN 50 MG PO CAPS
50.0000 mg | ORAL_CAPSULE | Freq: Three times a day (TID) | ORAL | 0 refills | Status: AC
Start: 2024-05-02 — End: ?

## 2024-05-02 NOTE — Progress Notes (Unsigned)
 Date:  05/02/2024   Name:  [name]   DOB:  [birth year]   MRN:  [id number]   Chief Complaint: No chief complaint on file.  HPI  Review of Systems   Lab Results  Component Value Date   NA 140 04/18/2024   K 4.4 04/18/2024   CO2 24 04/18/2024   GLUCOSE 122 (H) 04/18/2024   BUN 10 04/18/2024   CREATININE 0.72 04/18/2024   CALCIUM  9.1 04/18/2024   EGFR 93 04/18/2024   GFRNONAA 104 11/21/2019   Lab Results  Component Value Date   CHOL 172 07/20/2023   HDL 47 07/20/2023   LDLCALC 80 07/20/2023   TRIG 273 (H) 07/20/2023   CHOLHDL 3.7 07/20/2023   Lab Results  Component Value Date   TSH 3.990 07/20/2023   Lab Results  Component Value Date   HGBA1C 6.1 (H) 04/18/2024   Lab Results  Component Value Date   WBC 10.3 11/07/2022   HGB 13.2 11/07/2022   HCT 39.5 11/07/2022   MCV 94 11/07/2022   PLT 304 11/07/2022   Lab Results  Component Value Date   ALT 15 04/18/2024   AST 24 04/18/2024   ALKPHOS 92 04/18/2024   BILITOT 0.2 04/18/2024   Lab Results  Component Value Date   VD25OH 17.4 (L) 11/07/2022     Patient Active Problem List   Diagnosis Date Noted   Vitamin D  deficiency 11/18/2023   Flexor tenosynovitis of finger 01/17/2022   S/P hysterectomy with oophorectomy 05/19/2019   BRCA2 gene mutation positive 03/03/2019   Type II diabetes mellitus with complication (HCC) 09/16/2016   Insomnia 09/16/2016   Arthritis of carpometacarpal (CMC) joint of thumb 05/29/2016   Venous insufficiency of both lower extremities 01/17/2016   Acquired hypothyroidism 11/10/2014   Essential (primary) hypertension 11/10/2014   Fibromyalgia syndrome 11/10/2014   Mixed hyperlipidemia 11/10/2014   Irritable bowel syndrome with diarrhea 11/10/2014   Asthma, mild intermittent 11/10/2014   BMI 40.0-44.9, adult (HCC) 11/10/2014   Chronic peptic ulcer 11/10/2014   Psoriasis 11/10/2014   Obstructive apnea 11/10/2014   Environmental and seasonal allergies 09/08/2011     Allergies  Allergen Reactions   Sulfa Antibiotics Other (See Comments)    seizure   Amlodipine Swelling    Leg swelling   Furosemide Swelling   Lyrica [Pregabalin] Other (See Comments)    Dizziness and blurred vision   Hydrochlorothiazide Itching and Rash    Past Surgical History:  Procedure Laterality Date   APPENDECTOMY     BREAST BIOPSY Left 2007   benign, dr Dessa   CATARACT EXTRACTION W/PHACO Right 11/26/2023   Procedure: PHACOEMULSIFICATION, CATARACT, WITH IOL INSERTION 19.41, 01:43.6;  Surgeon: Enola Feliciano Hugger, MD;  Location: [facility];  Service: Ophthalmology;  Laterality: Right;   CHOLECYSTECTOMY     CYSTOSCOPY  05/19/2019   Procedure: CYSTOSCOPY;  Surgeon: Victor Claudell SAUNDERS, MD;  Location: [facility];  Service: Gynecology;;   HYSTEROSCOPY WITH D & C N/A 12/14/2018   Procedure: DILATATION AND CURETTAGE /HYSTEROSCOPY WITH MYOSURE, VULVAR BIOPSY;  Surgeon: Victor Claudell SAUNDERS, MD;  Location: [facility] OR[facility];  Service: Gynecology;  Laterality: N/A;   SKIN BIOPSY Left    Back on L leg    SKIN CANCER EXCISION     BCCA of face   TONSILLECTOMY AND ADENOIDECTOMY     TOTAL LAPAROSCOPIC HYSTERECTOMY WITH SALPINGECTOMY N/A 05/19/2019   Procedure: TOTAL LAPAROSCOPIC HYSTERECTOMY WITH SALPINGECTOMY/ TLH/BSO;  Surgeon: Victor Claudell SAUNDERS, MD;  Location: [facility];  Service: Gynecology;  Laterality: N/A;    Social History   Tobacco Use   Smoking status: Never   Smokeless tobacco: Never  Vaping Use   Vaping status: Never Used  Substance Use Topics   Alcohol use: Yes    Alcohol/week: 2.0 standard drinks of alcohol    Types: 2 Standard drinks or equivalent per week    Comment: rarely   Drug use: No     Medication list has been reviewed and updated.  No outpatient medications have been marked as taking for the 05/02/24 encounter (Orders Only) with [name], MD.       04/18/2024    3:03 PM 11/18/2023   10:59 AM 07/20/2023    3:03 PM 11/07/2022     2:44 PM  GAD 7 : Generalized Anxiety Score  Nervous, Anxious, on Edge 1 0 0 0  Control/stop worrying 1 0 0 0  Worry too much - different things 1 0 0 0  Trouble relaxing 1 2 0 0  Restless 1 0 0 0  Easily annoyed or irritable 0 2 2 0  Afraid - awful might happen 0 0 0 0  Total GAD 7 Score 5 4 2  0  Anxiety Difficulty Somewhat difficult Not difficult at all Not difficult at all Not difficult at all       04/18/2024    3:03 PM 11/18/2023   10:58 AM 07/20/2023    3:03 PM  Depression screen PHQ 2/9  Decreased Interest 1 2 2   Down, Depressed, Hopeless 0 0 0  PHQ - 2 Score 1 2 2   Altered sleeping 1 3 3   Tired, decreased energy 1 3 2   Change in appetite 0 0 1  Feeling bad or failure about yourself  0 0 0  Trouble concentrating 0 1 1  Moving slowly or fidgety/restless 0 0 0  Suicidal thoughts 0 0 0  PHQ-9 Score 3 9  9    Difficult doing work/chores Not difficult at all Somewhat difficult Somewhat difficult     Data saved with a previous flowsheet row definition    BP Readings from Last 3 Encounters:  04/18/24 122/80  04/18/24 128/79  11/26/23 (!) 175/73    Physical Exam  Wt Readings from Last 3 Encounters:  04/18/24 221 lb (100.2 kg)  04/18/24 221 lb 3.2 oz (100.3 kg)  11/26/23 215 lb (97.5 kg)    There were no vitals taken for this visit.  Assessment and Plan:  Problem List Items Addressed This Visit   None   No follow-ups on file.    Leita HILARIO Justus, MD [facility] [place]

## 2024-05-02 NOTE — Telephone Encounter (Signed)
 Please review. Medication not on current list.  KP

## 2024-05-03 NOTE — Telephone Encounter (Signed)
 Requested medications are due for refill today.  yes  Requested medications are on the active medications list.  yes  Last refill. 02/01/2024 #180 0 rf  Future visit scheduled.   no  Notes to clinic.  Labs are expired. [place]    Requested Prescriptions  Pending Prescriptions Disp Refills   metFORMIN  (GLUCOPHAGE -XR) 500 MG 24 hr tablet [Pharmacy Med Name: metFORMIN  HCl ER 500 MG Oral Tablet Extended Release 24 Hour] 180 tablet 0    Sig: TAKE 1 TABLET BY MOUTH TWICE DAILY WITH A MEAL     Endocrinology:  Diabetes - Biguanides Failed - 05/03/2024  2:56 PM      Failed - B12 Level in normal range and within 720 days    No results found for: VITAMINB12       Failed - CBC within normal limits and completed in the last 12 months    WBC  Date Value Ref Range Status  11/07/2022 10.3 3.4 - 10.8 x10E3/uL Final  05/19/2019 17.0 (H) 4.0 - 10.5 K/uL Final   RBC  Date Value Ref Range Status  11/07/2022 4.21 3.77 - 5.28 x10E6/uL Final  05/19/2019 4.04 3.87 - 5.11 MIL/uL Final   Hemoglobin  Date Value Ref Range Status  11/07/2022 13.2 11.1 - 15.9 g/dL Final   Hematocrit  Date Value Ref Range Status  11/07/2022 39.5 34.0 - 46.6 % Final   MCHC  Date Value Ref Range Status  11/07/2022 33.4 31.5 - 35.7 g/dL Final  87/89/7979 66.2 30.0 - 36.0 g/dL Final   [facility]  Date Value Ref Range Status  11/07/2022 31.4 26.6 - 33.0 pg Final  05/19/2019 31.2 26.0 - 34.0 pg Final   MCV  Date Value Ref Range Status  11/07/2022 94 79 - 97 fL Final  08/21/2013 95 80 - 100 fL Final   No results found for: PLTCOUNTKUC, LABPLAT, POCPLA RDW  Date Value Ref Range Status  11/07/2022 12.4 11.7 - 15.4 % Final  08/21/2013 13.8 11.5 - 14.5 % Final         Passed - Cr in normal range and within 360 days    Creatinine  Date Value Ref Range Status  08/21/2013 0.66 0.60 - 1.30 mg/dL Final   Creatinine, Ser  Date Value Ref Range Status  04/18/2024 0.72 0.57 - 1.00 mg/dL Final          Passed - HBA1C is between 0 and 7.9 and within 180 days    Hgb A1c MFr Bld  Date Value Ref Range Status  04/18/2024 6.1 (H) 4.8 - 5.6 % Final    Comment:             Prediabetes: 5.7 - 6.4          Diabetes: >6.4          Glycemic control for adults with diabetes: <7.0          Passed - eGFR in normal range and within 360 days    EGFR (African American)  Date Value Ref Range Status  08/21/2013 >60  Final   GFR calc Af Amer  Date Value Ref Range Status  11/21/2019 119 >59 mL/min/1.73 Final    Comment:    **[facility] currently reports eGFR in compliance with the current**   recommendations of the [employer]. [facility] will   update reporting as new guidelines are published from the NKF-ASN   Task force.    EGFR (Non-African Amer.)  Date Value Ref Range Status  08/21/2013 >60  Final    Comment:    eGFR values <59mL/min/1.73 m2 may be an indication of chronic kidney disease (CKD). Calculated eGFR is useful in patients with stable renal function. The eGFR calculation will not be reliable in acutely ill patients when serum creatinine is changing rapidly. It is not useful in  patients on dialysis. The eGFR calculation may not be applicable to patients at the low and high extremes of body sizes, pregnant women, and vegetarians.    GFR calc non Af Amer  Date Value Ref Range Status  11/21/2019 104 >59 mL/min/1.73 Final   eGFR  Date Value Ref Range Status  04/18/2024 93 >59 mL/min/1.73 Final         Passed - Valid encounter within last 6 months    Recent Outpatient Visits           2 weeks ago Essential (primary) hypertension   [place] [facility] at [facility], Leita DEL, MD   5 months ago Type II diabetes mellitus with complication [facility])   [place] [facility] at [facility], Leita DEL, MD   9 months ago Type II diabetes mellitus with complication [facility])   [place] [facility] at [place], Leita DEL, MD   4 years ago Encounter for medication review   [facility] Dept Of [place]. [facility] Fleeta Tonia Garnette LITTIE, [name]

## 2024-05-04 ENCOUNTER — Ambulatory Visit

## 2024-05-22 ENCOUNTER — Other Ambulatory Visit: Payer: Self-pay | Admitting: Internal Medicine

## 2024-05-22 DIAGNOSIS — J3089 Other allergic rhinitis: Secondary | ICD-10-CM

## 2024-05-24 ENCOUNTER — Other Ambulatory Visit: Payer: Self-pay | Admitting: Internal Medicine

## 2024-05-24 DIAGNOSIS — I10 Essential (primary) hypertension: Secondary | ICD-10-CM

## 2024-05-25 DIAGNOSIS — B359 Dermatophytosis, unspecified: Secondary | ICD-10-CM | POA: Diagnosis not present

## 2024-05-25 DIAGNOSIS — M109 Gout, unspecified: Secondary | ICD-10-CM | POA: Diagnosis not present

## 2024-05-25 NOTE — Telephone Encounter (Signed)
 Requested Prescriptions  Pending Prescriptions Disp Refills   montelukast  (SINGULAIR ) 10 MG tablet [Pharmacy Med Name: Montelukast  Sodium 10mg  Tablet] 90 tablet 0    Sig: Take 1 tablet by mouth every night at bedtime.     Pulmonology:  Leukotriene Inhibitors Passed - 05/25/2024  9:39 AM      Passed - Valid encounter within last 12 months    Recent Outpatient Visits           1 month ago Essential (primary) hypertension   [place] [facility] at [facility], Leita DEL, MD   6 months ago Type II diabetes mellitus with complication [facility])   [place] [facility] at [facility], Leita DEL, MD   10 months ago Type II diabetes mellitus with complication [facility])   [place] [facility] at [facility], Leita DEL, MD   4 years ago Encounter for medication review   [place] [facility] [place] - [facility] Dept Of [place]. [facility] Fleeta Tonia Garnette LITTIE, [name]

## 2024-05-27 NOTE — Telephone Encounter (Signed)
 Requested Prescriptions  Pending Prescriptions Disp Refills   doxazosin  (CARDURA ) 4 MG tablet 90 tablet 0    Sig: Take 1 tablet by mouth daily.     Cardiovascular:  Alpha Blockers Passed - 05/27/2024 11:44 AM      Passed - Last BP in normal range    BP Readings from Last 1 Encounters:  04/18/24 122/80         Passed - Valid encounter within last 6 months    Recent Outpatient Visits           1 month ago Essential (primary) hypertension   [place] [facility] at [facility], Leita DEL, MD   6 months ago Type II diabetes mellitus with complication [facility])   [place] [facility] at [facility], Leita DEL, MD   10 months ago Type II diabetes mellitus with complication [facility])   [place] [facility] at [facility], Leita DEL, MD   4 years ago Encounter for medication review   [place] [facility] A Dept Of [place]. [facility] Fleeta Tonia Garnette LITTIE, [name]

## 2024-06-07 ENCOUNTER — Encounter: Payer: Self-pay | Admitting: Internal Medicine

## 2024-06-07 ENCOUNTER — Other Ambulatory Visit: Payer: Self-pay | Admitting: Internal Medicine

## 2024-06-07 DIAGNOSIS — F5101 Primary insomnia: Secondary | ICD-10-CM

## 2024-06-07 DIAGNOSIS — I10 Essential (primary) hypertension: Secondary | ICD-10-CM

## 2024-06-07 MED ORDER — LISINOPRIL 40 MG PO TABS: 40.0000 mg | ORAL_TABLET | Freq: Every day | ORAL | 0 refills | Status: AC

## 2024-06-07 MED ORDER — DOXAZOSIN MESYLATE 4 MG PO TABS: 4.0000 mg | ORAL_TABLET | Freq: Every day | ORAL | 0 refills | Status: AC

## 2024-06-07 MED ORDER — NEBIVOLOL HCL 20 MG PO TABS: 1.0000 | ORAL_TABLET | Freq: Every day | ORAL | 0 refills | Status: DC

## 2024-06-07 MED ORDER — ZOLPIDEM TARTRATE 5 MG PO TABS: 5.0000 mg | ORAL_TABLET | Freq: Every evening | ORAL | 0 refills | Status: AC | PRN

## 2024-06-07 NOTE — Progress Notes (Unsigned)
 "   Date:  06/07/2024   Name:  [name]   DOB:  27-Mar-1961   MRN:  [id number]   Chief Complaint: No chief complaint on file.  HPI  Review of Systems   Lab Results  Component Value Date   NA 140 04/18/2024   K 4.4 04/18/2024   CO2 24 04/18/2024   GLUCOSE 122 (H) 04/18/2024   BUN 10 04/18/2024   CREATININE 0.72 04/18/2024   CALCIUM  9.1 04/18/2024   EGFR 93 04/18/2024   GFRNONAA 104 11/21/2019   Lab Results  Component Value Date   CHOL 172 07/20/2023   HDL 47 07/20/2023   LDLCALC 80 07/20/2023   TRIG 273 (H) 07/20/2023   CHOLHDL 3.7 07/20/2023   Lab Results  Component Value Date   TSH 3.990 07/20/2023   Lab Results  Component Value Date   HGBA1C 6.1 (H) 04/18/2024   Lab Results  Component Value Date   WBC 10.3 11/07/2022   HGB 13.2 11/07/2022   HCT 39.5 11/07/2022   MCV 94 11/07/2022   PLT 304 11/07/2022   Lab Results  Component Value Date   ALT 15 04/18/2024   AST 24 04/18/2024   ALKPHOS 92 04/18/2024   BILITOT 0.2 04/18/2024   Lab Results  Component Value Date   VD25OH 17.4 (L) 11/07/2022     Patient Active Problem List   Diagnosis Date Noted   Vitamin D  deficiency 11/18/2023   Flexor tenosynovitis of finger 01/17/2022   S/P hysterectomy with oophorectomy 05/19/2019   BRCA2 gene mutation positive 03/03/2019   Type II diabetes mellitus with complication (HCC) 09/16/2016   Insomnia 09/16/2016   Arthritis of carpometacarpal (CMC) joint of thumb 05/29/2016   Venous insufficiency of both lower extremities 01/17/2016   Acquired hypothyroidism 11/10/2014   Essential (primary) hypertension 11/10/2014   Fibromyalgia syndrome 11/10/2014   Mixed hyperlipidemia 11/10/2014   Irritable bowel syndrome with diarrhea 11/10/2014   Asthma, mild intermittent 11/10/2014   BMI 40.0-44.9, adult (HCC) 11/10/2014   Chronic peptic ulcer 11/10/2014   Psoriasis 11/10/2014   Obstructive apnea 11/10/2014   Environmental and seasonal allergies 09/08/2011     Allergies[1]  Past Surgical History:  Procedure Laterality Date   APPENDECTOMY     BREAST BIOPSY Left 2007   benign, dr Dessa   CATARACT EXTRACTION W/PHACO Right 11/26/2023   Procedure: PHACOEMULSIFICATION, CATARACT, WITH IOL INSERTION 19.41, 01:43.6;  Surgeon: Enola Feliciano Hugger, MD;  Location: [facility];  Service: Ophthalmology;  Laterality: Right;   CHOLECYSTECTOMY     CYSTOSCOPY  05/19/2019   Procedure: CYSTOSCOPY;  Surgeon: Victor Claudell SAUNDERS, MD;  Location: [facility];  Service: Gynecology;;   HYSTEROSCOPY WITH D & C N/A 12/14/2018   Procedure: DILATATION AND CURETTAGE /HYSTEROSCOPY WITH MYOSURE, VULVAR BIOPSY;  Surgeon: Victor Claudell SAUNDERS, MD;  Location: [facility];  Service: Gynecology;  Laterality: N/A;   SKIN BIOPSY Left    Back on L leg    SKIN CANCER EXCISION     BCCA of face   TONSILLECTOMY AND ADENOIDECTOMY     TOTAL LAPAROSCOPIC HYSTERECTOMY WITH SALPINGECTOMY N/A 05/19/2019   Procedure: TOTAL LAPAROSCOPIC HYSTERECTOMY WITH SALPINGECTOMY/ TLH/BSO;  Surgeon: Victor Claudell SAUNDERS, MD;  Location: [facility];  Service: Gynecology;  Laterality: N/A;    Social History[2]   Medication list has been reviewed and updated.  Active Medications[3]     04/18/2024    3:03 PM 11/18/2023   10:59 AM 07/20/2023    3:03 PM 11/07/2022    2:44  PM  GAD 7 : Generalized Anxiety Score  Nervous, Anxious, on Edge 1 0 0 0  Control/stop worrying 1 0 0 0  Worry too much - different things 1 0 0 0  Trouble relaxing 1 2 0 0  Restless 1 0 0 0  Easily annoyed or irritable 0 2 2 0  Afraid - awful might happen 0 0 0 0  Total GAD 7 Score 5 4 2  0  Anxiety Difficulty Somewhat difficult Not difficult at all Not difficult at all Not difficult at all       04/18/2024    3:03 PM 11/18/2023   10:58 AM 07/20/2023    3:03 PM  Depression screen PHQ 2/9  Decreased Interest 1 2 2   Down, Depressed, Hopeless 0 0 0  PHQ - 2 Score 1 2 2   Altered sleeping 1 3 3   Tired, decreased  energy 1 3 2   Change in appetite 0 0 1  Feeling bad or failure about yourself  0 0 0  Trouble concentrating 0 1 1  Moving slowly or fidgety/restless 0 0 0  Suicidal thoughts 0 0 0  PHQ-9 Score 3 9  9    Difficult doing work/chores Not difficult at all Somewhat difficult Somewhat difficult     Data saved with a previous flowsheet row definition    BP Readings from Last 3 Encounters:  04/18/24 122/80  04/18/24 128/79  11/26/23 (!) 175/73    Physical Exam  Wt Readings from Last 3 Encounters:  04/18/24 221 lb (100.2 kg)  04/18/24 221 lb 3.2 oz (100.3 kg)  11/26/23 215 lb (97.5 kg)    There were no vitals taken for this visit.  Assessment and Plan:  Problem List Items Addressed This Visit   None   No follow-ups on file.    Leita HILARIO Adie, MD [place] [facility] [place]           [1]  Allergies Allergen Reactions   Sulfa Antibiotics Other (See Comments)    seizure   Amlodipine Swelling    Leg swelling   Furosemide Swelling   Lyrica [Pregabalin] Other (See Comments)    Dizziness and blurred vision   Hydrochlorothiazide Itching and Rash  [2]  Social History Tobacco Use   Smoking status: Never   Smokeless tobacco: Never  Vaping Use   Vaping status: Never Used  Substance Use Topics   Alcohol use: Yes    Alcohol/week: 2.0 standard drinks of alcohol    Types: 2 Standard drinks or equivalent per week    Comment: rarely   Drug use: No  [3]  No outpatient medications have been marked as taking for the 06/07/24 encounter (Orders Only) with Adie Leita DEL, MD.   "

## 2024-06-07 NOTE — Telephone Encounter (Signed)
 Please send RX if appropriate.  Thank you

## 2024-06-23 ENCOUNTER — Encounter: Payer: Self-pay | Admitting: Student

## 2024-06-23 DIAGNOSIS — E039 Hypothyroidism, unspecified: Secondary | ICD-10-CM

## 2024-06-23 MED ORDER — LEVOTHYROXINE SODIUM 200 MCG PO TABS: 200.0000 ug | ORAL_TABLET | Freq: Every day | ORAL | 1 refills | Status: AC

## 2024-06-29 ENCOUNTER — Other Ambulatory Visit: Payer: Self-pay

## 2024-06-29 DIAGNOSIS — E118 Type 2 diabetes mellitus with unspecified complications: Secondary | ICD-10-CM

## 2024-06-29 MED ORDER — METFORMIN HCL ER 500 MG PO TB24: 500.0000 mg | ORAL_TABLET | Freq: Two times a day (BID) | ORAL | 0 refills | Status: AC

## 2024-07-07 ENCOUNTER — Other Ambulatory Visit: Payer: Self-pay

## 2024-07-07 DIAGNOSIS — I10 Essential (primary) hypertension: Secondary | ICD-10-CM

## 2024-07-07 MED ORDER — NEBIVOLOL HCL 20 MG PO TABS: 1.0000 | ORAL_TABLET | Freq: Every day | ORAL | 0 refills | Status: AC

## 2024-07-19 ENCOUNTER — Ambulatory Visit: Admitting: Student

## 2025-04-20 ENCOUNTER — Ambulatory Visit
